# Patient Record
Sex: Female | Born: 1965 | Race: White | Hispanic: No | Marital: Married | Smoking: Former smoker
Health system: Southern US, Community
[De-identification: ages and names within clinical notes are randomized; demographics above are authoritative.]

## PROBLEM LIST (undated history)

## (undated) DIAGNOSIS — N6019 Diffuse cystic mastopathy of unspecified breast: Secondary | ICD-10-CM

## (undated) DIAGNOSIS — D649 Anemia, unspecified: Secondary | ICD-10-CM

## (undated) DIAGNOSIS — I340 Nonrheumatic mitral (valve) insufficiency: Secondary | ICD-10-CM

## (undated) DIAGNOSIS — G709 Myoneural disorder, unspecified: Secondary | ICD-10-CM

## (undated) DIAGNOSIS — I8390 Asymptomatic varicose veins of unspecified lower extremity: Secondary | ICD-10-CM

## (undated) DIAGNOSIS — R011 Cardiac murmur, unspecified: Secondary | ICD-10-CM

## (undated) HISTORY — DX: Nonrheumatic mitral (valve) insufficiency: I34.0

## (undated) HISTORY — DX: Diffuse cystic mastopathy of unspecified breast: N60.19

## (undated) HISTORY — PX: BREAST LUMPECTOMY: SHX2

## (undated) HISTORY — PX: BREAST BIOPSY: SHX20

## (undated) HISTORY — PX: VARICOSE VEIN SURGERY: SHX832

## (undated) HISTORY — PX: WISDOM TOOTH EXTRACTION: SHX21

---

## 1997-09-30 ENCOUNTER — Other Ambulatory Visit: Admission: RE | Admit: 1997-09-30 | Discharge: 1997-09-30 | Payer: Self-pay | Admitting: Family Medicine

## 1998-12-22 ENCOUNTER — Other Ambulatory Visit: Admission: RE | Admit: 1998-12-22 | Discharge: 1998-12-22 | Payer: Self-pay | Admitting: Obstetrics and Gynecology

## 2000-04-30 ENCOUNTER — Other Ambulatory Visit: Admission: RE | Admit: 2000-04-30 | Discharge: 2000-04-30 | Payer: Self-pay | Admitting: Obstetrics and Gynecology

## 2000-09-12 ENCOUNTER — Other Ambulatory Visit: Admission: RE | Admit: 2000-09-12 | Discharge: 2000-09-12 | Payer: Self-pay | Admitting: Obstetrics and Gynecology

## 2001-07-16 ENCOUNTER — Other Ambulatory Visit: Admission: RE | Admit: 2001-07-16 | Discharge: 2001-07-16 | Payer: Self-pay | Admitting: Obstetrics and Gynecology

## 2002-10-22 ENCOUNTER — Other Ambulatory Visit: Admission: RE | Admit: 2002-10-22 | Discharge: 2002-10-22 | Payer: Self-pay | Admitting: Obstetrics and Gynecology

## 2002-11-18 ENCOUNTER — Encounter: Payer: Self-pay | Admitting: General Surgery

## 2002-11-18 ENCOUNTER — Encounter: Admission: RE | Admit: 2002-11-18 | Discharge: 2002-11-18 | Payer: Self-pay | Admitting: General Surgery

## 2002-12-09 ENCOUNTER — Ambulatory Visit (HOSPITAL_BASED_OUTPATIENT_CLINIC_OR_DEPARTMENT_OTHER): Admission: RE | Admit: 2002-12-09 | Discharge: 2002-12-09 | Payer: Self-pay | Admitting: General Surgery

## 2002-12-09 ENCOUNTER — Ambulatory Visit (HOSPITAL_COMMUNITY): Admission: RE | Admit: 2002-12-09 | Discharge: 2002-12-09 | Payer: Self-pay | Admitting: General Surgery

## 2003-10-28 ENCOUNTER — Other Ambulatory Visit: Admission: RE | Admit: 2003-10-28 | Discharge: 2003-10-28 | Payer: Self-pay | Admitting: Obstetrics and Gynecology

## 2004-01-11 ENCOUNTER — Encounter: Admission: RE | Admit: 2004-01-11 | Discharge: 2004-01-11 | Payer: Self-pay | Admitting: Family Medicine

## 2004-07-17 ENCOUNTER — Emergency Department (HOSPITAL_COMMUNITY): Admission: EM | Admit: 2004-07-17 | Discharge: 2004-07-17 | Payer: Self-pay | Admitting: Family Medicine

## 2004-12-27 ENCOUNTER — Other Ambulatory Visit: Admission: RE | Admit: 2004-12-27 | Discharge: 2004-12-27 | Payer: Self-pay | Admitting: Obstetrics and Gynecology

## 2006-04-15 ENCOUNTER — Emergency Department (HOSPITAL_COMMUNITY): Admission: EM | Admit: 2006-04-15 | Discharge: 2006-04-15 | Payer: Self-pay | Admitting: Family Medicine

## 2010-10-18 ENCOUNTER — Other Ambulatory Visit: Payer: Self-pay | Admitting: Obstetrics and Gynecology

## 2011-09-17 ENCOUNTER — Other Ambulatory Visit (HOSPITAL_COMMUNITY): Payer: Self-pay | Admitting: Legal Medicine

## 2011-09-17 ENCOUNTER — Ambulatory Visit (HOSPITAL_COMMUNITY): Payer: 59 | Attending: Cardiology | Admitting: Radiology

## 2011-09-17 DIAGNOSIS — R011 Cardiac murmur, unspecified: Secondary | ICD-10-CM

## 2011-09-17 DIAGNOSIS — I059 Rheumatic mitral valve disease, unspecified: Secondary | ICD-10-CM | POA: Insufficient documentation

## 2011-09-17 NOTE — Progress Notes (Signed)
Echocardiogram performed.  

## 2011-11-14 ENCOUNTER — Encounter: Payer: Self-pay | Admitting: Cardiovascular Disease

## 2011-11-14 ENCOUNTER — Encounter: Payer: Self-pay | Admitting: *Deleted

## 2011-11-14 ENCOUNTER — Ambulatory Visit (INDEPENDENT_AMBULATORY_CARE_PROVIDER_SITE_OTHER): Payer: 59 | Admitting: Cardiovascular Disease

## 2011-11-14 VITALS — BP 120/75 | HR 67 | Ht 69.0 in | Wt 148.0 lb

## 2011-11-14 DIAGNOSIS — I340 Nonrheumatic mitral (valve) insufficiency: Secondary | ICD-10-CM | POA: Insufficient documentation

## 2011-11-14 DIAGNOSIS — I059 Rheumatic mitral valve disease, unspecified: Secondary | ICD-10-CM

## 2011-11-14 NOTE — Progress Notes (Signed)
   History of Present Illness: 46 yo WF with history of fibrocystic breast disease but no other chronic medical problems who is here today for new patient evaluation. She has no history of DM, HTN, HLD. She exercises several times per week, walking or jogging a few miles. She was being evaluated in primary care this summer and a heart murmur was noticed. Echo 09/17/11 with mild MR, normal LV size and function.   She tells me that she feels great. She has no chest pain, SOB, dizziness, near syncope, syncope, palpitations.   Primary Care Physician: [place]  Past Medical History  Diagnosis Date  . Fibrocystic breast disease   . Mitral regurgitation     Mild by echo 09/17/11.    Past Surgical History  Procedure Date  . Breast lumpectomy     Fibroadenoma of the left breast  . Breast biopsy   . Breast biopsy   . Varicose vein surgery     with lasor R-leg    No current outpatient prescriptions on file.  No meds  No Known Allergies  History   Social History  . Marital Status: Married    Spouse Name: N/A    Number of Children: 2  . Years of Education: N/A   Occupational History  . Gaffer    Social History Main Topics  . Smoking status: Former Smoker -- 1.0 packs/day for 20 years    Types: Cigarettes    Quit date: 03/19/2002  . Smokeless tobacco: Not on file  . Alcohol Use: No  . Drug Use: No  . Sexually Active: Not on file   Other Topics Concern  . Not on file   Social History Narrative  . No narrative on file    Family History  Problem Relation Age of Onset  . Cancer Father     Lung or esphageal  . Hypertension Mother   . Diabetes Mother   . Coronary artery disease Paternal Uncle     Review of Systems:  As stated in the HPI and otherwise negative.   BP 120/75  Pulse 67  Ht 5\' 9"  (1.753 m)  Wt 148 lb (67.132 kg)  BMI 21.86 kg/m2  Physical Examination: General: Well developed, well nourished, NAD HEENT: OP clear, mucus membranes  moist SKIN: warm, dry. No rashes. Neuro: No focal deficits Musculoskeletal: Muscle strength 5/5 all ext Psychiatric: Mood and affect normal Neck: No JVD, no carotid bruits, no thyromegaly, no lymphadenopathy. Lungs:Clear bilaterally, no wheezes, rhonci, crackles Cardiovascular: Regular rate and rhythm. Soft systolic murmur. no gallops or rubs. Abdomen:Soft. Bowel sounds present. Non-tender.  Extremities: No lower extremity edema. Pulses are 2 + in the bilateral DP/PT.  EKG: Normal sinus, rate 67 bpm. Normal EKG.   Echo 09/17/11: Left ventricle: The cavity size was normal. Wall thickness was normal. Systolic function was normal. The estimated ejection fraction was in the range of 55% to 60%. Wall motion was normal; there were no regional wall motion abnormalities. Left ventricular diastolic function parameters were normal. - Mitral valve: Mild regurgitation.    Assessment and Plan:   1. Mitral Regurgitation: Mild by echo 09/17/11. No further workup at this time. I have recommended 2 year f/u with echo at that time. No antibiotic prophylaxis is necessary prior to dental procedures.

## 2011-11-14 NOTE — Patient Instructions (Addendum)
Your physician wants you to follow-up in:  2 years. You will receive a reminder letter in the mail two months in advance. If you don't receive a letter, please call our office to schedule the follow-up appointment.   

## 2011-12-14 ENCOUNTER — Other Ambulatory Visit: Payer: Self-pay | Admitting: Dermatology

## 2013-03-06 ENCOUNTER — Other Ambulatory Visit: Payer: Self-pay | Admitting: Obstetrics and Gynecology

## 2014-03-10 ENCOUNTER — Other Ambulatory Visit: Payer: Self-pay | Admitting: Obstetrics and Gynecology

## 2014-03-11 LAB — CYTOLOGY - PAP

## 2015-06-14 ENCOUNTER — Other Ambulatory Visit: Payer: Self-pay | Admitting: Obstetrics and Gynecology

## 2015-06-15 LAB — CYTOLOGY - PAP

## 2015-09-23 ENCOUNTER — Other Ambulatory Visit (HOSPITAL_COMMUNITY): Payer: Self-pay

## 2015-09-29 ENCOUNTER — Ambulatory Visit (HOSPITAL_COMMUNITY): Payer: BLUE CROSS/BLUE SHIELD | Attending: Internal Medicine

## 2015-09-29 ENCOUNTER — Encounter (INDEPENDENT_AMBULATORY_CARE_PROVIDER_SITE_OTHER): Payer: Self-pay

## 2015-09-29 ENCOUNTER — Other Ambulatory Visit: Payer: Self-pay

## 2015-09-29 ENCOUNTER — Other Ambulatory Visit (HOSPITAL_COMMUNITY): Payer: Self-pay | Admitting: Nurse Practitioner

## 2015-09-29 DIAGNOSIS — Z87891 Personal history of nicotine dependence: Secondary | ICD-10-CM | POA: Diagnosis not present

## 2015-09-29 DIAGNOSIS — I340 Nonrheumatic mitral (valve) insufficiency: Secondary | ICD-10-CM

## 2015-09-29 DIAGNOSIS — Z8249 Family history of ischemic heart disease and other diseases of the circulatory system: Secondary | ICD-10-CM | POA: Diagnosis not present

## 2015-12-23 ENCOUNTER — Other Ambulatory Visit: Payer: Self-pay | Admitting: Obstetrics and Gynecology

## 2015-12-28 ENCOUNTER — Encounter (HOSPITAL_COMMUNITY): Payer: Self-pay | Admitting: *Deleted

## 2016-01-04 ENCOUNTER — Other Ambulatory Visit: Payer: Self-pay | Admitting: Obstetrics and Gynecology

## 2016-01-12 NOTE — Patient Instructions (Signed)
Your procedure is scheduled on:  Thursday, Nov. 9, 2017  Enter through the [employer] [facility] at:  10:00 AM  Pick up the phone at the desk and dial [phone].  Call this number if you have problems the morning of surgery: [phone].  Remember: Do NOT eat food or  drink after:  Midnight Wednesday, Nov. 8, 2017  Take these medicines the morning of surgery with a SIP OF WATER:  None  Stop ALL herbal medications at this time   Do NOT wear jewelry (body piercing), metal hair clips/bobby pins, make-up, or nail polish. Do NOT wear lotions, powders, or perfumes.  You may wear deodorant. Do NOT shave for 48 hours prior to surgery. Do NOT bring valuables to the hospital. Contacts, dentures, or bridgework may not be worn into surgery.  Have a responsible adult drive you home and stay with you for 24 hours after your procedure

## 2016-01-13 ENCOUNTER — Encounter (HOSPITAL_COMMUNITY): Payer: Self-pay

## 2016-01-13 ENCOUNTER — Encounter (HOSPITAL_COMMUNITY)
Admission: RE | Admit: 2016-01-13 | Discharge: 2016-01-13 | Disposition: A | Discharge status: Home / Self Care | Payer: BLUE CROSS/BLUE SHIELD | Source: Ambulatory Visit | Attending: Obstetrics and Gynecology | Admitting: Obstetrics and Gynecology

## 2016-01-13 DIAGNOSIS — Z01812 Encounter for preprocedural laboratory examination: Secondary | ICD-10-CM | POA: Insufficient documentation

## 2016-01-13 HISTORY — DX: Asymptomatic varicose veins of unspecified lower extremity: I83.90

## 2016-01-13 HISTORY — DX: Anemia, unspecified: D64.9

## 2016-01-13 LAB — CBC
HCT: 27.3 % — ABNORMAL LOW (ref 36.0–46.0)
Hemoglobin: 8.2 g/dL — ABNORMAL LOW (ref 12.0–15.0)
MCH: 22.4 pg — ABNORMAL LOW (ref 26.0–34.0)
MCHC: 30 g/dL (ref 30.0–36.0)
MCV: 74.6 fL — ABNORMAL LOW (ref 78.0–100.0)
Platelets: 345 10*3/uL (ref 150–400)
RBC: 3.66 MIL/uL — ABNORMAL LOW (ref 3.87–5.11)
RDW: 17.3 % — ABNORMAL HIGH (ref 11.5–15.5)
WBC: 8.1 10*3/uL (ref 4.0–10.5)

## 2016-01-13 NOTE — Pre-Procedure Instructions (Signed)
Left message for Stacy at Dr. Elvis Coil office in reference to hemoglobin levels at pre op appointment.

## 2016-01-16 NOTE — Progress Notes (Signed)
Stacy stated Dr. Rogue Bussing aware of hemoglobin.  Ms. Figgers has resumed taking her iron supplement.

## 2016-01-26 ENCOUNTER — Ambulatory Visit (HOSPITAL_COMMUNITY): Payer: BLUE CROSS/BLUE SHIELD | Admitting: Anesthesiology

## 2016-01-26 ENCOUNTER — Encounter (HOSPITAL_COMMUNITY)
Admission: RE | Disposition: A | Discharge status: Home / Self Care | Payer: Self-pay | Source: Ambulatory Visit | Attending: Obstetrics and Gynecology

## 2016-01-26 ENCOUNTER — Encounter (HOSPITAL_COMMUNITY): Payer: Self-pay | Admitting: Anesthesiology

## 2016-01-26 ENCOUNTER — Ambulatory Visit (HOSPITAL_COMMUNITY)
Admission: RE | Admit: 2016-01-26 | Discharge: 2016-01-26 | Disposition: A | Discharge status: Home / Self Care | Payer: BLUE CROSS/BLUE SHIELD | Source: Ambulatory Visit | Attending: Obstetrics and Gynecology | Admitting: Obstetrics and Gynecology

## 2016-01-26 DIAGNOSIS — N920 Excessive and frequent menstruation with regular cycle: Secondary | ICD-10-CM | POA: Diagnosis not present

## 2016-01-26 DIAGNOSIS — D649 Anemia, unspecified: Secondary | ICD-10-CM | POA: Insufficient documentation

## 2016-01-26 DIAGNOSIS — I739 Peripheral vascular disease, unspecified: Secondary | ICD-10-CM | POA: Insufficient documentation

## 2016-01-26 DIAGNOSIS — N938 Other specified abnormal uterine and vaginal bleeding: Secondary | ICD-10-CM | POA: Diagnosis not present

## 2016-01-26 DIAGNOSIS — N840 Polyp of corpus uteri: Secondary | ICD-10-CM | POA: Insufficient documentation

## 2016-01-26 DIAGNOSIS — Z87891 Personal history of nicotine dependence: Secondary | ICD-10-CM | POA: Insufficient documentation

## 2016-01-26 DIAGNOSIS — I340 Nonrheumatic mitral (valve) insufficiency: Secondary | ICD-10-CM | POA: Insufficient documentation

## 2016-01-26 DIAGNOSIS — R011 Cardiac murmur, unspecified: Secondary | ICD-10-CM | POA: Diagnosis not present

## 2016-01-26 HISTORY — PX: HYSTEROSCOPY WITH NOVASURE: SHX5574

## 2016-01-26 HISTORY — PX: DILATATION & CURETTAGE/HYSTEROSCOPY WITH MYOSURE: SHX6511

## 2016-01-26 SURGERY — DILATATION & CURETTAGE/HYSTEROSCOPY WITH MYOSURE
Anesthesia: General | Site: Vagina

## 2016-01-26 MED ORDER — SODIUM CHLORIDE 0.9 % IR SOLN
Status: DC | PRN
  Administered 2016-01-26: 3000 mL

## 2016-01-26 MED ORDER — SCOPOLAMINE 1 MG/3DAYS TD PT72
MEDICATED_PATCH | TRANSDERMAL | Status: AC
  Administered 2016-01-26: 1.5 mg via TRANSDERMAL
  Filled 2016-01-26: qty 1

## 2016-01-26 MED ORDER — FENTANYL CITRATE (PF) 100 MCG/2ML IJ SOLN
INTRAMUSCULAR | Status: DC | PRN
  Administered 2016-01-26 (×2): 50 ug via INTRAVENOUS

## 2016-01-26 MED ORDER — FENTANYL CITRATE (PF) 100 MCG/2ML IJ SOLN
INTRAMUSCULAR | Status: AC
  Filled 2016-01-26: qty 2

## 2016-01-26 MED ORDER — ONDANSETRON HCL 4 MG/2ML IJ SOLN: 4.0000 mg | Freq: Four times a day (QID) | INTRAMUSCULAR | Status: DC | PRN

## 2016-01-26 MED ORDER — ONDANSETRON HCL 4 MG/2ML IJ SOLN
INTRAMUSCULAR | Status: AC
  Filled 2016-01-26: qty 2

## 2016-01-26 MED ORDER — DEXAMETHASONE SODIUM PHOSPHATE 4 MG/ML IJ SOLN
INTRAMUSCULAR | Status: AC
  Filled 2016-01-26: qty 1

## 2016-01-26 MED ORDER — LIDOCAINE HCL (CARDIAC) 20 MG/ML IV SOLN
INTRAVENOUS | Status: DC | PRN
  Administered 2016-01-26: 30 mg via INTRAVENOUS
  Administered 2016-01-26: 70 mg via INTRAVENOUS

## 2016-01-26 MED ORDER — LIDOCAINE HCL 1 % IJ SOLN
INTRAMUSCULAR | Status: DC | PRN
  Administered 2016-01-26: 10 mL

## 2016-01-26 MED ORDER — PHENYLEPHRINE 40 MCG/ML (10ML) SYRINGE FOR IV PUSH (FOR BLOOD PRESSURE SUPPORT)
PREFILLED_SYRINGE | INTRAVENOUS | Status: AC
  Filled 2016-01-26: qty 10

## 2016-01-26 MED ORDER — KETOROLAC TROMETHAMINE 30 MG/ML IJ SOLN
INTRAMUSCULAR | Status: AC
  Filled 2016-01-26: qty 1

## 2016-01-26 MED ORDER — PROPOFOL 10 MG/ML IV BOLUS
INTRAVENOUS | Status: DC | PRN
  Administered 2016-01-26: 170 mg via INTRAVENOUS

## 2016-01-26 MED ORDER — MIDAZOLAM HCL 2 MG/2ML IJ SOLN
INTRAMUSCULAR | Status: AC
  Filled 2016-01-26: qty 2

## 2016-01-26 MED ORDER — LACTATED RINGERS IV SOLN
INTRAVENOUS | Status: DC
  Administered 2016-01-26: 10:00:00 via INTRAVENOUS

## 2016-01-26 MED ORDER — LIDOCAINE HCL 1 % IJ SOLN
INTRAMUSCULAR | Status: AC
  Filled 2016-01-26: qty 20

## 2016-01-26 MED ORDER — FENTANYL CITRATE (PF) 100 MCG/2ML IJ SOLN: 25.0000 ug | INTRAMUSCULAR | Status: DC | PRN

## 2016-01-26 MED ORDER — KETOROLAC TROMETHAMINE 30 MG/ML IJ SOLN
INTRAMUSCULAR | Status: DC | PRN
  Administered 2016-01-26: 30 mg via INTRAVENOUS

## 2016-01-26 MED ORDER — PHENYLEPHRINE HCL 10 MG/ML IJ SOLN
INTRAMUSCULAR | Status: DC | PRN
  Administered 2016-01-26 (×2): 40 ug via INTRAVENOUS
  Administered 2016-01-26: 120 ug via INTRAVENOUS

## 2016-01-26 MED ORDER — PROPOFOL 10 MG/ML IV BOLUS
INTRAVENOUS | Status: AC
  Filled 2016-01-26: qty 20

## 2016-01-26 MED ORDER — DEXAMETHASONE SODIUM PHOSPHATE 10 MG/ML IJ SOLN
INTRAMUSCULAR | Status: DC | PRN
  Administered 2016-01-26: 4 mg via INTRAVENOUS

## 2016-01-26 MED ORDER — OXYCODONE-ACETAMINOPHEN 5-325 MG PO TABS: 1.0000 | ORAL_TABLET | Freq: Four times a day (QID) | ORAL | 0 refills | Status: DC | PRN

## 2016-01-26 MED ORDER — MIDAZOLAM HCL 2 MG/2ML IJ SOLN
INTRAMUSCULAR | Status: DC | PRN
  Administered 2016-01-26: 1 mg via INTRAVENOUS

## 2016-01-26 MED ORDER — OXYCODONE-ACETAMINOPHEN 5-325 MG PO TABS: 1.0000 | ORAL_TABLET | Freq: Four times a day (QID) | ORAL | Status: DC | PRN

## 2016-01-26 MED ORDER — SCOPOLAMINE 1 MG/3DAYS TD PT72
1.0000 | MEDICATED_PATCH | Freq: Once | TRANSDERMAL | Status: DC
  Administered 2016-01-26: 1.5 mg via TRANSDERMAL

## 2016-01-26 MED ORDER — LIDOCAINE HCL (CARDIAC) 20 MG/ML IV SOLN
INTRAVENOUS | Status: AC
  Filled 2016-01-26: qty 5

## 2016-01-26 MED ORDER — OXYCODONE HCL 5 MG PO TABS: 5.0000 mg | ORAL_TABLET | Freq: Once | ORAL | Status: DC | PRN

## 2016-01-26 MED ORDER — OXYCODONE HCL 5 MG/5ML PO SOLN
5.0000 mg | Freq: Once | ORAL | Status: DC | PRN
Start: 2016-01-26 — End: 2016-01-26

## 2016-01-26 MED ORDER — ONDANSETRON HCL 4 MG/2ML IJ SOLN
INTRAMUSCULAR | Status: DC | PRN
  Administered 2016-01-26: 4 mg via INTRAVENOUS

## 2016-01-26 SURGICAL SUPPLY — 21 items
ABLATOR ENDOMETRIAL BIPOLAR (ABLATOR) ×2 IMPLANT
CANISTER SUCT 3000ML (MISCELLANEOUS) ×5 IMPLANT
CATH ROBINSON RED A/P 16FR (CATHETERS) ×3 IMPLANT
CLOTH BEACON ORANGE TIMEOUT ST (SAFETY) ×3 IMPLANT
CONTAINER PREFILL 10% NBF 60ML (FORM) ×6 IMPLANT
DEVICE MYOSURE LITE (MISCELLANEOUS) IMPLANT
DEVICE MYOSURE REACH (MISCELLANEOUS) ×2 IMPLANT
FILTER ARTHROSCOPY CONVERTOR (FILTER) ×3 IMPLANT
GLOVE BIOGEL PI IND STRL 6.5 (GLOVE) ×2 IMPLANT
GLOVE BIOGEL PI IND STRL 7.0 (GLOVE) ×1 IMPLANT
GLOVE BIOGEL PI INDICATOR 6.5 (GLOVE) ×4
GLOVE BIOGEL PI INDICATOR 7.0 (GLOVE) ×2
GLOVE ECLIPSE 6.5 STRL STRAW (GLOVE) ×3 IMPLANT
GOWN STRL REUS W/TWL LRG LVL3 (GOWN DISPOSABLE) ×6 IMPLANT
PACK VAGINAL MINOR WOMEN LF (CUSTOM PROCEDURE TRAY) ×3 IMPLANT
PAD OB MATERNITY 4.3X12.25 (PERSONAL CARE ITEMS) ×3 IMPLANT
SEAL ROD LENS SCOPE MYOSURE (ABLATOR) ×2 IMPLANT
TOWEL OR 17X24 6PK STRL BLUE (TOWEL DISPOSABLE) ×6 IMPLANT
TUBING AQUILEX INFLOW (TUBING) ×3 IMPLANT
TUBING AQUILEX OUTFLOW (TUBING) ×3 IMPLANT
WATER STERILE IRR 1000ML POUR (IV SOLUTION) ×3 IMPLANT

## 2016-01-26 NOTE — Discharge Summary (Signed)
Pt presented for scheduled dilation, hysteroscopy, myosure/polypectomy and novasure.  See op note.  Pt discharged with instructions to take ibuprofen for pain, percocet for severe pain (Rx provided #6).  F/u office 2 weeks.

## 2016-01-26 NOTE — H&P (Signed)
50 y.o. presents for scheduled D&C hysteroscopy, myosure, novasure for menorrhagia.  Recent US shows possible 3cm polyp within endometrial canal, EMB benign.  Past Medical History:  Diagnosis Date  . Anemia   . Fibrocystic breast disease   . Mitral regurgitation    Mild by echo 09/17/11.  . Varicose vein of leg    Past Surgical History:  Procedure Laterality Date  . BREAST BIOPSY    . BREAST BIOPSY    . BREAST LUMPECTOMY     Fibroadenoma of the left breast  . VARICOSE VEIN SURGERY     with lasor R-leg  . WISDOM TOOTH EXTRACTION      Social History   Social History  . Marital status: Married    Spouse name: N/A  . Number of children: 2  . Years of education: N/A   Occupational History  . Biochemist, clinical    Social History Main Topics  . Smoking status: Former Smoker    Packs/day: 1.00    Years: 20.00    Types: Cigarettes    Quit date: 03/19/2002  . Smokeless tobacco: Never Used  . Alcohol use No  . Drug use: No  . Sexual activity: Yes    Birth control/ protection: None   Other Topics Concern  . Not on file   Social History Narrative  . No narrative on file    No current facility-administered medications on file prior to encounter.    No current outpatient prescriptions on file prior to encounter.    No Known Allergies  @VITALS2 @  Lungs: clear to ascultation Cor:  RRR Abdomen:  soft, nontender, nondistended. Ex:  no cords, erythema Pelvic:  defierred to OR  A:  Admit for procedure listed above.   P: All risks, benefits and alternatives d/w patient and she desires to proceed. No abx needed.  Allyn Kenner

## 2016-01-26 NOTE — Anesthesia Preprocedure Evaluation (Signed)
Anesthesia Evaluation  Patient identified by MRN, date of birth, ID band Patient awake    Reviewed: Allergy & Precautions, H&P , NPO status , Patient's Chart, lab work & pertinent test results  Airway Mallampati: II   Neck ROM: full    Dental   Pulmonary former smoker,    breath sounds clear to auscultation       Cardiovascular + Peripheral Vascular Disease  + Valvular Problems/Murmurs MR  Rhythm:regular Rate:Normal  Mild MR   Neuro/Psych    GI/Hepatic   Endo/Other    Renal/GU      Musculoskeletal   Abdominal   Peds  Hematology  (+) anemia ,   Anesthesia Other Findings   Reproductive/Obstetrics                             Anesthesia Physical Anesthesia Plan  ASA: II  Anesthesia Plan: General   Post-op Pain Management:    Induction: Intravenous  Airway Management Planned: LMA  Additional Equipment:   Intra-op Plan:   Post-operative Plan:   Informed Consent: I have reviewed the patients History and Physical, chart, labs and discussed the procedure including the risks, benefits and alternatives for the proposed anesthesia with the patient or authorized representative who has indicated his/her understanding and acceptance.     Plan Discussed with: CRNA, Anesthesiologist and Surgeon  Anesthesia Plan Comments:         Anesthesia Quick Evaluation

## 2016-01-26 NOTE — Anesthesia Procedure Notes (Signed)
Procedure Name: LMA Insertion Date/Time: 01/26/2016 11:35 AM Performed by: Tobin Chad Pre-anesthesia Checklist: Patient identified, Emergency Drugs available, Suction available, Patient being monitored and Timeout performed Patient Re-evaluated:Patient Re-evaluated prior to inductionOxygen Delivery Method: Circle system utilized and Simple face mask Preoxygenation: Pre-oxygenation with 100% oxygen Intubation Type: IV induction LMA: LMA inserted LMA Size: 4.0 Grade View: Grade II Tube size: 7.0 mm Number of attempts: 1 Placement Confirmation: positive ETCO2 and breath sounds checked- equal and bilateral Dental Injury: Teeth and Oropharynx as per pre-operative assessment

## 2016-01-26 NOTE — Brief Op Note (Signed)
01/26/2016  12:12 PM  PATIENT:  Janet [name]  50 y.o. female  PRE-OPERATIVE DIAGNOSIS:  MENORRHAGIA  POST-OPERATIVE DIAGNOSIS:  MENORRHAGIA  PROCEDURE:  Dilation, hysteroscopy, myosure/polypectomy and novasure  SURGEON:  Surgeon(s) and Role:    Allyn Kenner, DO - Primary  ANESTHESIA:   general and paracervical block  EBL:  Total I/O In: 700 [I.V.:700] Out: 55 [Urine:50; Blood:5]  LOCAL MEDICATIONS USED:  LIDOCAINE  and Amount: 10 ml  SPECIMEN:  Source of Specimen:  endometrial polyp  DISPOSITION OF SPECIMEN:  PATHOLOGY  COUNTS:  YES  TOURNIQUET:  * No tourniquets in log *  PLAN OF CARE: Discharge to home after PACU  PATIENT DISPOSITION:  PACU - hemodynamically stable.   Delay start of Pharmacological VTE agent (>24hrs) due to surgical blood loss or risk of bleeding: no

## 2016-01-26 NOTE — Discharge Instructions (Signed)

## 2016-01-26 NOTE — Transfer of Care (Signed)
Immediate Anesthesia Transfer of Care Note  Patient: Janet [name]  Procedure(s) Performed: Procedure(s): DILATATION & /HYSTEROSCOPY WITH MYOSURE (N/A) HYSTEROSCOPY WITH NOVASURE (N/A)  Patient Location: PACU  Anesthesia Type:General  Level of Consciousness: awake, alert  and oriented  Airway & Oxygen Therapy: Patient Spontanous Breathing and Patient connected to nasal cannula oxygen  Post-op Assessment: Report given to RN and Post -op Vital signs reviewed and stable  Post vital signs: Reviewed and stable  Last Vitals:  Vitals:   01/26/16 1009  BP: 115/78  Pulse: 77  Resp: 16  Temp: 36.8 C    Last Pain:  Vitals:   01/26/16 1009  TempSrc: Oral      Patients Stated Pain Goal: 3 ([id number])  Complications: No apparent anesthesia complications

## 2016-01-26 NOTE — Anesthesia Postprocedure Evaluation (Signed)
Anesthesia Post Note  Patient: Janet [name]  Procedure(s) Performed: Procedure(s) (LRB): DILATATION & /HYSTEROSCOPY WITH MYOSURE (N/A) HYSTEROSCOPY WITH NOVASURE (N/A)  Anesthesia Post Evaluation   Last Vitals:  Vitals:   01/26/16 1009  BP: 115/78  Pulse: 77  Resp: 16  Temp: 36.8 C    Last Pain:  Vitals:   01/26/16 1009  TempSrc: Oral   Pain Goal: Patients Stated Pain Goal: 3 (01/26/16 1009)               Larry Alcock C

## 2016-01-27 NOTE — Op Note (Deleted)
  The note originally documented on this encounter has been moved the the encounter in which it belongs.  

## 2016-01-27 NOTE — Op Note (Signed)
Janet [name], Janet [name]               ACCOUNT NO.:  [id number]  MEDICAL RECORD NO.:  [id number]  LOCATION:                                FACILITY:  [facility]  PHYSICIAN:  Allyn Kenner, DO    DATE OF BIRTH:  1965-05-19  DATE OF PROCEDURE:  01/26/2016 DATE OF DISCHARGE:                              OPERATIVE REPORT   PREOPERATIVE DIAGNOSIS:  Abnormal uterine bleeding, heaviness of bleeding.  POSTOPERATIVE DIAGNOSIS:  Abnormal uterine bleeding, heaviness of bleeding.  PROCEDURE:  Dilation and hysteroscopy, MyoSure with polypectomy and NovaSure.  ANESTHESIA:  General with paracervical block.  IV FLUIDS:  700 mL.  URINE OUTPUT:  50 mL.  ESTIMATED BLOOD LOSS:  5 mL.  SPECIMENS:  Cord blood.  FLUID LOSS:  Please see anesthesia report.  Local medication used 10 mL of 1% lidocaine paracervically.  SPECIMENS:  Endometrial polyp.  COMPLICATIONS:  None.  CONDITION:  Stable to PACU.  FINDINGS:  Uterus sounded to approximately 7.5 cm, cavity length of 4.5 and width 4.4 cm.  DESCRIPTION OF PROCEDURE:  The patient was taken to the operating room, where anesthesia was administered and found to be adequate.  She was prepped and draped in normal sterile fashion in dorsal lithotomy position.  The speculum was placed in the vagina and the anterior lip of the cervix was grasped with a single-tooth tenaculum.  The cervix was dilated serially to 21, and the MyoSure hysteroscope was entered.  The anterior polyp was visualized as a posterior right polyp adjacent to the right cornua.  No other abnormalities were noted.  The MyoSure device Was deployed and both polyps were removed and sent to pathology.  The hysteroscope was removed, and the uterus measured.  The NovaSure was entered into the endometrial cavity without further dilation.  Findings were noted above.  The NovaSure device was then tested and deployed and endometrial Ablation proceeded for  2 minutes of cautery time.  The NovaSure  was then removed and hysteroscope reentered.  Minimal area of the uterine fundus did not have cautery, but the majority of the endometrial cavity had excellent cautery noted.  The camera was then removed. Tenaculum was removed.  Minimal bleeding was noted at the tenaculum site and resolved with pressure.  The patient tolerated the procedure well.  Sponge, lap, and needle counts were correct x2.  Speculum removed form the vagina.  The patient was taken to recovery in stable condition.          ______________________________ Allyn Kenner, DO     [place]/MEDQ  D:  01/26/2016  T:  01/27/2016  Job:  [id number]

## 2016-01-29 ENCOUNTER — Encounter (HOSPITAL_COMMUNITY): Payer: Self-pay | Admitting: Obstetrics and Gynecology

## 2020-06-27 ENCOUNTER — Other Ambulatory Visit: Payer: Self-pay | Admitting: Radiology

## 2020-06-27 DIAGNOSIS — C50912 Malignant neoplasm of unspecified site of left female breast: Secondary | ICD-10-CM

## 2020-06-28 ENCOUNTER — Encounter: Payer: Self-pay | Admitting: *Deleted

## 2020-06-28 ENCOUNTER — Other Ambulatory Visit: Payer: Self-pay | Admitting: *Deleted

## 2020-06-28 ENCOUNTER — Ambulatory Visit
Admission: RE | Admit: 2020-06-28 | Discharge: 2020-06-28 | Disposition: A | Discharge status: Home / Self Care | Payer: 59 | Source: Ambulatory Visit | Attending: Radiology | Admitting: Radiology

## 2020-06-28 ENCOUNTER — Other Ambulatory Visit: Payer: Self-pay

## 2020-06-28 ENCOUNTER — Telehealth: Payer: Self-pay | Admitting: *Deleted

## 2020-06-28 ENCOUNTER — Inpatient Hospital Stay: Payer: 59 | Attending: Hematology and Oncology | Admitting: Hematology and Oncology

## 2020-06-28 ENCOUNTER — Telehealth: Payer: Self-pay | Admitting: Radiology

## 2020-06-28 VITALS — BP 130/78 | HR 76 | Temp 97.9°F | Resp 18 | Ht 69.5 in | Wt 151.9 lb

## 2020-06-28 DIAGNOSIS — Z5189 Encounter for other specified aftercare: Secondary | ICD-10-CM | POA: Diagnosis not present

## 2020-06-28 DIAGNOSIS — Z170 Estrogen receptor positive status [ER+]: Secondary | ICD-10-CM | POA: Diagnosis not present

## 2020-06-28 DIAGNOSIS — C50512 Malignant neoplasm of lower-outer quadrant of left female breast: Secondary | ICD-10-CM | POA: Diagnosis not present

## 2020-06-28 DIAGNOSIS — Z79899 Other long term (current) drug therapy: Secondary | ICD-10-CM | POA: Insufficient documentation

## 2020-06-28 DIAGNOSIS — Z87891 Personal history of nicotine dependence: Secondary | ICD-10-CM | POA: Diagnosis not present

## 2020-06-28 DIAGNOSIS — Z5111 Encounter for antineoplastic chemotherapy: Secondary | ICD-10-CM | POA: Diagnosis present

## 2020-06-28 DIAGNOSIS — C50912 Malignant neoplasm of unspecified site of left female breast: Secondary | ICD-10-CM

## 2020-06-28 MED ORDER — GADOBUTROL 1 MMOL/ML IV SOLN
7.0000 mL | Freq: Once | INTRAVENOUS | Status: AC | PRN
  Administered 2020-06-28: 7 mL via INTRAVENOUS

## 2020-06-28 MED ORDER — DEXAMETHASONE 4 MG PO TABS: 4.0000 mg | ORAL_TABLET | Freq: Every day | ORAL | 0 refills | Status: DC

## 2020-06-28 MED ORDER — LIDOCAINE-PRILOCAINE 2.5-2.5 % EX CREA: TOPICAL_CREAM | CUTANEOUS | 3 refills | Status: DC

## 2020-06-28 MED ORDER — ONDANSETRON HCL 8 MG PO TABS: 8.0000 mg | ORAL_TABLET | Freq: Two times a day (BID) | ORAL | 1 refills | Status: DC | PRN

## 2020-06-28 MED ORDER — PROCHLORPERAZINE MALEATE 10 MG PO TABS: 10.0000 mg | ORAL_TABLET | Freq: Four times a day (QID) | ORAL | 1 refills | Status: DC | PRN

## 2020-06-28 NOTE — Telephone Encounter (Signed)
error 

## 2020-06-28 NOTE — Assessment & Plan Note (Signed)
06/23/2020: Normal mammogram September 2021.  December 2021: Following trauma to the breast she felt a lump but for variety of reasons work-up was postponed.  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  Pathology and radiology counseling: Discussed with the patient, the details of pathology including the type of breast cancer,the clinical staging, the significance of ER, PR and HER-2/neu receptors and the implications for treatment. After reviewing the pathology in detail, we proceeded to discuss the different treatment options between surgery, radiation, chemotherapy, antiestrogen therapies.  Recommendation based on multidisciplinary tumor board: 1. Neoadjuvant chemotherapy with TCH Perjeta 6 cycles followed by Herceptin Perjeta maintenance versus Kadcyla maintenance (based on response to neoadjuvant chemo) for 1 year 2. Followed by breast conserving surgery if possible with sentinel lymph node study 3. Followed by adjuvant radiation therapy if patient had lumpectomy 4.  Followed by neratinib  Chemotherapy Counseling: I discussed the risks and benefits of chemotherapy including the risks of nausea/ vomiting, risk of infection from low WBC count, fatigue due to chemo or anemia, bruising or bleeding due to low platelets, mouth sores, loss/ change in taste and decreased appetite. Liver and kidney function will be monitored through out chemotherapy as abnormalities in liver and kidney function may be a side effect of treatment. Cardiac dysfunction due to Herceptin and Perjeta and neuropathy risk from Taxotere were discussed in detail. Risk of permanent bone marrow dysfunction due to chemo were also discussed.  Plan: 1. Port placement 2. Echocardiogram 3. Chemotherapy class 4. Breast MRI 5.  CT chest abdomen pelvis and bone scan for staging 6. URCC nausea  study  Patient has appointment for breast MRI today and surgical appointment with Dr. Donne Hazel Return to clinic in 2 weeks to start chemotherapy.

## 2020-06-28 NOTE — Telephone Encounter (Signed)
Spoke with patient to follow up from new patient appointment and assess navigation needs.  Gave appointments for CT/bone scan, echo, chemo class.  Informed patient I will leave contrast for her to pick up tomorrow am.  Patient would also like to get her COVID booster as well and I have sent a schedule message for this. Gave contact information and encouraged her to call with any other questions or concerns.

## 2020-06-28 NOTE — Progress Notes (Signed)
[facility] NOTE  Patient Care Team: Charlynn Court, NP as PCP - General (Nurse Practitioner)  CHIEF COMPLAINTS/PURPOSE OF CONSULTATION:  Newly diagnosed breast cancer  HISTORY OF PRESENTING ILLNESS:  Janet [name] 55 y.o. female is here because of recent diagnosis of left breast cancer.  In September 2021 she had a normal mammogram.  In November and December she had a injury to the breast when her grandson fell on her breast.  Subsequently she felt a knot in the breast which was felt to be a bruise but it did not go away.  Because of her husband's Covid diagnosis that investigation was delayed.  Most recent mammogram revealed a couple of tumors in the breast that were 4 cm in the largest size and total dimension of 7 cm and 3 enlarged lymph nodes 1 of which was biopsy-proven positive.  She was seen urgently for discussion regarding neoadjuvant chemotherapy.  I reviewed her records extensively and collaborated the history with the patient.  SUMMARY OF ONCOLOGIC HISTORY: Oncology History  Malignant neoplasm of lower-outer quadrant of left breast of female, estrogen receptor positive ([place])  06/23/2020 Initial Diagnosis   Normal mammogram September 2021.  December 2021: Following trauma to the breast she felt a lump but for variety of reasons work-up was postponed.  Mammogram revealed 2 tumors measuring 7 cm individually 4 cm and 3 cm, 3 axillary lymph nodes, biopsy revealed grade 3 IDC ER 10%, PR 0%, Ki-67 40%, HER-2 +3+ by IHC, lymph node also positive   06/28/2020 Cancer Staging   Staging form: Breast, AJCC 8th Edition - Clinical stage from 06/28/2020: Stage IIB (cT2, cN1, cM0, G3, ER+, PR-, HER2+) - Signed by Nicholas Lose, MD on 06/28/2020 Stage prefix: Initial diagnosis Histologic grading system: 3 grade system      MEDICAL HISTORY:  Past Medical History:  Diagnosis Date  . Anemia   . Fibrocystic breast disease   . Mitral regurgitation    Mild by echo 09/17/11.   . Varicose vein of leg     SURGICAL HISTORY: Past Surgical History:  Procedure Laterality Date  . BREAST BIOPSY    . BREAST BIOPSY    . BREAST LUMPECTOMY     Fibroadenoma of the left breast  . DILATATION & CURETTAGE/HYSTEROSCOPY WITH MYOSURE N/A 01/26/2016   Procedure: DILATATION & [place] WITH MYOSURE;  Surgeon: Allyn Kenner, DO;  Location: [facility];  Service: Gynecology;  Laterality: N/A;  . HYSTEROSCOPY WITH NOVASURE N/A 01/26/2016   Procedure: HYSTEROSCOPY WITH NOVASURE;  Surgeon: Allyn Kenner, DO;  Location: [facility];  Service: Gynecology;  Laterality: N/A;  . VARICOSE VEIN SURGERY     with lasor R-leg  . WISDOM TOOTH EXTRACTION      SOCIAL HISTORY: Social History   Socioeconomic History  . Marital status: Married    Spouse name: Not on file  . Number of children: 2  . Years of education: Not on file  . Highest education level: Not on file  Occupational History  . Occupation: Biochemist, clinical  Tobacco Use  . Smoking status: Former Smoker    Packs/day: 1.00    Years: 20.00    Pack years: 20.00    Types: Cigarettes    Quit date: 03/19/2002    Years since quitting: 18.2  . Smokeless tobacco: Never Used  Substance and Sexual Activity  . Alcohol use: No  . Drug use: No  . Sexual activity: Yes    Birth control/protection: None  Other Topics Concern  .  Not on file  Social History Narrative  . Not on file   Social Determinants of Health   Financial Resource Strain: Not on file  Food Insecurity: Not on file  Transportation Needs: Not on file  Physical Activity: Not on file  Stress: Not on file  Social Connections: Not on file  Intimate Partner Violence: Not on file    FAMILY HISTORY: Family History  Problem Relation Age of Onset  . Cancer Father        Lung or esphageal  . Hypertension Mother   . Diabetes Mother   . Coronary artery disease Paternal Uncle     ALLERGIES:  has No Known Allergies.  MEDICATIONS:  Current Outpatient Medications   Medication Sig Dispense Refill  . Ferrous Sulfate (IRON SUPPLEMENT PO) Take by mouth as needed.    Marland Kitchen oxyCODONE-acetaminophen (PERCOCET/ROXICET) 5-325 MG tablet Take 1-2 tablets by mouth every 6 (six) hours as needed for severe pain. 6 tablet 0   No current facility-administered medications for this visit.    REVIEW OF SYSTEMS:   Constitutional: Denies fevers, chills or abnormal night sweats    Breast: Palpable nodules in the breast All other systems were reviewed with the patient and are negative.  PHYSICAL EXAMINATION: ECOG PERFORMANCE STATUS: 0 - Asymptomatic  Vitals:   06/28/20 1050  BP: 130/78  Pulse: 76  Resp: 18  Temp: 97.9 F (36.6 C)  SpO2: 99%   Filed Weights   06/28/20 1050  Weight: 151 lb 14.4 oz (68.9 kg)       LABORATORY DATA:  I have reviewed the data as listed Lab Results  Component Value Date   WBC 8.1 01/13/2016   HGB 8.2 (L) 01/13/2016   HCT 27.3 (L) 01/13/2016   MCV 74.6 (L) 01/13/2016   PLT 345 01/13/2016   No results found for: NA, K, CL, CO2  RADIOGRAPHIC STUDIES: I have personally reviewed the radiological reports and agreed with the findings in the report.  ASSESSMENT AND PLAN:  Malignant neoplasm of lower-outer quadrant of left breast of female, estrogen receptor positive ([place]) 06/23/2020: Normal mammogram September 2021.  December 2021: Following trauma to the breast she felt a lump but for variety of reasons work-up was postponed.  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  Pathology and radiology counseling: Discussed with the patient, the details of pathology including the type of breast cancer,the clinical staging, the significance of ER, PR and HER-2/neu receptors and the implications for treatment. After reviewing the pathology in detail, we proceeded to discuss the different treatment  options between surgery, radiation, chemotherapy, antiestrogen therapies.  Recommendation based on multidisciplinary tumor board: 1. Neoadjuvant chemotherapy with TCH Perjeta 6 cycles followed by Herceptin Perjeta maintenance versus Kadcyla maintenance (based on response to neoadjuvant chemo) for 1 year 2. Followed by breast conserving surgery if possible with sentinel lymph node study 3. Followed by adjuvant radiation therapy if patient had lumpectomy 4.  Followed by neratinib  Chemotherapy Counseling: I discussed the risks and benefits of chemotherapy including the risks of nausea/ vomiting, risk of infection from low WBC count, fatigue due to chemo or anemia, bruising or bleeding due to low platelets, mouth sores, loss/ change in taste and decreased appetite. Liver and kidney function will be monitored through out chemotherapy as abnormalities in liver and kidney function may be a  side effect of treatment. Cardiac dysfunction due to Herceptin and Perjeta and neuropathy risk from Taxotere were discussed in detail. Risk of permanent bone marrow dysfunction due to chemo were also discussed.  Plan: 1. Port placement 2. Echocardiogram 3. Chemotherapy class 4. Breast MRI 5.  CT chest abdomen pelvis and bone scan for staging 6. URCC nausea study  Patient has appointment for breast MRI today and surgical appointment with Dr. Donne Hazel Return to clinic in 2 weeks to start chemotherapy.     All questions were answered. The patient knows to call the clinic with any problems, questions or concerns.    Harriette Ohara, MD 06/28/20

## 2020-06-28 NOTE — Progress Notes (Signed)
START ON PATHWAY REGIMEN - Breast     A cycle is every 21 days:     Pertuzumab      Pertuzumab      Trastuzumab-xxxx      Trastuzumab-xxxx      Carboplatin      Docetaxel   **Always confirm dose/schedule in your pharmacy ordering system**  Patient Characteristics: Preoperative or Nonsurgical Candidate (Clinical Staging), Neoadjuvant Therapy followed by Surgery, Invasive Disease, Chemotherapy, HER2 Positive, ER Positive Therapeutic Status: Preoperative or Nonsurgical Candidate (Clinical Staging) AJCC M Category: cM0 AJCC Grade: G3 Breast Surgical Plan: Neoadjuvant Therapy followed by Surgery ER Status: Positive (+) AJCC 8 Stage Grouping: IIB HER2 Status: Positive (+) AJCC T Category: cT2 AJCC N Category: cN1 PR Status: Negative (-) Intent of Therapy: Curative Intent, Discussed with Patient 

## 2020-06-28 NOTE — Telephone Encounter (Signed)
[id number] - TREATMENT OF REFRACTORY NAUSEA  06/28/20   3:25PM  PHONE CALL: Confirmed I was speaking with Janet [name]. Introduced myself as a Scientist, physiological and informed patient the reason for the call was the speak about the above mentioned study that was referred by Dr.Gudena. Described the purpose the study and overall schema to the patient. Patient expressed interest. Patient was given the option to receive a copy of the consent and HIPAA forms via mail or e-mail. Patient preferred to receive via e-mail. This coordinator verified patient's e-mail address in [place] and sent both forms to e-mail address on file. Thanked patient for her time and opportunity to speak with her. Plan is to meet with patient in person after her echo and patient education appointments on 07/11/20. Patient is in agreeance with plan.   Carol Ada, RT(R)(T) Clinical Research Coordinator

## 2020-06-29 ENCOUNTER — Inpatient Hospital Stay: Payer: 59

## 2020-06-29 ENCOUNTER — Telehealth: Payer: Self-pay | Admitting: Hematology and Oncology

## 2020-06-29 ENCOUNTER — Other Ambulatory Visit: Payer: Self-pay | Admitting: *Deleted

## 2020-06-29 DIAGNOSIS — Z23 Encounter for immunization: Secondary | ICD-10-CM

## 2020-06-29 DIAGNOSIS — C50512 Malignant neoplasm of lower-outer quadrant of left female breast: Secondary | ICD-10-CM

## 2020-06-29 NOTE — Telephone Encounter (Signed)
Scheduled appts per 4/12 sch msg. Pt aware.  

## 2020-07-01 ENCOUNTER — Encounter: Payer: Self-pay | Admitting: *Deleted

## 2020-07-05 ENCOUNTER — Encounter: Payer: Self-pay | Admitting: *Deleted

## 2020-07-06 ENCOUNTER — Other Ambulatory Visit: Payer: Self-pay

## 2020-07-06 MED ORDER — PEGFILGRASTIM INJECTION 6 MG/0.6ML ~~LOC~~: 6.0000 mg | PREFILLED_SYRINGE | Freq: Once | SUBCUTANEOUS | 5 refills | Status: DC

## 2020-07-06 NOTE — Progress Notes (Signed)
RN received following message:    Patient has [employer].   Madalyn Rob is non preferred. Kajinti and Frederich Chick are the preferred.   Can this be changed to one of the preferred?    Also, Brendolyn Patty is non preferred. Neulasta and Ziextenzo are the preferred. Either will need to go through [facility], [place].   Verbal orders obtained for Neulasta 6mg  subcutaneously 24 hours post chemotherapy.   RN placed orders to [facility].  Enrollment completed and successfully faxed to [facility] at [phone].

## 2020-07-06 NOTE — Progress Notes (Signed)
The following biosimilar Kanjinti (trastuzumab-anns) has been selected for use in this patient.  Kennith Center, Pharm.D., CPP 07/06/2020@11 :54 AM

## 2020-07-07 ENCOUNTER — Telehealth: Payer: Self-pay | Admitting: *Deleted

## 2020-07-07 ENCOUNTER — Other Ambulatory Visit: Payer: Self-pay | Admitting: Pharmacist

## 2020-07-07 ENCOUNTER — Encounter: Payer: Self-pay | Admitting: *Deleted

## 2020-07-07 ENCOUNTER — Other Ambulatory Visit: Payer: Self-pay | Admitting: General Surgery

## 2020-07-07 NOTE — Telephone Encounter (Signed)
Received call from pt with complaint of lightheadedness and fatigue.  Pt states she did not eat an adequate dinner last night and believes symptoms could be related to low blood sugar combined with stress of recent diagnosis.  Per MD pt needing to increase oral intake and contact our office tomorrow if symptoms do not improve.  RN educated pt on drinking Gatorade, eating small but frequent well balanced meals.  RN also provided emotional support regarding upcoming scans for tomorrow.  Pt appreciative of advice and will update office tomorrow morning if symptoms do not improve.

## 2020-07-07 NOTE — Progress Notes (Signed)
Received message from [facility] team stating pt insurance will now cover pt to receive Neulasta Onpro at our facility.  RN placed call to speciality pharmacy to DC home injection order.  successfully DC'd order.  Total time spent on phone with pharmacy: 23 minutes.

## 2020-07-08 ENCOUNTER — Other Ambulatory Visit (HOSPITAL_COMMUNITY)
Admission: RE | Admit: 2020-07-08 | Discharge: 2020-07-08 | Disposition: A | Discharge status: Home / Self Care | Payer: 59 | Source: Ambulatory Visit | Attending: General Surgery | Admitting: General Surgery

## 2020-07-08 ENCOUNTER — Other Ambulatory Visit: Payer: Self-pay

## 2020-07-08 ENCOUNTER — Ambulatory Visit (HOSPITAL_COMMUNITY)
Admission: RE | Admit: 2020-07-08 | Discharge: 2020-07-08 | Disposition: A | Discharge status: Home / Self Care | Payer: 59 | Source: Ambulatory Visit | Attending: Hematology and Oncology | Admitting: Hematology and Oncology

## 2020-07-08 ENCOUNTER — Encounter: Payer: Self-pay | Admitting: Licensed Clinical Social Worker

## 2020-07-08 DIAGNOSIS — C50512 Malignant neoplasm of lower-outer quadrant of left female breast: Secondary | ICD-10-CM | POA: Diagnosis not present

## 2020-07-08 DIAGNOSIS — R590 Localized enlarged lymph nodes: Secondary | ICD-10-CM | POA: Diagnosis not present

## 2020-07-08 DIAGNOSIS — Z170 Estrogen receptor positive status [ER+]: Secondary | ICD-10-CM | POA: Diagnosis not present

## 2020-07-08 DIAGNOSIS — R160 Hepatomegaly, not elsewhere classified: Secondary | ICD-10-CM | POA: Diagnosis not present

## 2020-07-08 DIAGNOSIS — Z20822 Contact with and (suspected) exposure to covid-19: Secondary | ICD-10-CM | POA: Diagnosis not present

## 2020-07-08 DIAGNOSIS — Z01818 Encounter for other preprocedural examination: Secondary | ICD-10-CM | POA: Diagnosis not present

## 2020-07-08 LAB — SARS CORONAVIRUS 2 (TAT 6-24 HRS): SARS Coronavirus 2: NEGATIVE

## 2020-07-08 MED ORDER — IOHEXOL 300 MG/ML  SOLN
100.0000 mL | Freq: Once | INTRAMUSCULAR | Status: AC | PRN
  Administered 2020-07-08: 100 mL via INTRAVENOUS

## 2020-07-08 MED ORDER — TECHNETIUM TC 99M MEDRONATE IV KIT
21.7000 | PACK | Freq: Once | INTRAVENOUS | Status: AC
  Administered 2020-07-08: 21.7 via INTRAVENOUS

## 2020-07-08 NOTE — Progress Notes (Signed)
[place] Work  [employer] representative contacted patient by phone  to offer support and assess for needs in patient with newly diagnosed breast cancer.  Patient expressed coping through use of her faith, perspective of being ready to move forward and be treated since she has this diagnosis, and support from her husband.  She is working full-time and will determine FMLA/ short-term disability use after seeing how her body feels after her first chemo tx.  No practical/ resource needs at this time.  Pt did express being open to having an Physicist, medical-  Referral made today.    CSW explained other support services and sent copy of May support programs calendar. Provided direct contact information for patient to call should she have any other support services needs.    [place], [place] Worker [employer]

## 2020-07-11 ENCOUNTER — Inpatient Hospital Stay: Payer: 59

## 2020-07-11 ENCOUNTER — Other Ambulatory Visit: Payer: Self-pay

## 2020-07-11 ENCOUNTER — Ambulatory Visit (HOSPITAL_COMMUNITY)
Admission: RE | Admit: 2020-07-11 | Discharge: 2020-07-11 | Disposition: A | Discharge status: Home / Self Care | Payer: 59 | Source: Ambulatory Visit | Attending: Hematology and Oncology | Admitting: Hematology and Oncology

## 2020-07-11 ENCOUNTER — Inpatient Hospital Stay: Payer: 59 | Admitting: Radiology

## 2020-07-11 ENCOUNTER — Encounter: Payer: Self-pay | Admitting: Hematology and Oncology

## 2020-07-11 ENCOUNTER — Telehealth: Payer: Self-pay | Admitting: *Deleted

## 2020-07-11 ENCOUNTER — Encounter: Payer: Self-pay | Admitting: *Deleted

## 2020-07-11 DIAGNOSIS — Z0189 Encounter for other specified special examinations: Secondary | ICD-10-CM | POA: Diagnosis not present

## 2020-07-11 DIAGNOSIS — Z170 Estrogen receptor positive status [ER+]: Secondary | ICD-10-CM

## 2020-07-11 DIAGNOSIS — Z87891 Personal history of nicotine dependence: Secondary | ICD-10-CM | POA: Insufficient documentation

## 2020-07-11 DIAGNOSIS — C50512 Malignant neoplasm of lower-outer quadrant of left female breast: Secondary | ICD-10-CM | POA: Diagnosis not present

## 2020-07-11 LAB — ECHOCARDIOGRAM COMPLETE
Area-P 1/2: 3.65 cm2
Calc EF: 60 %
S' Lateral: 2.8 cm
Single Plane A2C EF: 62.5 %
Single Plane A4C EF: 60.4 %

## 2020-07-11 MED ORDER — GADOBUTROL 1 MMOL/ML IV SOLN
7.0000 mL | Freq: Once | INTRAVENOUS | Status: AC | PRN
  Administered 2020-07-11: 7 mL via INTRAVENOUS

## 2020-07-11 NOTE — Progress Notes (Signed)
.  The following biosimilar Ziextenzo (pegfilgrastim-bmez) has been selected for use in this patient per insurance.  [employer] will now cover long acting [employer] in clinic per [employer] email.  Day 3 added back to treatment plan per Dr Lindi Adie original orders.  Henreitta Leber, PharmD 07/11/20 @ [phone]

## 2020-07-11 NOTE — Progress Notes (Signed)
Met with patient at registration to introduce myself as Arboriculturist and to offer available resources.  Discussed one-time $1000 Radio broadcast assistant to assist with personal expenses while going through treatment. Also, asked if DED/OOP was met to determine if additional available copay assistance would be needed. She is almost certain she has. Advised if she has not, I can apply on her behalf for copay assistance for specific treatment drugs(Kanjunti and Perjeta) if needed and to let me know. She verbalized understanding.   She has my card for any additional financial questions or concerns.

## 2020-07-11 NOTE — Progress Notes (Deleted)
DCP-001: Use of a Clinical Trial Screening Tool to Address Cancer Health Disparities in the [place] [facility] ([facility])  07/11/20    11:30AM  INTRODUCTION: After completing the consent form for [place], introduced the above mentioned study. Briefly reviewed purpose of the study, one time data collection method used for the study, and voluntary nature. Sent a copy of the consent form and HIPAA form home with patient to review. Plan is to speak with patient concerning interest in participating at a later date.   Carol Ada, RT(R)(T) Clinical Research Coordinator

## 2020-07-11 NOTE — Progress Notes (Signed)
  Echocardiogram 2D Echocardiogram has been performed.  Janet [name] 07/11/2020, 9:54 AM

## 2020-07-11 NOTE — Telephone Encounter (Signed)
Spoke with patient to give results from her scans that didn't show any metastatic disease.  She was thrilled with this information. Reviewed appointments and patient verbalized understanding.

## 2020-07-11 NOTE — Progress Notes (Deleted)
[id number] - TREATMENT OF REFRACTORY NAUSEA  07/11/20    11:00AM  CONSENT VISIT: Patient Janet [name] was identified by Dr. Lindi Adie as a potential candidate for the above listed study.  This Clinical Research Coordinator met with Janet [name], Janet [name] on 07/11/20 in a manner and location that ensures patient privacy to discuss participation in the above listed research study.  Patient is Accompanied by her husband.  Patient was previously provided with informed consent documents.  Patient has not yet read the informed consent documents and so documents were reviewed page by page today.  As outlined in the informed consent form, this Coordinator and Janet [name] discussed the purpose of the research study, the investigational nature of the study, study procedures and requirements for study participation, potential risks and benefits of study participation, as well as alternatives to participation.  This study is blinded or double-blinded. The patient understands participation is voluntary and they may withdraw from study participation at any time.  Each study arm was reviewed, and randomization discussed.  Potential side effects were reviewed with patient as outlined in the consent form, and patient made aware there may be side effects not yet known. The chance of receiving placebo was discussed. Patient understands enrollment is pending full eligibility review.   Confidentiality and how the patient's information will be used as part of study participation were discussed.  Patient was informed there is not reimbursement provided for their time and effort spent on trial participation.  The patient is encouraged to discuss research study participation with their insurance provider to determine what costs they may incur as part of study participation, including research related injury.    All questions were answered to patient's satisfaction.  The informed consent and separate HIPAA Authorization  was reviewed page by page.  The patient's mental and emotional status is appropriate to provide informed consent, and the patient verbalizes an understanding of study participation.  Patient has agreed to participate in the above listed research study and has voluntarily signed the informed consent version and separate HIPAA Authorization, version consent version dated 10/03/18, [place] Date 07/14/2019; HIPAA version dated 06/04/2016 on 07/11/20 at 11:21 AM.  The patient was provided with a copy of the signed informed consent form and separate HIPAA Authorization for their reference.  No study specific procedures were obtained prior to the signing of the informed consent document.  Approximately 30 minutes were spent with the patient reviewing the informed consent documents.  Patient was not requested to complete a Release of Information form.Patient verified medications currently prescribed by Dr. Lindi Adie were okay to take. Verified these medications are allowed. Patient had no further questions. This coordinator asked patient if there was any chance of pregnancy. Patient confirmed, no chance of pregnancy and stated she no longer is having periods (per chart, LMP was on 01/09/2016). Patient was thanked for her time and consideration of the above mentioned study.   INITIAL ELIGIBILITY: This Coordinator has reviewed this patient's inclusion and exclusion criteria and confirmed Janet [name] is eligible for study participation.   REGISTRATION: Patient Janet [name], was registered to the above listed study, assigned study#[id number]. Randomization will only occur if patient proceeds to part 2.   Carol Ada, RT(R)(T) Clinical Research Coordinator

## 2020-07-11 NOTE — Progress Notes (Signed)
Gave pt instructions only for pre-op call. Covid test done 07/08/20 and it's negative.  Pt states she's been in quarantine since the test was done and understands that she stays in quarantine until she comes to the hospital tomorrow

## 2020-07-12 ENCOUNTER — Ambulatory Visit (HOSPITAL_COMMUNITY): Payer: 59

## 2020-07-12 ENCOUNTER — Ambulatory Visit (HOSPITAL_COMMUNITY)
Admission: RE | Admit: 2020-07-12 | Discharge: 2020-07-12 | Disposition: A | Discharge status: Home / Self Care | Payer: 59 | Source: Ambulatory Visit | Attending: General Surgery | Admitting: General Surgery

## 2020-07-12 ENCOUNTER — Encounter: Payer: Self-pay | Admitting: Radiology

## 2020-07-12 ENCOUNTER — Ambulatory Visit (HOSPITAL_COMMUNITY): Payer: 59 | Admitting: Anesthesiology

## 2020-07-12 ENCOUNTER — Encounter (HOSPITAL_COMMUNITY): Payer: Self-pay | Admitting: General Surgery

## 2020-07-12 ENCOUNTER — Encounter (HOSPITAL_COMMUNITY)
Admission: RE | Disposition: A | Discharge status: Home / Self Care | Payer: Self-pay | Source: Ambulatory Visit | Attending: General Surgery

## 2020-07-12 ENCOUNTER — Other Ambulatory Visit: Payer: Self-pay

## 2020-07-12 DIAGNOSIS — Z170 Estrogen receptor positive status [ER+]: Secondary | ICD-10-CM

## 2020-07-12 DIAGNOSIS — Z419 Encounter for procedure for purposes other than remedying health state, unspecified: Secondary | ICD-10-CM

## 2020-07-12 DIAGNOSIS — Z833 Family history of diabetes mellitus: Secondary | ICD-10-CM | POA: Diagnosis not present

## 2020-07-12 DIAGNOSIS — Z8261 Family history of arthritis: Secondary | ICD-10-CM | POA: Insufficient documentation

## 2020-07-12 DIAGNOSIS — C50612 Malignant neoplasm of axillary tail of left female breast: Secondary | ICD-10-CM | POA: Diagnosis present

## 2020-07-12 DIAGNOSIS — Z8249 Family history of ischemic heart disease and other diseases of the circulatory system: Secondary | ICD-10-CM | POA: Insufficient documentation

## 2020-07-12 DIAGNOSIS — Z87891 Personal history of nicotine dependence: Secondary | ICD-10-CM | POA: Insufficient documentation

## 2020-07-12 DIAGNOSIS — C50512 Malignant neoplasm of lower-outer quadrant of left female breast: Secondary | ICD-10-CM

## 2020-07-12 DIAGNOSIS — Z809 Family history of malignant neoplasm, unspecified: Secondary | ICD-10-CM | POA: Insufficient documentation

## 2020-07-12 DIAGNOSIS — Z8371 Family history of colonic polyps: Secondary | ICD-10-CM | POA: Diagnosis not present

## 2020-07-12 HISTORY — PX: PORTACATH PLACEMENT: SHX2246

## 2020-07-12 LAB — CBC
HCT: 43.7 % (ref 36.0–46.0)
Hemoglobin: 14 g/dL (ref 12.0–15.0)
MCH: 30 pg (ref 26.0–34.0)
MCHC: 32 g/dL (ref 30.0–36.0)
MCV: 93.6 fL (ref 80.0–100.0)
Platelets: 261 10*3/uL (ref 150–400)
RBC: 4.67 MIL/uL (ref 3.87–5.11)
RDW: 12.6 % (ref 11.5–15.5)
WBC: 6 10*3/uL (ref 4.0–10.5)
nRBC: 0 % (ref 0.0–0.2)

## 2020-07-12 SURGERY — INSERTION, TUNNELED CENTRAL VENOUS DEVICE, WITH PORT
Anesthesia: General | Site: Breast | Laterality: Right

## 2020-07-12 MED ORDER — TRAMADOL HCL 50 MG PO TABS: 100.0000 mg | ORAL_TABLET | Freq: Four times a day (QID) | ORAL | 0 refills | Status: DC | PRN

## 2020-07-12 MED ORDER — 0.9 % SODIUM CHLORIDE (POUR BTL) OPTIME
TOPICAL | Status: DC | PRN
  Administered 2020-07-12: 1000 mL

## 2020-07-12 MED ORDER — DEXAMETHASONE SODIUM PHOSPHATE 10 MG/ML IJ SOLN
INTRAMUSCULAR | Status: DC | PRN
  Administered 2020-07-12: 10 mg via INTRAVENOUS

## 2020-07-12 MED ORDER — ACETAMINOPHEN 500 MG PO TABS
1000.0000 mg | ORAL_TABLET | ORAL | Status: AC
  Administered 2020-07-12: 1000 mg via ORAL
  Filled 2020-07-12: qty 2

## 2020-07-12 MED ORDER — CHLORHEXIDINE GLUCONATE 0.12 % MT SOLN
15.0000 mL | Freq: Once | OROMUCOSAL | Status: AC
  Administered 2020-07-12: 15 mL via OROMUCOSAL
  Filled 2020-07-12: qty 15

## 2020-07-12 MED ORDER — MORPHINE SULFATE (PF) 2 MG/ML IV SOLN
1.0000 mg | INTRAVENOUS | Status: DC | PRN
Start: 2020-07-12 — End: 2020-07-12

## 2020-07-12 MED ORDER — EPHEDRINE 5 MG/ML INJ
INTRAVENOUS | Status: AC
  Filled 2020-07-12: qty 10

## 2020-07-12 MED ORDER — ONDANSETRON HCL 4 MG/2ML IJ SOLN: 4.0000 mg | Freq: Once | INTRAMUSCULAR | Status: DC | PRN

## 2020-07-12 MED ORDER — FENTANYL CITRATE (PF) 250 MCG/5ML IJ SOLN
INTRAMUSCULAR | Status: AC
  Filled 2020-07-12: qty 5

## 2020-07-12 MED ORDER — SODIUM CHLORIDE 0.9% FLUSH: 3.0000 mL | Freq: Two times a day (BID) | INTRAVENOUS | Status: DC

## 2020-07-12 MED ORDER — BUPIVACAINE HCL (PF) 0.25 % IJ SOLN
INTRAMUSCULAR | Status: AC
  Filled 2020-07-12: qty 10

## 2020-07-12 MED ORDER — SODIUM CHLORIDE 0.9 % IV SOLN: 250.0000 mL | INTRAVENOUS | Status: DC | PRN

## 2020-07-12 MED ORDER — SODIUM CHLORIDE 0.9% FLUSH: 3.0000 mL | INTRAVENOUS | Status: DC | PRN

## 2020-07-12 MED ORDER — PROPOFOL 10 MG/ML IV BOLUS
INTRAVENOUS | Status: AC
  Filled 2020-07-12: qty 20

## 2020-07-12 MED ORDER — LACTATED RINGERS IV SOLN: INTRAVENOUS | Status: DC

## 2020-07-12 MED ORDER — ENSURE PRE-SURGERY PO LIQD: 296.0000 mL | Freq: Once | ORAL | Status: DC

## 2020-07-12 MED ORDER — OXYCODONE HCL 5 MG/5ML PO SOLN: 5.0000 mg | Freq: Once | ORAL | Status: DC | PRN

## 2020-07-12 MED ORDER — OXYCODONE HCL 5 MG PO TABS: 5.0000 mg | ORAL_TABLET | ORAL | Status: DC | PRN

## 2020-07-12 MED ORDER — SODIUM CHLORIDE 0.9 % IV SOLN
INTRAVENOUS | Status: AC
  Filled 2020-07-12: qty 1.2

## 2020-07-12 MED ORDER — ONDANSETRON HCL 4 MG/2ML IJ SOLN
INTRAMUSCULAR | Status: AC
  Filled 2020-07-12: qty 2

## 2020-07-12 MED ORDER — BUPIVACAINE HCL 0.25 % IJ SOLN
INTRAMUSCULAR | Status: DC | PRN
  Administered 2020-07-12: 7 mL

## 2020-07-12 MED ORDER — DEXAMETHASONE SODIUM PHOSPHATE 10 MG/ML IJ SOLN
INTRAMUSCULAR | Status: AC
  Filled 2020-07-12: qty 1

## 2020-07-12 MED ORDER — MIDAZOLAM HCL 2 MG/2ML IJ SOLN
INTRAMUSCULAR | Status: AC
  Filled 2020-07-12: qty 2

## 2020-07-12 MED ORDER — EPHEDRINE SULFATE-NACL 50-0.9 MG/10ML-% IV SOSY
PREFILLED_SYRINGE | INTRAVENOUS | Status: DC | PRN
  Administered 2020-07-12 (×2): 10 mg via INTRAVENOUS

## 2020-07-12 MED ORDER — HEPARIN SOD (PORK) LOCK FLUSH 100 UNIT/ML IV SOLN
INTRAVENOUS | Status: AC
  Filled 2020-07-12: qty 5

## 2020-07-12 MED ORDER — CEFAZOLIN SODIUM-DEXTROSE 2-4 GM/100ML-% IV SOLN
2.0000 g | INTRAVENOUS | Status: DC
  Filled 2020-07-12: qty 100

## 2020-07-12 MED ORDER — LIDOCAINE 2% (20 MG/ML) 5 ML SYRINGE
INTRAMUSCULAR | Status: DC | PRN
  Administered 2020-07-12: 20 mg via INTRAVENOUS

## 2020-07-12 MED ORDER — ONDANSETRON HCL 4 MG/2ML IJ SOLN
INTRAMUSCULAR | Status: DC | PRN
  Administered 2020-07-12: 4 mg via INTRAVENOUS

## 2020-07-12 MED ORDER — SODIUM CHLORIDE 0.9 % IV SOLN: INTRAVENOUS | Status: DC

## 2020-07-12 MED ORDER — LIDOCAINE 2% (20 MG/ML) 5 ML SYRINGE
INTRAMUSCULAR | Status: AC
  Filled 2020-07-12: qty 5

## 2020-07-12 MED ORDER — ACETAMINOPHEN 325 MG PO TABS: 650.0000 mg | ORAL_TABLET | ORAL | Status: DC | PRN

## 2020-07-12 MED ORDER — HEPARIN SOD (PORK) LOCK FLUSH 100 UNIT/ML IV SOLN
INTRAVENOUS | Status: DC | PRN
  Administered 2020-07-12: 450 [IU]

## 2020-07-12 MED ORDER — FENTANYL CITRATE (PF) 250 MCG/5ML IJ SOLN
INTRAMUSCULAR | Status: DC | PRN
  Administered 2020-07-12: 50 ug via INTRAVENOUS

## 2020-07-12 MED ORDER — FENTANYL CITRATE (PF) 100 MCG/2ML IJ SOLN: 25.0000 ug | INTRAMUSCULAR | Status: DC | PRN

## 2020-07-12 MED ORDER — PROPOFOL 10 MG/ML IV BOLUS
INTRAVENOUS | Status: DC | PRN
  Administered 2020-07-12: 170 mg via INTRAVENOUS

## 2020-07-12 MED ORDER — PHENYLEPHRINE 40 MCG/ML (10ML) SYRINGE FOR IV PUSH (FOR BLOOD PRESSURE SUPPORT)
PREFILLED_SYRINGE | INTRAVENOUS | Status: DC | PRN
  Administered 2020-07-12 (×2): 80 ug via INTRAVENOUS
  Administered 2020-07-12: 120 ug via INTRAVENOUS

## 2020-07-12 MED ORDER — SODIUM CHLORIDE 0.9 % IV SOLN
INTRAVENOUS | Status: DC | PRN
  Administered 2020-07-12: 500 mL

## 2020-07-12 MED ORDER — MIDAZOLAM HCL 5 MG/5ML IJ SOLN
INTRAMUSCULAR | Status: DC | PRN
  Administered 2020-07-12: 2 mg via INTRAVENOUS

## 2020-07-12 MED ORDER — OXYCODONE HCL 5 MG PO TABS: 5.0000 mg | ORAL_TABLET | Freq: Once | ORAL | Status: DC | PRN

## 2020-07-12 MED ORDER — ACETAMINOPHEN 650 MG RE SUPP: 650.0000 mg | RECTAL | Status: DC | PRN

## 2020-07-12 MED ORDER — ORAL CARE MOUTH RINSE: 15.0000 mL | Freq: Once | OROMUCOSAL | Status: AC

## 2020-07-12 SURGICAL SUPPLY — 45 items
ADH SKN CLS APL DERMABOND .7 (GAUZE/BANDAGES/DRESSINGS) ×1
BAG DECANTER FOR FLEXI CONT (MISCELLANEOUS) ×2 IMPLANT
CHLORAPREP W/TINT 10.5 ML (MISCELLANEOUS) ×2 IMPLANT
CLSR STERI-STRIP ANTIMIC 1/2X4 (GAUZE/BANDAGES/DRESSINGS) ×1 IMPLANT
COVER SURGICAL LIGHT HANDLE (MISCELLANEOUS) ×2 IMPLANT
COVER TRANSDUCER ULTRASND GEL (DISPOSABLE) ×2 IMPLANT
COVER WAND RF STERILE (DRAPES) ×1 IMPLANT
DECANTER SPIKE VIAL GLASS SM (MISCELLANEOUS) ×1 IMPLANT
DERMABOND ADVANCED (GAUZE/BANDAGES/DRESSINGS) ×1
DERMABOND ADVANCED .7 DNX12 (GAUZE/BANDAGES/DRESSINGS) ×1 IMPLANT
DRAPE C-ARM 42X120 X-RAY (DRAPES) ×2 IMPLANT
DRAPE CHEST BREAST 15X10 FENES (DRAPES) ×2 IMPLANT
DRSG TEGADERM 4X4.75 (GAUZE/BANDAGES/DRESSINGS) ×2 IMPLANT
ELECT CAUTERY BLADE 6.4 (BLADE) ×2 IMPLANT
ELECT REM PT RETURN 9FT ADLT (ELECTROSURGICAL) ×2
ELECTRODE REM PT RTRN 9FT ADLT (ELECTROSURGICAL) ×1 IMPLANT
GAUZE 4X4 16PLY RFD (DISPOSABLE) ×1 IMPLANT
GAUZE SPONGE 4X4 12PLY STRL LF (GAUZE/BANDAGES/DRESSINGS) ×1 IMPLANT
GEL ULTRASOUND 20GR AQUASONIC (MISCELLANEOUS) ×2 IMPLANT
GLOVE BIO SURGEON STRL SZ7 (GLOVE) ×2 IMPLANT
GLOVE BIOGEL PI IND STRL 7.5 (GLOVE) ×1 IMPLANT
GLOVE BIOGEL PI INDICATOR 7.5 (GLOVE) ×1
GOWN STRL REUS W/ TWL LRG LVL3 (GOWN DISPOSABLE) ×2 IMPLANT
GOWN STRL REUS W/TWL LRG LVL3 (GOWN DISPOSABLE) ×4
INTRODUCER COOK 11FR (CATHETERS) IMPLANT
KIT BASIN OR (CUSTOM PROCEDURE TRAY) ×2 IMPLANT
KIT PORT POWER 8FR ISP CVUE (Port) ×2 IMPLANT
KIT TURNOVER KIT B (KITS) ×2 IMPLANT
NS IRRIG 1000ML POUR BTL (IV SOLUTION) ×2 IMPLANT
PAD ARMBOARD 7.5X6 YLW CONV (MISCELLANEOUS) ×4 IMPLANT
PENCIL BUTTON HOLSTER BLD 10FT (ELECTRODE) ×2 IMPLANT
POSITIONER HEAD DONUT 9IN (MISCELLANEOUS) ×2 IMPLANT
SET INTRODUCER 12FR PACEMAKER (INTRODUCER) IMPLANT
SET SHEATH INTRODUCER 10FR (MISCELLANEOUS) IMPLANT
SHEATH COOK PEEL AWAY SET 9F (SHEATH) IMPLANT
SUT MNCRL AB 4-0 PS2 18 (SUTURE) ×2 IMPLANT
SUT PROLENE 2 0 SH DA (SUTURE) ×2 IMPLANT
SUT SILK 2 0 (SUTURE)
SUT SILK 2-0 18XBRD TIE 12 (SUTURE) IMPLANT
SUT VIC AB 3-0 SH 27 (SUTURE) ×2
SUT VIC AB 3-0 SH 27XBRD (SUTURE) ×1 IMPLANT
SYR 5ML LUER SLIP (SYRINGE) ×1 IMPLANT
TOWEL GREEN STERILE (TOWEL DISPOSABLE) ×2 IMPLANT
TOWEL GREEN STERILE FF (TOWEL DISPOSABLE) ×2 IMPLANT
TRAY LAPAROSCOPIC MC (CUSTOM PROCEDURE TRAY) ×2 IMPLANT

## 2020-07-12 NOTE — Research (Unsigned)
Title: [id number] - TREATMENT OF REFRACTORY NAUSEA   This Nurse has reviewed this patient's inclusion and exclusion criteria as a second review and confirms Janet [name] is eligible for study participation.  Patient may continue with enrollment.  Wells Guiles 'Hardin, RN, BSN Clinical Research Nurse 07/12/20, 2:41 PM

## 2020-07-12 NOTE — Interval H&P Note (Signed)
History and Physical Interval Note:  07/12/2020 8:32 AM  Janet [name]  has presented today for surgery, with the diagnosis of BREAST CANCER.  The various methods of treatment have been discussed with the patient and family. After consideration of risks, benefits and other options for treatment, the patient has consented to  Procedure(s): INSERTION PORT-A-CATH (N/A) as a surgical intervention.  The patient's history has been reviewed, patient examined, no change in status, stable for surgery.  I have reviewed the patient's chart and labs.  Questions were answered to the patient's satisfaction.     Rolm Bookbinder

## 2020-07-12 NOTE — Research (Unsigned)
[id number] - TREATMENT OF REFRACTORY NAUSEA   07/11/20      10:45AM   CONSENT VISIT: Patient Janet [name] was identified by Dr. Gudena as a potential candidate for the above listed study. This coordinator called and spoke with patient concerning potential interest in participating on 06/28/20 after the referral from Dr. Gudena. E-mailed a copy of the consent and HIPAA documentation to patient on 06/28/2020. Met with patient and her husband on 07/11/20 to thoroughly review and discuss the consent form for the above mentioned study.     As outlined in the informed consent form, this Coordinator and Darrell K Aranas discussed the purpose of the research study, the investigational nature of the study, study procedures and requirements for study participation, potential risks and benefits of study participation, as well as alternatives to participation. This study is blinded. The patient understands participation is voluntary and they may withdraw from study participation at any time.  Each study arm was reviewed, and randomization discussed.  Potential side effects were reviewed with patient as outlined in the consent form, and patient made aware there may be side effects not yet known. The chance of receiving placebo was discussed. Patient understands enrollment is pending full eligibility review.    Confidentiality and how the patient's information will be used as part of study participation were discussed.  Patient was informed there is not reimbursement provided for their time and effort spent on trial participation.  The patient is encouraged to discuss research study participation with their insurance provider to determine what costs they may incur as part of study participation, including research related injury.     All questions were answered to patient's satisfaction.  The informed consent and separate HIPAA Authorization was reviewed page by page.  The patient's mental and emotional status is  appropriate to provide informed consent, and the patient verbalizes an understanding of study participation.  Patient has agreed to participate in the above listed research study and has voluntarily signed the informed consent version and separate HIPAA Authorization, version Protocol version date 10/03/2018; Micro [employer] date: 07/14/2019; HIPAA dated 06/04/2016 on 07/11/20 at 11:20 AM.  The patient was provided with a copy of the signed informed consent form and separate HIPAA Authorization for their reference.  No study specific procedures were obtained prior to the signing of the informed consent document.  Approximately 30 minutes were spent with the patient reviewing the informed consent documents.  Patient was not requested to complete a Release of Information form. Questionnaires were provided to the patient if she did not receive them via e-mail. Confirmed patient was okay with receiving questionnaires via e-mail and patient provided e-mail address for questionnaires to be sent.    INITIAL ELIGIBILITY: This Coordinator has reviewed this patient's inclusion and exclusion criteria and confirmed Janet [name] is eligible for study participation.  Patient will continue with enrollment. Menopausal status (women only): Janet [name] is post menopausal with LMP 01/09/2016. Second eligibility was reviewed by Cameo Windham, RN, Clinical Research Nurse as well as Dr. Gudena, MD. A final review was done by Liza Oakes, RN, Clinical Research Nurse.   REGISTRATION: Patient Janet [name], was registered to the above listed study, assigned study#[id number]. Randomization will occur if patient proceeds to part 2.    PLAN: Patient will receive first on Wednesday 07/13/20. This clinical research coordinator will call patient on Day 6 (Monday 07/18/20) , due to Day 4 (Saturday, 07/16/20) being on a non-business working day.    

## 2020-07-12 NOTE — Progress Notes (Signed)
  Oncology Clinical Pharmacist Note  Janet [name] is a 55 y.o. female with a diagnosis of breast cancer who will be starting neoadjuvant TCHP under the care of Dr. Nicholas Lose. Upon clinical review today, identified Ziextenzo had been added to day 3 of each cycle.  Neulasta Onpro approved last week and added at that time to each day 1 treatment (cycles 1-6) and communication order added to treatment plan.  Removed duplicate Ziextenzo and notified provider.  Clinical pharmacy will continue to support Janet [name] and Janet [name] as needed.  Raina Mina, [place],  07/12/2020  12:48 PM

## 2020-07-12 NOTE — Assessment & Plan Note (Addendum)
06/23/2020: Normal mammogram September 2021.  December 2021: Following trauma to the breast she felt a lump but for variety of reasons work-up was postponed.  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  Treatment Plan: 1. Neoadjuvant chemotherapy with TCH Perjeta 6 cycles followed by Herceptin Perjeta maintenance versus Kadcyla maintenance (based on response to neoadjuvant chemo) for 1 year 2. Followed by breast conserving surgery if possible with sentinel lymph node study 3. Followed by adjuvant radiation therapy if patient had lumpectomy 4.  Followed by neratinib MRI liver 07/11/20: Hepatic cysts Bone scan and CT CAP: No mets ---------------------------------------------------------------------------------------------------------------------------- Current Treatment: Cycle 1 day 1 TCHP ECHO: 07/11/20: EF 60-65% Labs reviewed Chemo consent obtained, chemo education completed. RTC in 1 week for tox check

## 2020-07-12 NOTE — Progress Notes (Signed)
Patient Care Team: Charlynn Court, NP as PCP - General (Nurse Practitioner) Rockwell Germany, RN as Oncology Nurse Navigator Mauro Kaufmann, RN as Oncology Nurse Navigator  DIAGNOSIS:    ICD-10-CM   1. Malignant neoplasm of lower-outer quadrant of left breast of female, estrogen receptor positive ([place])  C50.512    Z17.0     SUMMARY OF ONCOLOGIC HISTORY: Oncology History  Malignant neoplasm of lower-outer quadrant of left breast of female, estrogen receptor positive ([place])  06/23/2020 Initial Diagnosis   Normal mammogram September 2021.  December 2021: Following trauma to the breast she felt a lump but for variety of reasons work-up was postponed.  Mammogram revealed 2 tumors measuring 7 cm individually 4 cm and 3 cm, 3 axillary lymph nodes, biopsy revealed grade 3 IDC ER 10%, PR 0%, Ki-67 40%, HER-2 +3+ by IHC, lymph node also positive   06/28/2020 Cancer Staging   Staging form: Breast, AJCC 8th Edition - Clinical stage from 06/28/2020: Stage IIB (cT2, cN1, cM0, G3, ER+, PR-, HER2+) - Signed by Nicholas Lose, MD on 06/28/2020 Stage prefix: Initial diagnosis Histologic grading system: 3 grade system   07/13/2020 -  Chemotherapy    Patient is on Treatment Plan: BREAST  DOCETAXEL + CARBOPLATIN + TRASTUZUMAB + PERTUZUMAB  (TCHP) Q21D         CHIEF COMPLIANT: Cycle 1 TCH Perjeta   INTERVAL HISTORY: Janet [name] is a 55 y.o. with above-mentioned history of left breast cancer currently on neoadjuvant chemotherapy with [place] Perjeta. Echo on 07/11/20 showed an ejection fraction of 60-65%. CT CAP on 07/08/20 showed no evidence of distant metastatic disease. Bone scan on 07/08/20 showed no evidence of osseous metastatic disease. Liver MRI on 07/11/20 showed multiple hepatic cysts and no evidence of metastatic disease. She presents to the clinic today for cycle 1.   ALLERGIES:  has No Known Allergies.  MEDICATIONS:  Current Outpatient Medications  Medication Sig Dispense Refill  .  dexamethasone (DECADRON) 4 MG tablet Take 1 tablet (4 mg total) by mouth daily. Take 1 tablet day before chemo and 1 tablet day after chemo with food 12 tablet 0  . lidocaine-prilocaine (EMLA) cream Apply to affected area once 30 g 3  . ondansetron (ZOFRAN) 8 MG tablet Take 1 tablet (8 mg total) by mouth 2 (two) times daily as needed (Nausea or vomiting). Start on the third day after chemotherapy. 30 tablet 1  . prochlorperazine (COMPAZINE) 10 MG tablet Take 1 tablet (10 mg total) by mouth every 6 (six) hours as needed (Nausea or vomiting). 30 tablet 1  . traMADol (ULTRAM) 50 MG tablet Take 2 tablets (100 mg total) by mouth every 6 (six) hours as needed. 8 tablet 0   No current facility-administered medications for this visit.   Facility-Administered Medications Ordered in Other Visits  Medication Dose Route Frequency Provider Last Rate Last Admin  . sodium chloride flush (NS) 0.9 % injection 10 mL  10 mL Intravenous PRN Nicholas Lose, MD   10 mL at 07/13/20 0747    PHYSICAL EXAMINATION: ECOG PERFORMANCE STATUS: 1 - Symptomatic but completely ambulatory  There were no vitals filed for this visit. There were no vitals filed for this visit.  LABORATORY DATA:  I have reviewed the data as listed No flowsheet data found.  Lab Results  Component Value Date   WBC 6.0 07/12/2020   HGB 14.0 07/12/2020   HCT 43.7 07/12/2020   MCV 93.6 07/12/2020   PLT 261 07/12/2020  ASSESSMENT & PLAN:  Malignant neoplasm of lower-outer quadrant of left breast of female, estrogen receptor positive (Janet [name]) 06/23/2020: Normal mammogram September 2021.  December 2021: Following trauma to the breast she felt a lump but for variety of reasons work-up was postponed.  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  Treatment Plan: 1. Neoadjuvant chemotherapy with TCH  Perjeta 6 cycles followed by Herceptin Perjeta maintenance versus Kadcyla maintenance (based on response to neoadjuvant chemo) for 1 year 2. Followed by breast conserving surgery if possible with axillary lymph node dissection 3. Followed by adjuvant radiation therapy 4.  Followed by antiestrogen therapy 5.  Followed by neratinib MRI liver 07/11/20: Hepatic cysts Bone scan and CT CAP: No mets Breast MRI 06/28/2020: 2 large left breast masses each 0.5 cm, overall 7 cm, third contiguous mass 3 cm (could be a lymph node, abutting pectoralis muscle), 4 lymph nodes left axilla ---------------------------------------------------------------------------------------------------------------------------- Current Treatment: Cycle 1 day 1 TCHP ECHO: 07/11/20: EF 60-65% Labs reviewed Chemo consent obtained, chemo education completed. RTC in 1 week for tox check  No orders of the defined types were placed in this encounter.  The patient has a good understanding of the overall plan. she agrees with it. she will call with any problems that may develop before the next visit here.  Total time spent: 30 mins including face to face time and time spent for planning, charting and coordination of care  Rulon Eisenmenger, MD, MPH 07/13/2020  I, Molly Dorshimer, am acting as scribe for Dr. Nicholas Lose.  I have reviewed the above documentation for accuracy and completeness, and I agree with the above.

## 2020-07-12 NOTE — Anesthesia Preprocedure Evaluation (Addendum)
Anesthesia Evaluation  Patient identified by MRN, date of birth, ID band Patient awake    Reviewed: Allergy & Precautions, NPO status , Patient's Chart, lab work & pertinent test results  Airway Mallampati: II  TM Distance: >3 FB Neck ROM: Full    Dental  (+) Teeth Intact, Dental Advisory Given   Pulmonary    breath sounds clear to auscultation       Cardiovascular  Rhythm:Regular Rate:Normal     Neuro/Psych    GI/Hepatic   Endo/Other    Renal/GU      Musculoskeletal   Abdominal   Peds  Hematology   Anesthesia Other Findings   Reproductive/Obstetrics                            Anesthesia Physical Anesthesia Plan  ASA: II  Anesthesia Plan: General   Post-op Pain Management:    Induction: Intravenous  PONV Risk Score and Plan: Ondansetron and Dexamethasone  Airway Management Planned: LMA  Additional Equipment:   Intra-op Plan:   Post-operative Plan:   Informed Consent: I have reviewed the patients History and Physical, chart, labs and discussed the procedure including the risks, benefits and alternatives for the proposed anesthesia with the patient or authorized representative who has indicated his/her understanding and acceptance.     Dental advisory given  Plan Discussed with: CRNA  Anesthesia Plan Comments:         Anesthesia Quick Evaluation

## 2020-07-12 NOTE — Discharge Instructions (Signed)
    PORT-A-CATH: POST OP INSTRUCTIONS  Always review your discharge instruction sheet given to you by the facility where your surgery was performed.   1. A prescription for pain medication may be given to you upon discharge. Take your pain medication as prescribed, if needed. If narcotic pain medicine is not needed, then you make take acetaminophen (Tylenol) or ibuprofen (Advil) as needed.  2. Take your usually prescribed medications unless otherwise directed. 3. If you need a refill on your pain medication, please contact our office. All narcotic pain medicine now requires a paper prescription.  Phoned in and fax refills are no longer allowed by law.  Prescriptions will not be filled after 5 pm or on weekends.  4. You should follow a light diet for the remainder of the day after your procedure. 5. Most patients will experience some mild swelling and/or bruising in the area of the incision. It may take several days to resolve. 6. It is common to experience some constipation if taking pain medication after surgery. Increasing fluid intake and taking a stool softener (such as Colace) will usually help or prevent this problem from occurring. A mild laxative (Milk of Magnesia or Miralax) should be taken according to package directions if there are no bowel movements after 48 hours.  7. Unless discharge instructions indicate otherwise, you may remove your bandages 48 hours after surgery, and you may shower at that time. You may have steri-strips (small white skin tapes) in place directly over the incision.  These strips should be left on the skin for 7-10 days.  If your surgeon used Dermabond (skin glue) on the incision, you may shower in 24 hours.  The glue will flake off over the next 2-3 weeks.  8. If your port is left accessed at the end of surgery (needle left in port), the dressing cannot get wet and should only by changed by a healthcare professional. When the port is no longer accessed (when the  needle has been removed), follow step 7.   9. ACTIVITIES:  Limit activity involving your arms for the next 72 hours. Do no strenuous exercise or activity for 1 week. You may drive when you are no longer taking prescription pain medication, you can comfortably wear a seatbelt, and you can maneuver your car. 10.You may need to see your doctor in the office for a follow-up appointment.  Please       check with your doctor.  11.When you receive a new Port-a-Cath, you will get a product guide and        ID card.  Please keep them in case you need them.  WHEN TO CALL YOUR DOCTOR ([phone]): 1. Fever over 101.0 2. Chills 3. Continued bleeding from incision 4. Increased redness and tenderness at the site 5. Shortness of breath, difficulty breathing   The clinic staff is available to answer your questions during regular business hours. Please don't hesitate to call and ask to speak to one of the nurses or medical assistants for clinical concerns. If you have a medical emergency, go to the nearest emergency room or call 911.  A surgeon from [facility] is always on call at the hospital.     For further information, please visit [email or web address]      

## 2020-07-12 NOTE — Research (Unsigned)
DCP-001: Use of a Clinical Trial Screening Tool to Address Cancer Health Disparities in the [place] [facility] ([facility])  07/11/20    11:30AM  INTRODUCTION: After reviewing and signing consent and HIPAA Authorization form for [place], this coordinator introduced the above mentioned study. Briefly reviewed purpose, overall schema, and one time data collection nature of study. Gave consent and HIPAA authorizations forms for patient to take home and review. Plan is to discuss the potential participation in this study at a later visit. Patient was thanked for her time.  Carol Ada, RT(R)(T) Clinical Research Coordinator

## 2020-07-12 NOTE — Op Note (Signed)
Preoperative diagnosis:left breast cancer Postoperative diagnosis: Same as above Procedure: Rightinternal jugular port placement with ultrasound guidance Surgeon: Dr. Serita Grammes Anesthesia: General  Estimated blood loss:minimal Specimens:none Sponge and needlecount was correct atcompletion Drains: None Disposition recovery stable condition  Indications:55 yof presents with palpable breast mass noted at beginning of year. she underwent imaging that shows 2 adjacent masses in lateral left breast that are 3.9 and 4 cm by mm. she has an axillary tail mass that is 2.9 cm. she has at least 3 by [place] axillary nodes that are enlarged with largest being 2.5 cm. MRI was done that shows negative right breast, there are 3.4 and 3.6 cm masses left breast laterally that are really fairly contiguous, 3 cm mass in ax tail and at least 4 enlarged ax nodes present. there are some too small to characterize liver nodules with mr recommended. biopsy of the im node and ax node are positive. biopsy of the breast lesions are grade III IDC 10% er pos, pr neg, her 2 positive, and Ki is 40%. We have elected to proceed with primary systemic therapy and we discussed port placement.   Procedure: After informed consent was obtainedshe was taken to the OR.She was given antibiotics. SCDs were placed. She was placed under general anesthesia without complication. She was prepped and draped in the standard sterile surgical fashion. A surgical timeout was then performed.  I used the ultrasound to identify therightinternal jugular vein. Under ultrasound guidance I then accessed the vein with the needle. I passed the wire. The wire was in the vein both by ultrasound and by fluoroscopy. I thenmade an incisionon herrightchest.I tunneled the line between the 2 sites. I then placed the dilator over the wire. I observed this with fluoroscopy to go in the correct position. I then removedthe wire. I then  passed the line. The peel-away sheath was removed. I pulled the line back to be in the superior vena cava.The tip of the line is in the superior vena cavanear the cavoatrial junction.I then attached the port. I sutured this into place with 2-0 Prolene. I then closed this with 3-0 Vicryl and 4-0 Monocryl. Glue was placed. Final fluoroscopic image showed the port to be in good position. I then accessed the port and was able to aspirate blood and packed this with heparin.I placed a dressing and left accessed to begin chemotherapy tomorrow.She tolerated well, was transferred to recovery stable.

## 2020-07-12 NOTE — Research (Signed)
[id number] - TREATMENT OF REFRACTORY NAUSEA   07/11/20      10:45AM   CONSENT VISIT: Patient Janet [name] was identified by Dr. Lindi Adie as a potential candidate for the above listed study. This coordinator called and spoke with patient concerning potential interest in participating on 06/28/20 after the referral from Dr. Lindi Adie. E-mailed a copy of the consent and HIPAA documentation to patient on 06/28/2020. Met with patient and her husband on 07/11/20 to thoroughly review and discuss the consent form for the above mentioned study.     As outlined in the informed consent form, this Coordinator and Janet [name] discussed the purpose of the research study, the investigational nature of the study, study procedures and requirements for study participation, potential risks and benefits of study participation, as well as alternatives to participation. This study is blinded. The patient understands participation is voluntary and they may withdraw from study participation at any time.  Each study arm was reviewed, and randomization discussed.  Potential side effects were reviewed with patient as outlined in the consent form, and patient made aware there may be side effects not yet known. The chance of receiving placebo was discussed. Patient understands enrollment is pending full eligibility review.    Confidentiality and how the patient's information will be used as part of study participation were discussed.  Patient was informed there is not reimbursement provided for their time and effort spent on trial participation.  The patient is encouraged to discuss research study participation with their insurance provider to determine what costs they may incur as part of study participation, including research related injury.     All questions were answered to patient's satisfaction.  The informed consent and separate HIPAA Authorization was reviewed page by page.  The patient's mental and emotional status is  appropriate to provide informed consent, and the patient verbalizes an understanding of study participation.  Patient has agreed to participate in the above listed research study and has voluntarily signed the informed consent version and separate HIPAA Authorization, version Protocol version date 10/03/2018; [place] Activation date: 07/14/2019; HIPAA dated 06/04/2016 on 07/11/20 at 11:20 AM.  The patient was provided with a copy of the signed informed consent form and separate HIPAA Authorization for their reference.  No study specific procedures were obtained prior to the signing of the informed consent document.  Approximately 30 minutes were spent with the patient reviewing the informed consent documents.  Patient was not requested to complete a Release of Information form. Questionnaires were provided to the patient if she did not receive them via e-mail. Confirmed patient was okay with receiving questionnaires via e-mail and patient provided e-mail address for questionnaires to be sent.    INITIAL ELIGIBILITY: This Coordinator has reviewed this patient's inclusion and exclusion criteria and confirmed Janet [name] is eligible for study participation.  Patient will continue with enrollment. Menopausal status (women only): Janet [name] is post menopausal with LMP 01/09/2016. Second eligibility was reviewed by Foye Spurling, RN, Clinical Research Nurse as well as Dr. Lindi Adie, MD. A final review was done by Ruben Im, RN, Clinical Research Nurse.   REGISTRATION: Patient Janet [name], was registered to the above listed study, assigned study#[id number]. Randomization will occur if patient proceeds to part 2.    PLAN: Patient will receive first on Wednesday 07/13/20. This clinical research coordinator will call patient on Day 6 (Monday 07/18/20) , due to Day 4 (Saturday, 07/16/20) being on a non-business working day.  Carol Ada, RT(R)(T) Clinical Research Coordinator

## 2020-07-12 NOTE — Anesthesia Postprocedure Evaluation (Signed)
Anesthesia Post Note  Patient: Janet [name]  Procedure(s) Performed: INSERTION PORT-A-CATH RIGHT INTERNAL JUGULAR (Right Breast)     Patient location during evaluation: PACU Anesthesia Type: General Level of consciousness: awake and alert Pain management: pain level controlled Vital Signs Assessment: post-procedure vital signs reviewed and stable Respiratory status: spontaneous breathing, nonlabored ventilation, respiratory function stable and patient connected to nasal cannula oxygen Cardiovascular status: blood pressure returned to baseline and stable Postop Assessment: no apparent nausea or vomiting Anesthetic complications: no   No complications documented.  Last Vitals:  Vitals:   07/12/20 1015 07/12/20 1025  BP: 115/73 119/82  Pulse: 83 70  Resp: 12 14  Temp:  36.4 C  SpO2: 99% 99%    Last Pain:  Vitals:   07/12/20 1025  TempSrc:   PainSc: 0-No pain                 Ansen Sayegh COKER

## 2020-07-12 NOTE — Transfer of Care (Signed)
Immediate Anesthesia Transfer of Care Note  Patient: Janet [name]  Procedure(s) Performed: INSERTION PORT-A-CATH RIGHT INTERNAL JUGULAR (Right Breast)  Patient Location: PACU  Anesthesia Type:General  Level of Consciousness: awake, alert  and oriented  Airway & Oxygen Therapy: Patient Spontanous Breathing  Post-op Assessment: Report given to RN and Post -op Vital signs reviewed and stable  Post vital signs: Reviewed and stable  Last Vitals:  Vitals Value Taken Time  BP    Temp    Pulse 74 07/12/20 1011  Resp 14 07/12/20 1011  SpO2 100 % 07/12/20 1011  Vitals shown include unvalidated device data.  Last Pain:  Vitals:   07/12/20 0744  TempSrc:   PainSc: 0-No pain         Complications: No complications documented.

## 2020-07-12 NOTE — H&P (Signed)
55 yof presents with palpable breast mass noted at beginning of year. she underwent imaging that shows 2 adjacent masses in lateral left breast that are 3.9 and 4 cm by mm. she has an axillary tail mass that is 2.9 cm. she has at least 3 by [place] axillary nodes that are enlarged with largest being 2.5 cm. MRI was done that shows negative right breast, there are 3.4 and 3.6 cm masses left breast laterally that are really fairly contiguous, 3 cm mass in ax tail and at least 4 enlarged ax nodes present. there are some too small to characterize liver nodules with mr recommended. biopsy of the im node and ax node are positive. biopsy of the breast lesions are grade III IDC 10% er pos, pr neg, her 2 positive, and Ki is 40%. she has been seen by oncology and is here to discuss options   Past Surgical History Altamese Cabal, [place]; 06/29/2020 10:23 AM) Breast Biopsy  Left. multiple Oral Surgery   Diagnostic Studies History Altamese Cabal, CMA; 06/29/2020 10:23 AM) Colonoscopy  1-5 years ago Mammogram  within last year Pap Smear  1-5 years ago  Allergies Altamese Cabal, [place]; 06/29/2020 10:23 AM) No Known Allergies  [06/29/2020]: Allergies Reconciled   Medication History Altamese Cabal, [place]; 06/29/2020 10:23 AM) No Current Medications Medications Reconciled  Social History Altamese Cabal, CMA; 06/29/2020 10:23 AM) Caffeine use  Coffee, Tea. No alcohol use  No drug use  Tobacco use  Former smoker.  Family History Altamese Cabal, [place]; 06/29/2020 10:23 AM) Arthritis  Brother, Mother. Cancer  Father. Colon Polyps  Mother. Diabetes Mellitus  Mother. Hypertension  Mother.  Pregnancy / Birth History Altamese Cabal, CMA; 06/29/2020 10:23 AM) Age at menarche  55 years. Age of menopause  51-55 Contraceptive History  Oral contraceptives. Gravida  2 Length (months) of breastfeeding  3-6 Maternal age  70-20 Para  2 Regular periods   Other Problems Altamese Cabal, CMA;  06/29/2020 10:23 AM) Breast Cancer     Review of Systems Altamese Cabal CMA; 06/29/2020 10:23 AM) General Not Present- Appetite Loss, Chills, Fatigue, Fever, Night Sweats, Weight Gain and Weight Loss. Skin Not Present- Change in Wart/Mole, Dryness, Hives, Jaundice, New Lesions, Non-Healing Wounds, Rash and Ulcer. HEENT Present- Wears glasses/contact lenses. Not Present- Earache, Hearing Loss, Hoarseness, Nose Bleed, Oral Ulcers, Ringing in the Ears, Seasonal Allergies, Sinus Pain, Sore Throat, Visual Disturbances and Yellow Eyes. Respiratory Not Present- Bloody sputum, Chronic Cough, Difficulty Breathing, Snoring and Wheezing. Breast Present- Breast Mass and Breast Pain. Not Present- Nipple Discharge and Skin Changes. Cardiovascular Not Present- Chest Pain, Difficulty Breathing Lying Down, Leg Cramps, Palpitations, Rapid Heart Rate, Shortness of Breath and Swelling of Extremities. Gastrointestinal Not Present- Abdominal Pain, Bloating, Bloody Stool, Change in Bowel Habits, Chronic diarrhea, Constipation, Difficulty Swallowing, Excessive gas, Gets full quickly at meals, Hemorrhoids, Indigestion, Nausea, Rectal Pain and Vomiting. Female Genitourinary Not Present- Frequency, Nocturia, Painful Urination, Pelvic Pain and Urgency. Musculoskeletal Not Present- Back Pain, Joint Pain, Joint Stiffness, Muscle Pain, Muscle Weakness and Swelling of Extremities. Neurological Not Present- Decreased Memory, Fainting, Headaches, Numbness, Seizures, Tingling, Tremor, Trouble walking and Weakness. Psychiatric Not Present- Anxiety, Bipolar, Change in Sleep Pattern, Depression, Fearful and Frequent crying. Endocrine Not Present- Cold Intolerance, Excessive Hunger, Hair Changes, Heat Intolerance, Hot flashes and New Diabetes. Hematology Not Present- Blood Thinners, Easy Bruising, Excessive bleeding, Gland problems, HIV and Persistent Infections.  Vitals Altamese Cabal CMA; 06/29/2020 10:25 AM) 06/29/2020 10:24  AM Weight: 149.38 lb Height: 69in Body Surface  Area: 1.82 m Body Mass Index: 22.06 kg/m  Temp.: 97.81F  Pulse: 102 (Regular)  Resp.: 18 (Unlabored)  P.OX: 93% (Room air) BP: 120/66(Sitting, Left Arm, Standard)       Physical Exam Rolm Bookbinder MD; 06/29/2020 12:11 PM) General Mental Status-Alert. Orientation-Oriented X3.  Breast Nipples-No Discharge. Note: no right breast mass large over 5 cm lateral right breast mass, no skin involvement, mobile axillary tail 2-3 cm mobile mass   Lymphatic Head & Neck  General Head & Neck Lymphatics: Bilateral - Description - Normal. Axillary  General Axillary Region: Bilateral - Description - Normal.    Assessment & Plan Rolm Bookbinder MD; 06/29/2020 2:18 PM) BREAST CANCER METASTASIZED TO AXILLARY LYMPH NODE, LEFT (C50.912) Story: Port placement, genetics, liver MRI , staging scans We discussed the staging and pathophysiology of breast cancer. We discussed all of the different options for treatment for breast cancer including surgery, chemotherapy, radiation therapy, Herceptin, and antiestrogen therapy. discussed types of surgery at later date. discussed rationale for systemic therapy first with large tumor and node positive her 2 positive disease will place port soon

## 2020-07-12 NOTE — Anesthesia Procedure Notes (Signed)
Procedure Name: LMA Insertion Date/Time: 07/12/2020 9:16 AM Performed by: Kyung Rudd, CRNA Pre-anesthesia Checklist: Patient identified, Emergency Drugs available, Suction available and Patient being monitored Patient Re-evaluated:Patient Re-evaluated prior to induction Oxygen Delivery Method: Circle system utilized Preoxygenation: Pre-oxygenation with 100% oxygen Induction Type: IV induction LMA: LMA inserted LMA Size: 4.0 Number of attempts: 1 Placement Confirmation: positive ETCO2 and breath sounds checked- equal and bilateral Tube secured with: Tape Dental Injury: Teeth and Oropharynx as per pre-operative assessment

## 2020-07-12 NOTE — Research (Signed)
DCP-001: Use of a Clinical Trial Screening Tool to Address Cancer Health Disparities in the [facility] ([facility])  07/11/20    11:30AM  INTRODUCTION: After reviewing and signing consent and HIPAA Authorization form for [facility] 16070, this coordinator introduced the above mentioned study. Briefly reviewed purpose, overall schema, and one time data collection nature of study. Gave consent and HIPAA authorizations forms for patient to take home and review. Plan is to discuss the potential participation in this study at a later visit. Patient was thanked for her time.  Clair Bardwell, RT(R)(T) Clinical Research Coordinator 

## 2020-07-13 ENCOUNTER — Inpatient Hospital Stay: Payer: 59

## 2020-07-13 ENCOUNTER — Encounter: Payer: Self-pay | Admitting: *Deleted

## 2020-07-13 ENCOUNTER — Encounter: Payer: Self-pay | Admitting: Pharmacist

## 2020-07-13 ENCOUNTER — Other Ambulatory Visit: Payer: Self-pay | Admitting: Pharmacist

## 2020-07-13 ENCOUNTER — Encounter: Payer: Self-pay | Admitting: Radiology

## 2020-07-13 ENCOUNTER — Other Ambulatory Visit: Payer: 59

## 2020-07-13 ENCOUNTER — Encounter (HOSPITAL_COMMUNITY): Payer: Self-pay | Admitting: General Surgery

## 2020-07-13 ENCOUNTER — Inpatient Hospital Stay (HOSPITAL_BASED_OUTPATIENT_CLINIC_OR_DEPARTMENT_OTHER): Payer: 59 | Admitting: Hematology and Oncology

## 2020-07-13 VITALS — BP 101/65 | HR 71 | Temp 97.7°F | Resp 16

## 2020-07-13 DIAGNOSIS — C50512 Malignant neoplasm of lower-outer quadrant of left female breast: Secondary | ICD-10-CM

## 2020-07-13 DIAGNOSIS — Z170 Estrogen receptor positive status [ER+]: Secondary | ICD-10-CM

## 2020-07-13 DIAGNOSIS — Z95828 Presence of other vascular implants and grafts: Secondary | ICD-10-CM

## 2020-07-13 LAB — CBC WITH DIFFERENTIAL (CANCER CENTER ONLY)
Abs Immature Granulocytes: 0.04 10*3/uL (ref 0.00–0.07)
Basophils Absolute: 0 10*3/uL (ref 0.0–0.1)
Basophils Relative: 0 %
Eosinophils Absolute: 0 10*3/uL (ref 0.0–0.5)
Eosinophils Relative: 0 %
HCT: 39.6 % (ref 36.0–46.0)
Hemoglobin: 13 g/dL (ref 12.0–15.0)
Immature Granulocytes: 0 %
Lymphocytes Relative: 11 %
Lymphs Abs: 1.4 10*3/uL (ref 0.7–4.0)
MCH: 29.7 pg (ref 26.0–34.0)
MCHC: 32.8 g/dL (ref 30.0–36.0)
MCV: 90.4 fL (ref 80.0–100.0)
Monocytes Absolute: 0.9 10*3/uL (ref 0.1–1.0)
Monocytes Relative: 7 %
Neutro Abs: 10.5 10*3/uL — ABNORMAL HIGH (ref 1.7–7.7)
Neutrophils Relative %: 82 %
Platelet Count: 284 10*3/uL (ref 150–400)
RBC: 4.38 MIL/uL (ref 3.87–5.11)
RDW: 12.5 % (ref 11.5–15.5)
WBC Count: 12.8 10*3/uL — ABNORMAL HIGH (ref 4.0–10.5)
nRBC: 0 % (ref 0.0–0.2)

## 2020-07-13 LAB — CMP (CANCER CENTER ONLY)
ALT: 9 U/L (ref 0–44)
AST: 11 U/L — ABNORMAL LOW (ref 15–41)
Albumin: 4 g/dL (ref 3.5–5.0)
Alkaline Phosphatase: 66 U/L (ref 38–126)
Anion gap: 9 (ref 5–15)
BUN: 13 mg/dL (ref 6–20)
CO2: 27 mmol/L (ref 22–32)
Calcium: 9.7 mg/dL (ref 8.9–10.3)
Chloride: 107 mmol/L (ref 98–111)
Creatinine: 0.74 mg/dL (ref 0.44–1.00)
GFR, Estimated: >60 mL/min (ref >60)
Glucose, Bld: 98 mg/dL (ref 70–99)
Potassium: 4.1 mmol/L (ref 3.5–5.1)
Sodium: 143 mmol/L (ref 135–145)
Total Bilirubin: 0.4 mg/dL (ref 0.3–1.2)
Total Protein: 7.3 g/dL (ref 6.5–8.1)

## 2020-07-13 LAB — RESEARCH LABS

## 2020-07-13 MED ORDER — PALONOSETRON HCL INJECTION 0.25 MG/5ML
0.2500 mg | Freq: Once | INTRAVENOUS | Status: AC
  Administered 2020-07-13: 0.25 mg via INTRAVENOUS

## 2020-07-13 MED ORDER — SODIUM CHLORIDE 0.9% FLUSH
10.0000 mL | INTRAVENOUS | Status: DC | PRN
  Administered 2020-07-13: 10 mL via INTRAVENOUS
  Filled 2020-07-13: qty 10

## 2020-07-13 MED ORDER — PEGFILGRASTIM 6 MG/0.6ML ~~LOC~~ PSKT: 6.0000 mg | PREFILLED_SYRINGE | Freq: Once | SUBCUTANEOUS | Status: DC

## 2020-07-13 MED ORDER — PALONOSETRON HCL INJECTION 0.25 MG/5ML
INTRAVENOUS | Status: AC
  Filled 2020-07-13: qty 5

## 2020-07-13 MED ORDER — SODIUM CHLORIDE 0.9 % IV SOLN
Freq: Once | INTRAVENOUS | Status: AC
Start: 2020-07-13 — End: 2020-07-13
  Filled 2020-07-13: qty 250

## 2020-07-13 MED ORDER — SODIUM CHLORIDE 0.9 % IV SOLN
10.0000 mg | Freq: Once | INTRAVENOUS | Status: AC
  Administered 2020-07-13: 10 mg via INTRAVENOUS
  Filled 2020-07-13: qty 10

## 2020-07-13 MED ORDER — HEPARIN SOD (PORK) LOCK FLUSH 100 UNIT/ML IV SOLN
500.0000 [IU] | Freq: Once | INTRAVENOUS | Status: AC | PRN
  Administered 2020-07-13: 500 [IU]
  Filled 2020-07-13: qty 5

## 2020-07-13 MED ORDER — ACETAMINOPHEN 325 MG PO TABS
650.0000 mg | ORAL_TABLET | Freq: Once | ORAL | Status: AC
Start: 2020-07-13 — End: 2020-07-13
  Administered 2020-07-13: 650 mg via ORAL

## 2020-07-13 MED ORDER — DIPHENHYDRAMINE HCL 25 MG PO CAPS
50.0000 mg | ORAL_CAPSULE | Freq: Once | ORAL | Status: AC
  Administered 2020-07-13: 50 mg via ORAL

## 2020-07-13 MED ORDER — PERTUZUMAB CHEMO INJECTION 420 MG/14ML
840.0000 mg | Freq: Once | INTRAVENOUS | Status: AC
  Administered 2020-07-13: 840 mg via INTRAVENOUS
  Filled 2020-07-13: qty 28

## 2020-07-13 MED ORDER — SODIUM CHLORIDE 0.9 % IV SOLN
75.0000 mg/m2 | Freq: Once | INTRAVENOUS | Status: AC
  Administered 2020-07-13: 140 mg via INTRAVENOUS
  Filled 2020-07-13: qty 14

## 2020-07-13 MED ORDER — SODIUM CHLORIDE 0.9% FLUSH
10.0000 mL | INTRAVENOUS | Status: DC | PRN
  Administered 2020-07-13: 10 mL
  Filled 2020-07-13: qty 10

## 2020-07-13 MED ORDER — TRASTUZUMAB-ANNS CHEMO 150 MG IV SOLR
8.0000 mg/kg | Freq: Once | INTRAVENOUS | Status: AC
  Administered 2020-07-13: 546 mg via INTRAVENOUS
  Filled 2020-07-13: qty 26

## 2020-07-13 MED ORDER — ACETAMINOPHEN 325 MG PO TABS
ORAL_TABLET | ORAL | Status: AC
  Filled 2020-07-13: qty 2

## 2020-07-13 MED ORDER — DIPHENHYDRAMINE HCL 25 MG PO CAPS
ORAL_CAPSULE | ORAL | Status: AC
  Filled 2020-07-13: qty 2

## 2020-07-13 MED ORDER — SODIUM CHLORIDE 0.9 % IV SOLN
700.0000 mg | Freq: Once | INTRAVENOUS | Status: AC
  Administered 2020-07-13: 700 mg via INTRAVENOUS
  Filled 2020-07-13: qty 70

## 2020-07-13 MED ORDER — SODIUM CHLORIDE 0.9 % IV SOLN
150.0000 mg | Freq: Once | INTRAVENOUS | Status: AC
  Administered 2020-07-13: 150 mg via INTRAVENOUS
  Filled 2020-07-13: qty 150

## 2020-07-13 NOTE — Patient Instructions (Signed)
Implanted Port Insertion, Care After This sheet gives you information about how to care for yourself after your procedure. Your health care provider may also give you more specific instructions. If you have problems or questions, contact your health care provider. What can I expect after the procedure? After the procedure, it is common to have:  Discomfort at the port insertion site.  Bruising on the skin over the port. This should improve over 3-4 days. Follow these instructions at home: Port care  After your port is placed, you will get a manufacturer's information card. The card has information about your port. Keep this card with you at all times.  Take care of the port as told by your health care provider. Ask your health care provider if you or a family member can get training for taking care of the port at home. A home health care nurse may also take care of the port.  Make sure to remember what type of port you have. Incision care  Follow instructions from your health care provider about how to take care of your port insertion site. Make sure you: ? Wash your hands with soap and water before and after you change your bandage (dressing). If soap and water are not available, use hand sanitizer. ? Change your dressing as told by your health care provider. ? Leave stitches (sutures), skin glue, or adhesive strips in place. These skin closures may need to stay in place for 2 weeks or longer. If adhesive strip edges start to loosen and curl up, you may trim the loose edges. Do not remove adhesive strips completely unless your health care provider tells you to do that.  Check your port insertion site every day for signs of infection. Check for: ? Redness, swelling, or pain. ? Fluid or blood. ? Warmth. ? Pus or a bad smell.      Activity  Return to your normal activities as told by your health care provider. Ask your health care provider what activities are safe for you.  Do not  lift anything that is heavier than 10 lb (4.5 kg), or the limit that you are told, until your health care provider says that it is safe. General instructions  Take over-the-counter and prescription medicines only as told by your health care provider.  Do not take baths, swim, or use a hot tub until your health care provider approves. Ask your health care provider if you may take showers. You may only be allowed to take sponge baths.  Do not drive for 24 hours if you were given a sedative during your procedure.  Wear a medical alert bracelet in case of an emergency. This will tell any health care providers that you have a port.  Keep all follow-up visits as told by your health care provider. This is important. Contact a health care provider if:  You cannot flush your port with saline as directed, or you cannot draw blood from the port.  You have a fever or chills.  You have redness, swelling, or pain around your port insertion site.  You have fluid or blood coming from your port insertion site.  Your port insertion site feels warm to the touch.  You have pus or a bad smell coming from the port insertion site. Get help right away if:  You have chest pain or shortness of breath.  You have bleeding from your port that you cannot control. Summary  Take care of the port as told by your   health care provider. Keep the manufacturer's information card with you at all times.  Change your dressing as told by your health care provider.  Contact a health care provider if you have a fever or chills or if you have redness, swelling, or pain around your port insertion site.  Keep all follow-up visits as told by your health care provider. This information is not intended to replace advice given to you by your health care provider. Make sure you discuss any questions you have with your health care provider. Document Revised: 10/01/2017 Document Reviewed: 10/01/2017 Elsevier Patient Education   2021 [employer]  

## 2020-07-13 NOTE — Patient Instructions (Signed)
[facility]  Discharge Instructions: Thank you for choosing [facility] to provide your oncology and hematology care.   If you have a lab appointment with the [facility], please go directly to the [facility] and check in at the registration area.   Wear comfortable clothing and clothing appropriate for easy access to any Portacath or PICC line.   We strive to give you quality time with your provider. You may need to reschedule your appointment if you arrive late (15 or more minutes).  Arriving late affects you and other patients whose appointments are after yours.  Also, if you miss three or more appointments without notifying the office, you may be dismissed from the clinic at the provider's discretion.      For prescription refill requests, have your pharmacy contact our office and allow 72 hours for refills to be completed.    Today you received the following chemotherapy and/or immunotherapy agents: Herceptin, perjeta, docetaxel, carboplatin.      To help prevent nausea and vomiting after your treatment, we encourage you to take your nausea medication as directed.  BELOW ARE SYMPTOMS THAT SHOULD BE REPORTED IMMEDIATELY: . *FEVER GREATER THAN 100.4 F (38 C) OR HIGHER . *CHILLS OR SWEATING . *NAUSEA AND VOMITING THAT IS NOT CONTROLLED WITH YOUR NAUSEA MEDICATION . *UNUSUAL SHORTNESS OF BREATH . *UNUSUAL BRUISING OR BLEEDING . *URINARY PROBLEMS (pain or burning when urinating, or frequent urination) . *BOWEL PROBLEMS (unusual diarrhea, constipation, pain near the anus) . TENDERNESS IN MOUTH AND THROAT WITH OR WITHOUT PRESENCE OF ULCERS (sore throat, sores in mouth, or a toothache) . UNUSUAL RASH, SWELLING OR PAIN  . UNUSUAL VAGINAL DISCHARGE OR ITCHING   Items with * indicate a potential emergency and should be followed up as soon as possible or go to the Emergency Department if any problems should occur.  Please show the CHEMOTHERAPY  ALERT CARD or IMMUNOTHERAPY ALERT CARD at check-in to the Emergency Department and triage nurse.  Should you have questions after your visit or need to cancel or reschedule your appointment, please contact [facility]  Dept: [phone]  and follow the prompts.  Office hours are 8:00 a.m. to 4:30 p.m. Monday - Friday. Please note that voicemails left after 4:00 p.m. may not be returned until the following business day.  We are closed weekends and major holidays. You have access to a nurse at all times for urgent questions. Please call the main number to the clinic Dept: [phone] and follow the prompts.   For any non-urgent questions, you may also contact your provider using MyChart. We now offer e-Visits for anyone 16 and older to request care online for non-urgent symptoms. For details visit [email or web address].   Also download the MyChart app! Go to the app store, search "MyChart", open the app, select [facility], and log in with your MyChart username and password.  Due to Covid, a mask is required upon entering the hospital/clinic. If you do not have a mask, one will be given to you upon arrival. For doctor visits, patients may have 1 support person aged 24 or older with them. For treatment visits, patients cannot have anyone with them due to current Covid guidelines and our immunocompromised population.   Trastuzumab injection for infusion What is this medicine? TRASTUZUMAB (tras TOO zoo mab) is a monoclonal antibody. It is used to treat breast cancer and stomach cancer. This medicine may be used for other purposes; ask  your health care provider or pharmacist if you have questions. COMMON BRAND NAME(S): Herceptin, Galvin Proffer, Trazimera What should I tell my health care provider before I take this medicine? They need to know if you have any of these conditions:  heart disease  heart failure  lung or breathing disease, like  asthma  an unusual or allergic reaction to trastuzumab, benzyl alcohol, or other medications, foods, dyes, or preservatives  pregnant or trying to get pregnant  breast-feeding How should I use this medicine? This drug is given as an infusion into a vein. It is administered in a hospital or clinic by a specially trained health care professional. Talk to your pediatrician regarding the use of this medicine in children. This medicine is not approved for use in children. Overdosage: If you think you have taken too much of this medicine contact a poison control center or emergency room at once. NOTE: This medicine is only for you. Do not share this medicine with others. What if I miss a dose? It is important not to miss a dose. Call your doctor or health care professional if you are unable to keep an appointment. What may interact with this medicine? This medicine may interact with the following medications:  certain types of chemotherapy, such as daunorubicin, doxorubicin, epirubicin, and idarubicin This list may not describe all possible interactions. Give your health care provider a list of all the medicines, herbs, non-prescription drugs, or dietary supplements you use. Also tell them if you smoke, drink alcohol, or use illegal drugs. Some items may interact with your medicine. What should I watch for while using this medicine? Visit your doctor for checks on your progress. Report any side effects. Continue your course of treatment even though you feel ill unless your doctor tells you to stop. Call your doctor or health care professional for advice if you get a fever, chills or sore throat, or other symptoms of a cold or flu. Do not treat yourself. Try to avoid being around people who are sick. You may experience fever, chills and shaking during your first infusion. These effects are usually mild and can be treated with other medicines. Report any side effects during the infusion to your health  care professional. Fever and chills usually do not happen with later infusions. Do not become pregnant while taking this medicine or for 7 months after stopping it. Women should inform their doctor if they wish to become pregnant or think they might be pregnant. Women of child-bearing potential will need to have a negative pregnancy test before starting this medicine. There is a potential for serious side effects to an unborn child. Talk to your health care professional or pharmacist for more information. Do not breast-feed an infant while taking this medicine or for 7 months after stopping it. Women must use effective birth control with this medicine. What side effects may I notice from receiving this medicine? Side effects that you should report to your doctor or health care professional as soon as possible:  allergic reactions like skin rash, itching or hives, swelling of the face, lips, or tongue  chest pain or palpitations  cough  dizziness  feeling faint or lightheaded, falls  fever  general ill feeling or flu-like symptoms  signs of worsening heart failure like breathing problems; swelling in your legs and feet  unusually weak or tired Side effects that usually do not require medical attention (report to your doctor or health care professional if they continue or are  bothersome):  bone pain  changes in taste  diarrhea  joint pain  nausea/vomiting  weight loss This list may not describe all possible side effects. Call your doctor for medical advice about side effects. You may report side effects to FDA at 1-800-FDA-1088. Where should I keep my medicine? This drug is given in a hospital or clinic and will not be stored at home. NOTE: This sheet is a summary. It may not cover all possible information. If you have questions about this medicine, talk to your doctor, pharmacist, or health care provider.  2021 Elsevier/Gold Standard (2016-02-28 14:37:52)  Pertuzumab  injection What is this medicine? PERTUZUMAB (per TOOZ ue mab) is a monoclonal antibody. It is used to treat breast cancer. This medicine may be used for other purposes; ask your health care provider or pharmacist if you have questions. COMMON BRAND NAME(S): PERJETA What should I tell my health care provider before I take this medicine? They need to know if you have any of these conditions:  heart disease  heart failure  high blood pressure  history of irregular heart beat  recent or ongoing radiation therapy  an unusual or allergic reaction to pertuzumab, other medicines, foods, dyes, or preservatives  pregnant or trying to get pregnant  breast-feeding How should I use this medicine? This medicine is for infusion into a vein. It is given by a health care professional in a hospital or clinic setting. Talk to your pediatrician regarding the use of this medicine in children. Special care may be needed. Overdosage: If you think you have taken too much of this medicine contact a poison control center or emergency room at once. NOTE: This medicine is only for you. Do not share this medicine with others. What if I miss a dose? It is important not to miss your dose. Call your doctor or health care professional if you are unable to keep an appointment. What may interact with this medicine? Interactions are not expected. Give your health care provider a list of all the medicines, herbs, non-prescription drugs, or dietary supplements you use. Also tell them if you smoke, drink alcohol, or use illegal drugs. Some items may interact with your medicine. This list may not describe all possible interactions. Give your health care provider a list of all the medicines, herbs, non-prescription drugs, or dietary supplements you use. Also tell them if you smoke, drink alcohol, or use illegal drugs. Some items may interact with your medicine. What should I watch for while using this medicine? Your  condition will be monitored carefully while you are receiving this medicine. Report any side effects. Continue your course of treatment even though you feel ill unless your doctor tells you to stop. Do not become pregnant while taking this medicine or for 7 months after stopping it. Women should inform their doctor if they wish to become pregnant or think they might be pregnant. Women of child-bearing potential will need to have a negative pregnancy test before starting this medicine. There is a potential for serious side effects to an unborn child. Talk to your health care professional or pharmacist for more information. Do not breast-feed an infant while taking this medicine or for 7 months after stopping it. Women must use effective birth control with this medicine. Call your doctor or health care professional for advice if you get a fever, chills or sore throat, or other symptoms of a cold or flu. Do not treat yourself. Try to avoid being around people who are sick.  You may experience fever, chills, and headache during the infusion. Report any side effects during the infusion to your health care professional. What side effects may I notice from receiving this medicine? Side effects that you should report to your doctor or health care professional as soon as possible:  breathing problems  chest pain or palpitations  dizziness  feeling faint or lightheaded  fever or chills  skin rash, itching or hives  sore throat  swelling of the face, lips, or tongue  swelling of the legs or ankles  unusually weak or tired Side effects that usually do not require medical attention (report to your doctor or health care professional if they continue or are bothersome):  diarrhea  hair loss  nausea, vomiting  tiredness This list may not describe all possible side effects. Call your doctor for medical advice about side effects. You may report side effects to FDA at 1-800-FDA-1088. Where should I  keep my medicine? This drug is given in a hospital or clinic and will not be stored at home. NOTE: This sheet is a summary. It may not cover all possible information. If you have questions about this medicine, talk to your doctor, pharmacist, or health care provider.  2021 Elsevier/Gold Standard (2015-04-07 12:08:50)  Docetaxel injection What is this medicine? DOCETAXEL (doe se TAX el) is a chemotherapy drug. It targets fast dividing cells, like cancer cells, and causes these cells to die. This medicine is used to treat many types of cancers like breast cancer, certain stomach cancers, head and neck cancer, lung cancer, and prostate cancer. This medicine may be used for other purposes; ask your health care provider or pharmacist if you have questions. COMMON BRAND NAME(S): Docefrez, Taxotere What should I tell my health care provider before I take this medicine? They need to know if you have any of these conditions:  infection (especially a virus infection such as chickenpox, cold sores, or herpes)  liver disease  low blood counts, like low white cell, platelet, or red cell counts  an unusual or allergic reaction to docetaxel, polysorbate 80, other chemotherapy agents, other medicines, foods, dyes, or preservatives  pregnant or trying to get pregnant  breast-feeding How should I use this medicine? This drug is given as an infusion into a vein. It is administered in a hospital or clinic by a specially trained health care professional. Talk to your pediatrician regarding the use of this medicine in children. Special care may be needed. Overdosage: If you think you have taken too much of this medicine contact a poison control center or emergency room at once. NOTE: This medicine is only for you. Do not share this medicine with others. What if I miss a dose? It is important not to miss your dose. Call your doctor or health care professional if you are unable to keep an appointment. What  may interact with this medicine? Do not take this medicine with any of the following medications:  live virus vaccines This medicine may also interact with the following medications:  aprepitant  certain antibiotics like erythromycin or clarithromycin  certain antivirals for HIV or hepatitis  certain medicines for fungal infections like fluconazole, itraconazole, ketoconazole, posaconazole, or voriconazole  cimetidine  ciprofloxacin  conivaptan  cyclosporine  dronedarone  fluvoxamine  grapefruit juice  imatinib  verapamil This list may not describe all possible interactions. Give your health care provider a list of all the medicines, herbs, non-prescription drugs, or dietary supplements you use. Also tell them if you smoke,  drink alcohol, or use illegal drugs. Some items may interact with your medicine. What should I watch for while using this medicine? Your condition will be monitored carefully while you are receiving this medicine. You will need important blood work done while you are taking this medicine. Call your doctor or health care professional for advice if you get a fever, chills or sore throat, or other symptoms of a cold or flu. Do not treat yourself. This drug decreases your body's ability to fight infections. Try to avoid being around people who are sick. Some products may contain alcohol. Ask your health care professional if this medicine contains alcohol. Be sure to tell all health care professionals you are taking this medicine. Certain medicines, like metronidazole and disulfiram, can cause an unpleasant reaction when taken with alcohol. The reaction includes flushing, headache, nausea, vomiting, sweating, and increased thirst. The reaction can last from 30 minutes to several hours. You may get drowsy or dizzy. Do not drive, use machinery, or do anything that needs mental alertness until you know how this medicine affects you. Do not stand or sit up quickly,  especially if you are an older patient. This reduces the risk of dizzy or fainting spells. Alcohol may interfere with the effect of this medicine. Talk to your health care professional about your risk of cancer. You may be more at risk for certain types of cancer if you take this medicine. Do not become pregnant while taking this medicine or for 6 months after stopping it. Women should inform their doctor if they wish to become pregnant or think they might be pregnant. There is a potential for serious side effects to an unborn child. Talk to your health care professional or pharmacist for more information. Do not breast-feed an infant while taking this medicine or for 1 week after stopping it. Males who get this medicine must use a condom during sex with females who can get pregnant. If you get a woman pregnant, the baby could have birth defects. The baby could die before they are born. You will need to continue wearing a condom for 3 months after stopping the medicine. Tell your health care provider right away if your partner becomes pregnant while you are taking this medicine. This may interfere with the ability to father a child. You should talk to your doctor or health care professional if you are concerned about your fertility. What side effects may I notice from receiving this medicine? Side effects that you should report to your doctor or health care professional as soon as possible:  allergic reactions like skin rash, itching or hives, swelling of the face, lips, or tongue  blurred vision  breathing problems  changes in vision  low blood counts - This drug may decrease the number of white blood cells, red blood cells and platelets. You may be at increased risk for infections and bleeding.  nausea and vomiting  pain, redness or irritation at site where injected  pain, tingling, numbness in the hands or feet  redness, blistering, peeling, or loosening of the skin, including inside the  mouth  signs of decreased platelets or bleeding - bruising, pinpoint red spots on the skin, black, tarry stools, nosebleeds  signs of decreased red blood cells - unusually weak or tired, fainting spells, lightheadedness  signs of infection - fever or chills, cough, sore throat, pain or difficulty passing urine  swelling of the ankle, feet, hands Side effects that usually do not require medical attention (report to  your doctor or health care professional if they continue or are bothersome):  constipation  diarrhea  fingernail or toenail changes  hair loss  loss of appetite  mouth sores  muscle pain This list may not describe all possible side effects. Call your doctor for medical advice about side effects. You may report side effects to FDA at 1-800-FDA-1088. Where should I keep my medicine? This drug is given in a hospital or clinic and will not be stored at home. NOTE: This sheet is a summary. It may not cover all possible information. If you have questions about this medicine, talk to your doctor, pharmacist, or health care provider.  2021 Elsevier/Gold Standard (2019-02-02 19:50:31)  Carboplatin injection What is this medicine? CARBOPLATIN (KAR boe pla tin) is a chemotherapy drug. It targets fast dividing cells, like cancer cells, and causes these cells to die. This medicine is used to treat ovarian cancer and many other cancers. This medicine may be used for other purposes; ask your health care provider or pharmacist if you have questions. COMMON BRAND NAME(S): Paraplatin What should I tell my health care provider before I take this medicine? They need to know if you have any of these conditions:  blood disorders  hearing problems  kidney disease  recent or ongoing radiation therapy  an unusual or allergic reaction to carboplatin, cisplatin, other chemotherapy, other medicines, foods, dyes, or preservatives  pregnant or trying to get  pregnant  breast-feeding How should I use this medicine? This drug is usually given as an infusion into a vein. It is administered in a hospital or clinic by a specially trained health care professional. Talk to your pediatrician regarding the use of this medicine in children. Special care may be needed. Overdosage: If you think you have taken too much of this medicine contact a poison control center or emergency room at once. NOTE: This medicine is only for you. Do not share this medicine with others. What if I miss a dose? It is important not to miss a dose. Call your doctor or health care professional if you are unable to keep an appointment. What may interact with this medicine?  medicines for seizures  medicines to increase blood counts like filgrastim, pegfilgrastim, sargramostim  some antibiotics like amikacin, gentamicin, neomycin, streptomycin, tobramycin  vaccines Talk to your doctor or health care professional before taking any of these medicines:  acetaminophen  aspirin  ibuprofen  ketoprofen  naproxen This list may not describe all possible interactions. Give your health care provider a list of all the medicines, herbs, non-prescription drugs, or dietary supplements you use. Also tell them if you smoke, drink alcohol, or use illegal drugs. Some items may interact with your medicine. What should I watch for while using this medicine? Your condition will be monitored carefully while you are receiving this medicine. You will need important blood work done while you are taking this medicine. This drug may make you feel generally unwell. This is not uncommon, as chemotherapy can affect healthy cells as well as cancer cells. Report any side effects. Continue your course of treatment even though you feel ill unless your doctor tells you to stop. In some cases, you may be given additional medicines to help with side effects. Follow all directions for their use. Call your  doctor or health care professional for advice if you get a fever, chills or sore throat, or other symptoms of a cold or flu. Do not treat yourself. This drug decreases your body's ability to  fight infections. Try to avoid being around people who are sick. This medicine may increase your risk to bruise or bleed. Call your doctor or health care professional if you notice any unusual bleeding. Be careful brushing and flossing your teeth or using a toothpick because you may get an infection or bleed more easily. If you have any dental work done, tell your dentist you are receiving this medicine. Avoid taking products that contain aspirin, acetaminophen, ibuprofen, naproxen, or ketoprofen unless instructed by your doctor. These medicines may hide a fever. Do not become pregnant while taking this medicine. Women should inform their doctor if they wish to become pregnant or think they might be pregnant. There is a potential for serious side effects to an unborn child. Talk to your health care professional or pharmacist for more information. Do not breast-feed an infant while taking this medicine. What side effects may I notice from receiving this medicine? Side effects that you should report to your doctor or health care professional as soon as possible:  allergic reactions like skin rash, itching or hives, swelling of the face, lips, or tongue  signs of infection - fever or chills, cough, sore throat, pain or difficulty passing urine  signs of decreased platelets or bleeding - bruising, pinpoint red spots on the skin, black, tarry stools, nosebleeds  signs of decreased red blood cells - unusually weak or tired, fainting spells, lightheadedness  breathing problems  changes in hearing  changes in vision  chest pain  high blood pressure  low blood counts - This drug may decrease the number of white blood cells, red blood cells and platelets. You may be at increased risk for infections and  bleeding.  nausea and vomiting  pain, swelling, redness or irritation at the injection site  pain, tingling, numbness in the hands or feet  problems with balance, talking, walking  trouble passing urine or change in the amount of urine Side effects that usually do not require medical attention (report to your doctor or health care professional if they continue or are bothersome):  hair loss  loss of appetite  metallic taste in the mouth or changes in taste This list may not describe all possible side effects. Call your doctor for medical advice about side effects. You may report side effects to FDA at 1-800-FDA-1088. Where should I keep my medicine? This drug is given in a hospital or clinic and will not be stored at home. NOTE: This sheet is a summary. It may not cover all possible information. If you have questions about this medicine, talk to your doctor, pharmacist, or health care provider.  2021 Elsevier/Gold Standard (2007-06-10 14:38:05)

## 2020-07-13 NOTE — Progress Notes (Signed)
Oncology Clinical Pharmacist Note   Janet [name] is a 55 y.o. female with a diagnosis of breast cancer who is starting neoadjuvant TCHP under the care of Dr. Nicholas Lose. Neulasta OnPro initially added for day 1 of each cycle but removed today per insurance and Ziextenzo added to day 3 of each cycle (1-6). Provider and nursing made aware of changes.  Patient informed by nursing of appointment for Ziextenzo on 07/15/20. Treatment plan communication order updated.  Clinical pharmacy will continue to support Janet [name] and Dr. Lindi Adie as needed.  Raina Mina, [place],  07/13/2020  12:08 PM

## 2020-07-13 NOTE — Research (Signed)
[id number] - TREATMENT OF REFRACTORY NAUSEA  07/13/20    10:20AM  LABS: Research labs were collected per protocol. Patient tolerated procedure well.   VISIT: Spoke with Madilyn Fireman during infusion about questionnaires for Cycle 1. Gave patient paper copies of questionnaires as well as 4 day diary on nausea/vomiting. Informed patient how to complete 4 day diary and wrote specific dates on when to complete. Patient expressed understanding. Informed patient she could use pre-paid envelope to return 4 day diary or this coordinator could collect them at her next visit (Tuesday 07/19/20). Plan is to call patient on Monday (Day 6) 07/18/20 to check on nausea. Patient thanked for her time and continued participation in the above mentioned study.  Carol Ada, RT(R)(T) Clinical Research Coordinator

## 2020-07-14 ENCOUNTER — Telehealth: Payer: Self-pay | Admitting: *Deleted

## 2020-07-15 ENCOUNTER — Other Ambulatory Visit: Payer: Self-pay

## 2020-07-15 ENCOUNTER — Inpatient Hospital Stay: Payer: 59

## 2020-07-15 VITALS — BP 123/79 | HR 69 | Resp 18

## 2020-07-15 DIAGNOSIS — C50512 Malignant neoplasm of lower-outer quadrant of left female breast: Secondary | ICD-10-CM | POA: Diagnosis not present

## 2020-07-15 DIAGNOSIS — Z170 Estrogen receptor positive status [ER+]: Secondary | ICD-10-CM

## 2020-07-15 MED ORDER — PEGFILGRASTIM-BMEZ 6 MG/0.6ML ~~LOC~~ SOSY
6.0000 mg | PREFILLED_SYRINGE | Freq: Once | SUBCUTANEOUS | Status: AC
  Administered 2020-07-15: 6 mg via SUBCUTANEOUS

## 2020-07-15 MED ORDER — PEGFILGRASTIM-BMEZ 6 MG/0.6ML ~~LOC~~ SOSY
PREFILLED_SYRINGE | SUBCUTANEOUS | Status: AC
  Filled 2020-07-15: qty 0.6

## 2020-07-15 NOTE — Patient Instructions (Signed)
Pegfilgrastim injection What is this medicine? PEGFILGRASTIM (PEG fil gra stim) is a long-acting granulocyte colony-stimulating factor that stimulates the growth of neutrophils, a type of white blood cell important in the body's fight against infection. It is used to reduce the incidence of fever and infection in patients with certain types of cancer who are receiving chemotherapy that affects the bone marrow, and to increase survival after being exposed to high doses of radiation. This medicine may be used for other purposes; ask your health care provider or pharmacist if you have questions. COMMON BRAND NAME(S): Fulphila, Neulasta, Nyvepria, UDENYCA, Ziextenzo What should I tell my health care provider before I take this medicine? They need to know if you have any of these conditions:  kidney disease  latex allergy  ongoing radiation therapy  sickle cell disease  skin reactions to acrylic adhesives (On-Body Injector only)  an unusual or allergic reaction to pegfilgrastim, filgrastim, other medicines, foods, dyes, or preservatives  pregnant or trying to get pregnant  breast-feeding How should I use this medicine? This medicine is for injection under the skin. If you get this medicine at home, you will be taught how to prepare and give the pre-filled syringe or how to use the On-body Injector. Refer to the patient Instructions for Use for detailed instructions. Use exactly as directed. Tell your healthcare provider immediately if you suspect that the On-body Injector may not have performed as intended or if you suspect the use of the On-body Injector resulted in a missed or partial dose. It is important that you put your used needles and syringes in a special sharps container. Do not put them in a trash can. If you do not have a sharps container, call your pharmacist or healthcare provider to get one. Talk to your pediatrician regarding the use of this medicine in children. While this drug  may be prescribed for selected conditions, precautions do apply. Overdosage: If you think you have taken too much of this medicine contact a poison control center or emergency room at once. NOTE: This medicine is only for you. Do not share this medicine with others. What if I miss a dose? It is important not to miss your dose. Call your doctor or health care professional if you miss your dose. If you miss a dose due to an On-body Injector failure or leakage, a new dose should be administered as soon as possible using a single prefilled syringe for manual use. What may interact with this medicine? Interactions have not been studied. This list may not describe all possible interactions. Give your health care provider a list of all the medicines, herbs, non-prescription drugs, or dietary supplements you use. Also tell them if you smoke, drink alcohol, or use illegal drugs. Some items may interact with your medicine. What should I watch for while using this medicine? Your condition will be monitored carefully while you are receiving this medicine. You may need blood work done while you are taking this medicine. Talk to your health care provider about your risk of cancer. You may be more at risk for certain types of cancer if you take this medicine. If you are going to need a MRI, CT scan, or other procedure, tell your doctor that you are using this medicine (On-Body Injector only). What side effects may I notice from receiving this medicine? Side effects that you should report to your doctor or health care professional as soon as possible:  allergic reactions (skin rash, itching or hives, swelling of   the face, lips, or tongue)  back pain  dizziness  fever  pain, redness, or irritation at site where injected  pinpoint red spots on the skin  red or dark-brown urine  shortness of breath or breathing problems  stomach or side pain, or pain at the shoulder  swelling  tiredness  trouble  passing urine or change in the amount of urine  unusual bruising or bleeding Side effects that usually do not require medical attention (report to your doctor or health care professional if they continue or are bothersome):  bone pain  muscle pain This list may not describe all possible side effects. Call your doctor for medical advice about side effects. You may report side effects to FDA at 1-800-FDA-1088. Where should I keep my medicine? Keep out of the reach of children. If you are using this medicine at home, you will be instructed on how to store it. Throw away any unused medicine after the expiration date on the label. NOTE: This sheet is a summary. It may not cover all possible information. If you have questions about this medicine, talk to your doctor, pharmacist, or health care provider.  2021 Elsevier/Gold Standard (2019-03-27 13:20:51)  

## 2020-07-18 ENCOUNTER — Ambulatory Visit: Payer: BLUE CROSS/BLUE SHIELD | Admitting: Hematology and Oncology

## 2020-07-18 ENCOUNTER — Telehealth: Payer: Self-pay | Admitting: Radiology

## 2020-07-18 NOTE — Telephone Encounter (Signed)
[id number] - TREATMENT OF REFRACTORY NAUSEA  07/18/20      9:41AM  PHONE CALL: Confirmed I was speaking with Janet [name]. Informed patient call was to check in on her after first infusion and to verify if she had any nausea or vomiting. Patient stated she had passing nausea here or there, but it was overall well controlled and no vomiting. She stated that other than fatigue, she has overall done well and has completed surveys via e-mail. Patient was thanked for her time and continued participation in the above mentioned study. Informed patient, this coordinator would see her tomorrow to collect 4 day nausea record or she could mail them as well. Patient prefers to bring them tomorrow.   Phone call was made on Day 6 (07/18/20) following first infusion, because Day 4 was on a non-working day, Saturday (07/16/20).  Carol Ada, RT(R)(T) Clinical Research Coordinator

## 2020-07-18 NOTE — Progress Notes (Signed)
Patient Care Team: Charlynn Court, NP as PCP - General (Nurse Practitioner) Rockwell Germany, RN as Oncology Nurse Navigator Mauro Kaufmann, RN as Oncology Nurse Navigator  DIAGNOSIS:    ICD-10-CM   1. Malignant neoplasm of lower-outer quadrant of left breast of female, estrogen receptor positive ([place])  C50.512    Z17.0     SUMMARY OF ONCOLOGIC HISTORY: Oncology History  Malignant neoplasm of lower-outer quadrant of left breast of female, estrogen receptor positive ([place])  06/23/2020 Initial Diagnosis   Normal mammogram September 2021.  December 2021: Following trauma to the breast she felt a lump but for variety of reasons work-up was postponed.  Mammogram revealed 2 tumors measuring 7 cm individually 4 cm and 3 cm, 3 axillary lymph nodes, biopsy revealed grade 3 IDC ER 10%, PR 0%, Ki-67 40%, HER-2 +3+ by IHC, lymph node also positive   06/28/2020 Cancer Staging   Staging form: Breast, AJCC 8th Edition - Clinical stage from 06/28/2020: Stage IIB (cT2, cN1, cM0, G3, ER+, PR-, HER2+) - Signed by Nicholas Lose, MD on 06/28/2020 Stage prefix: Initial diagnosis Histologic grading system: 3 grade system   07/13/2020 -  Chemotherapy    Patient is on Treatment Plan: BREAST  DOCETAXEL + CARBOPLATIN + TRASTUZUMAB + PERTUZUMAB  (TCHP) Q21D         CHIEF COMPLIANT: Cycle 1 Day 7 TCH Perjeta   INTERVAL HISTORY: Janet [name] is a 55 y.o. with above-mentioned history of left breast cancer currently on neoadjuvant chemotherapy with [place] Perjeta. She presents to the clinic today for a toxicity check following cycle 1.   ALLERGIES:  has No Known Allergies.  MEDICATIONS:  Current Outpatient Medications  Medication Sig Dispense Refill  . dexamethasone (DECADRON) 4 MG tablet Take 1 tablet (4 mg total) by mouth daily. Take 1 tablet day before chemo and 1 tablet day after chemo with food 12 tablet 0  . lidocaine-prilocaine (EMLA) cream Apply to affected area once 30 g 3  . ondansetron (ZOFRAN) 8  MG tablet Take 1 tablet (8 mg total) by mouth 2 (two) times daily as needed (Nausea or vomiting). Start on the third day after chemotherapy. 30 tablet 1  . prochlorperazine (COMPAZINE) 10 MG tablet Take 1 tablet (10 mg total) by mouth every 6 (six) hours as needed (Nausea or vomiting). 30 tablet 1  . traMADol (ULTRAM) 50 MG tablet Take 2 tablets (100 mg total) by mouth every 6 (six) hours as needed. 8 tablet 0   No current facility-administered medications for this visit.    PHYSICAL EXAMINATION: ECOG PERFORMANCE STATUS: 1 - Symptomatic but completely ambulatory  Vitals:   07/19/20 1411  BP: 108/70  Pulse: 95  Resp: 18  Temp: 97.9 F (36.6 C)  SpO2: 100%   Filed Weights   07/19/20 1411  Weight: 148 lb 4.8 oz (67.3 kg)    LABORATORY DATA:  I have reviewed the data as listed CMP Latest Ref Rng & Units 07/13/2020  Glucose 70 - 99 mg/dL 98  BUN 6 - 20 mg/dL 13  Creatinine 0.44 - 1.00 mg/dL 0.74  Sodium 135 - 145 mmol/L 143  Potassium 3.5 - 5.1 mmol/L 4.1  Chloride 98 - 111 mmol/L 107  CO2 22 - 32 mmol/L 27  Calcium 8.9 - 10.3 mg/dL 9.7  Total Protein 6.5 - 8.1 g/dL 7.3  Total Bilirubin 0.3 - 1.2 mg/dL 0.4  Alkaline Phos 38 - 126 U/L 66  AST 15 - 41 U/L 11(L)  ALT 0 -  44 U/L 9    Lab Results  Component Value Date   WBC 3.9 (L) 07/19/2020   HGB 12.9 07/19/2020   HCT 38.2 07/19/2020   MCV 89.3 07/19/2020   PLT 159 07/19/2020   NEUTROABS PENDING 07/19/2020    ASSESSMENT & PLAN:  Malignant neoplasm of lower-outer quadrant of left breast of female, estrogen receptor positive ([place]) 06/23/2020:Normal mammogram September 2021. December 2021: Following trauma to the breast she felt a lump but for variety of reasons work-up was postponed. 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  Treatment Plan: 1. Neoadjuvant chemotherapy  with TCH Perjeta 6 cycles followed by Herceptin Perjeta maintenance versus Kadcyla maintenance (based on response to neoadjuvant chemo) for 1 year 2. Followed by breast conserving surgery if possible with axillary lymph node dissection 3. Followed by adjuvant radiation therapy 4.  Followed by antiestrogen therapy 5.Followed by neratinib MRI liver 07/11/20: Hepatic cysts Bone scan and CT CAP: No mets Breast MRI 06/28/2020: 2 large left breast masses each 0.5 cm, overall 7 cm, third contiguous mass 3 cm (could be a lymph node, abutting pectoralis muscle), 4 lymph nodes left axilla ---------------------------------------------------------------------------------------------------------------------------- Current Treatment: Cycle 1 day 8 TCHP ECHO: 07/11/20: EF 60-65% Labs reviewed  Chemo Toxicities: Denies any nausea or vomiting. Had couple of episodes of loose stools but no diarrhea.  She took Imodium which solve the problem. Fatigue day 4 today 6 Monitoring blood counts.  RTC in 2 weeks for cycle 2    No orders of the defined types were placed in this encounter.  The patient has a good understanding of the overall plan. she agrees with it. she will call with any problems that may develop before the next visit here.  Total time spent: 30 mins including face to face time and time spent for planning, charting and coordination of care  Rulon Eisenmenger, MD, MPH 07/19/2020  I, Cloyde Reams Dorshimer, am acting as scribe for Dr. Nicholas Lose.  I have reviewed the above documentation for accuracy and completeness, and I agree with the above.

## 2020-07-18 NOTE — Assessment & Plan Note (Signed)
06/23/2020:Normal mammogram September 2021. December 2021: Following trauma to the breast she felt a lump but for variety of reasons work-up was postponed. 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  Treatment Plan: 1. Neoadjuvant chemotherapy with TCH Perjeta 6 cycles followed by Herceptin Perjeta maintenance versus Kadcyla maintenance (based on response to neoadjuvant chemo) for 1 year 2. Followed by breast conserving surgery if possible with axillary lymph node dissection 3. Followed by adjuvant radiation therapy 4.  Followed by antiestrogen therapy 5.Followed by neratinib MRI liver 07/11/20: Hepatic cysts Bone scan and CT CAP: No mets Breast MRI 06/28/2020: 2 large left breast masses each 0.5 cm, overall 7 cm, third contiguous mass 3 cm (could be a lymph node, abutting pectoralis muscle), 4 lymph nodes left axilla ---------------------------------------------------------------------------------------------------------------------------- Current Treatment: Cycle 1 day 8 TCHP ECHO: 07/11/20: EF 60-65% Labs reviewed  Chemo Toxicities:  RTC in 2 weeks for cycle 2

## 2020-07-19 ENCOUNTER — Other Ambulatory Visit: Payer: 59

## 2020-07-19 ENCOUNTER — Inpatient Hospital Stay: Payer: 59 | Attending: Hematology and Oncology

## 2020-07-19 ENCOUNTER — Other Ambulatory Visit: Payer: Self-pay

## 2020-07-19 ENCOUNTER — Encounter: Payer: Self-pay | Admitting: Radiology

## 2020-07-19 ENCOUNTER — Encounter: Payer: Self-pay | Admitting: *Deleted

## 2020-07-19 ENCOUNTER — Inpatient Hospital Stay (HOSPITAL_BASED_OUTPATIENT_CLINIC_OR_DEPARTMENT_OTHER): Payer: 59 | Admitting: Hematology and Oncology

## 2020-07-19 DIAGNOSIS — K7689 Other specified diseases of liver: Secondary | ICD-10-CM | POA: Insufficient documentation

## 2020-07-19 DIAGNOSIS — Z5112 Encounter for antineoplastic immunotherapy: Secondary | ICD-10-CM | POA: Diagnosis present

## 2020-07-19 DIAGNOSIS — Z5189 Encounter for other specified aftercare: Secondary | ICD-10-CM | POA: Diagnosis not present

## 2020-07-19 DIAGNOSIS — C50512 Malignant neoplasm of lower-outer quadrant of left female breast: Secondary | ICD-10-CM

## 2020-07-19 DIAGNOSIS — Z170 Estrogen receptor positive status [ER+]: Secondary | ICD-10-CM

## 2020-07-19 DIAGNOSIS — Z79899 Other long term (current) drug therapy: Secondary | ICD-10-CM | POA: Diagnosis not present

## 2020-07-19 DIAGNOSIS — R5383 Other fatigue: Secondary | ICD-10-CM | POA: Diagnosis not present

## 2020-07-19 DIAGNOSIS — Z95828 Presence of other vascular implants and grafts: Secondary | ICD-10-CM | POA: Insufficient documentation

## 2020-07-19 DIAGNOSIS — Z5111 Encounter for antineoplastic chemotherapy: Secondary | ICD-10-CM | POA: Insufficient documentation

## 2020-07-19 LAB — CBC WITH DIFFERENTIAL (CANCER CENTER ONLY)
Abs Immature Granulocytes: 0.03 10*3/uL (ref 0.00–0.07)
Basophils Absolute: 0.1 10*3/uL (ref 0.0–0.1)
Basophils Relative: 2 %
Eosinophils Absolute: 0 10*3/uL (ref 0.0–0.5)
Eosinophils Relative: 1 %
HCT: 38.2 % (ref 36.0–46.0)
Hemoglobin: 12.9 g/dL (ref 12.0–15.0)
Immature Granulocytes: 1 %
Lymphocytes Relative: 49 %
Lymphs Abs: 1.9 10*3/uL (ref 0.7–4.0)
MCH: 30.1 pg (ref 26.0–34.0)
MCHC: 33.8 g/dL (ref 30.0–36.0)
MCV: 89.3 fL (ref 80.0–100.0)
Monocytes Absolute: 0.7 10*3/uL (ref 0.1–1.0)
Monocytes Relative: 18 %
Neutro Abs: 1.1 10*3/uL — ABNORMAL LOW (ref 1.7–7.7)
Neutrophils Relative %: 29 %
Platelet Count: 159 10*3/uL (ref 150–400)
RBC: 4.28 MIL/uL (ref 3.87–5.11)
RDW: 12 % (ref 11.5–15.5)
WBC Count: 3.9 10*3/uL — ABNORMAL LOW (ref 4.0–10.5)
nRBC: 0 % (ref 0.0–0.2)

## 2020-07-19 LAB — CMP (CANCER CENTER ONLY)
ALT: 20 U/L (ref 0–44)
AST: 12 U/L — ABNORMAL LOW (ref 15–41)
Albumin: 3.9 g/dL (ref 3.5–5.0)
Alkaline Phosphatase: 94 U/L (ref 38–126)
Anion gap: 8 (ref 5–15)
BUN: 11 mg/dL (ref 6–20)
CO2: 29 mmol/L (ref 22–32)
Calcium: 9.2 mg/dL (ref 8.9–10.3)
Chloride: 101 mmol/L (ref 98–111)
Creatinine: 0.71 mg/dL (ref 0.44–1.00)
GFR, Estimated: >60 mL/min (ref >60)
Glucose, Bld: 121 mg/dL — ABNORMAL HIGH (ref 70–99)
Potassium: 4 mmol/L (ref 3.5–5.1)
Sodium: 138 mmol/L (ref 135–145)
Total Bilirubin: 0.5 mg/dL (ref 0.3–1.2)
Total Protein: 7.1 g/dL (ref 6.5–8.1)

## 2020-07-19 MED ORDER — SODIUM CHLORIDE 0.9% FLUSH
10.0000 mL | INTRAVENOUS | Status: DC | PRN
  Filled 2020-07-19: qty 10

## 2020-07-19 MED ORDER — HEPARIN SOD (PORK) LOCK FLUSH 100 UNIT/ML IV SOLN
500.0000 [IU] | Freq: Once | INTRAVENOUS | Status: DC | PRN
  Filled 2020-07-19: qty 5

## 2020-07-19 NOTE — Research (Signed)
[id number] - TREATMENT OF REFRACTORY NAUSEA  07/19/20     2:15PM  COLLECTION VISIT: Collected [facility] home record and [facility] Antiemesis tool documents from patient. This coordinator verified completeness of records. Patient thanked for her time and participation in the above mentioned study.   Carol Ada, RT(R)(T) Clinical Research Coordinator

## 2020-07-21 ENCOUNTER — Telehealth: Payer: Self-pay | Admitting: *Deleted

## 2020-07-21 NOTE — Telephone Encounter (Signed)
Leave request received for Madilyn Fireman per [facility].  Second leave request included from [place] for spouse Jaxyn Mestas.  Completed, signed FMLA forms faxed today both received successful transfer notices.  E-mailed to [email or web address]   Originals at front office ready for [facility] Mail Department pick-up for mail delivery to [address] [place], [place], [address].

## 2020-08-03 ENCOUNTER — Inpatient Hospital Stay: Payer: 59

## 2020-08-03 ENCOUNTER — Other Ambulatory Visit: Payer: 59

## 2020-08-03 ENCOUNTER — Other Ambulatory Visit: Payer: Self-pay

## 2020-08-03 ENCOUNTER — Inpatient Hospital Stay (HOSPITAL_BASED_OUTPATIENT_CLINIC_OR_DEPARTMENT_OTHER): Payer: 59 | Admitting: Hematology and Oncology

## 2020-08-03 ENCOUNTER — Encounter: Payer: Self-pay | Admitting: Radiology

## 2020-08-03 DIAGNOSIS — Z95828 Presence of other vascular implants and grafts: Secondary | ICD-10-CM

## 2020-08-03 DIAGNOSIS — Z170 Estrogen receptor positive status [ER+]: Secondary | ICD-10-CM

## 2020-08-03 DIAGNOSIS — C50512 Malignant neoplasm of lower-outer quadrant of left female breast: Secondary | ICD-10-CM | POA: Diagnosis not present

## 2020-08-03 DIAGNOSIS — Z5111 Encounter for antineoplastic chemotherapy: Secondary | ICD-10-CM | POA: Diagnosis not present

## 2020-08-03 LAB — CMP (CANCER CENTER ONLY)
ALT: 10 U/L (ref 0–44)
AST: 10 U/L — ABNORMAL LOW (ref 15–41)
Albumin: 3.5 g/dL (ref 3.5–5.0)
Alkaline Phosphatase: 96 U/L (ref 38–126)
Anion gap: 9 (ref 5–15)
BUN: 14 mg/dL (ref 6–20)
CO2: 27 mmol/L (ref 22–32)
Calcium: 9.3 mg/dL (ref 8.9–10.3)
Chloride: 107 mmol/L (ref 98–111)
Creatinine: 0.62 mg/dL (ref 0.44–1.00)
GFR, Estimated: >60 mL/min (ref >60)
Glucose, Bld: 88 mg/dL (ref 70–99)
Potassium: 3.6 mmol/L (ref 3.5–5.1)
Sodium: 143 mmol/L (ref 135–145)
Total Bilirubin: 0.2 mg/dL — ABNORMAL LOW (ref 0.3–1.2)
Total Protein: 6.8 g/dL (ref 6.5–8.1)

## 2020-08-03 LAB — CBC WITH DIFFERENTIAL (CANCER CENTER ONLY)
Abs Immature Granulocytes: 0.06 10*3/uL (ref 0.00–0.07)
Basophils Absolute: 0.1 10*3/uL (ref 0.0–0.1)
Basophils Relative: 1 %
Eosinophils Absolute: 0 10*3/uL (ref 0.0–0.5)
Eosinophils Relative: 0 %
HCT: 35 % — ABNORMAL LOW (ref 36.0–46.0)
Hemoglobin: 11.6 g/dL — ABNORMAL LOW (ref 12.0–15.0)
Immature Granulocytes: 0 %
Lymphocytes Relative: 16 %
Lymphs Abs: 2.4 10*3/uL (ref 0.7–4.0)
MCH: 30.3 pg (ref 26.0–34.0)
MCHC: 33.1 g/dL (ref 30.0–36.0)
MCV: 91.4 fL (ref 80.0–100.0)
Monocytes Absolute: 1.5 10*3/uL — ABNORMAL HIGH (ref 0.1–1.0)
Monocytes Relative: 10 %
Neutro Abs: 10.5 10*3/uL — ABNORMAL HIGH (ref 1.7–7.7)
Neutrophils Relative %: 73 %
Platelet Count: 292 10*3/uL (ref 150–400)
RBC: 3.83 MIL/uL — ABNORMAL LOW (ref 3.87–5.11)
RDW: 13.2 % (ref 11.5–15.5)
WBC Count: 14.5 10*3/uL — ABNORMAL HIGH (ref 4.0–10.5)
nRBC: 0 % (ref 0.0–0.2)

## 2020-08-03 MED ORDER — TRASTUZUMAB-ANNS CHEMO 150 MG IV SOLR
6.0000 mg/kg | Freq: Once | INTRAVENOUS | Status: AC
  Administered 2020-08-03: 420 mg via INTRAVENOUS
  Filled 2020-08-03: qty 20

## 2020-08-03 MED ORDER — PALONOSETRON HCL INJECTION 0.25 MG/5ML
INTRAVENOUS | Status: AC
  Filled 2020-08-03: qty 5

## 2020-08-03 MED ORDER — SODIUM CHLORIDE 0.9 % IV SOLN
10.0000 mg | Freq: Once | INTRAVENOUS | Status: AC
  Administered 2020-08-03: 10 mg via INTRAVENOUS
  Filled 2020-08-03: qty 10

## 2020-08-03 MED ORDER — ACETAMINOPHEN 325 MG PO TABS
ORAL_TABLET | ORAL | Status: AC
  Filled 2020-08-03: qty 2

## 2020-08-03 MED ORDER — SODIUM CHLORIDE 0.9 % IV SOLN
420.0000 mg | Freq: Once | INTRAVENOUS | Status: AC
  Administered 2020-08-03: 420 mg via INTRAVENOUS
  Filled 2020-08-03: qty 14

## 2020-08-03 MED ORDER — SODIUM CHLORIDE 0.9 % IV SOLN
150.0000 mg | Freq: Once | INTRAVENOUS | Status: AC
  Administered 2020-08-03: 150 mg via INTRAVENOUS
  Filled 2020-08-03: qty 150

## 2020-08-03 MED ORDER — SODIUM CHLORIDE 0.9 % IV SOLN
75.0000 mg/m2 | Freq: Once | INTRAVENOUS | Status: AC
  Administered 2020-08-03: 140 mg via INTRAVENOUS
  Filled 2020-08-03: qty 14

## 2020-08-03 MED ORDER — ACETAMINOPHEN 325 MG PO TABS
650.0000 mg | ORAL_TABLET | Freq: Once | ORAL | Status: AC
  Administered 2020-08-03: 650 mg via ORAL

## 2020-08-03 MED ORDER — SODIUM CHLORIDE 0.9% FLUSH
10.0000 mL | INTRAVENOUS | Status: DC | PRN
  Administered 2020-08-03: 10 mL
  Filled 2020-08-03: qty 10

## 2020-08-03 MED ORDER — PALONOSETRON HCL INJECTION 0.25 MG/5ML
0.2500 mg | Freq: Once | INTRAVENOUS | Status: AC
Start: 2020-08-03 — End: 2020-08-03
  Administered 2020-08-03: 0.25 mg via INTRAVENOUS

## 2020-08-03 MED ORDER — HEPARIN SOD (PORK) LOCK FLUSH 100 UNIT/ML IV SOLN
500.0000 [IU] | Freq: Once | INTRAVENOUS | Status: AC | PRN
  Administered 2020-08-03: 500 [IU]
  Filled 2020-08-03: qty 5

## 2020-08-03 MED ORDER — DIPHENHYDRAMINE HCL 25 MG PO CAPS
ORAL_CAPSULE | ORAL | Status: AC
  Filled 2020-08-03: qty 2

## 2020-08-03 MED ORDER — DIPHENHYDRAMINE HCL 25 MG PO CAPS
50.0000 mg | ORAL_CAPSULE | Freq: Once | ORAL | Status: AC
Start: 2020-08-03 — End: 2020-08-03
  Administered 2020-08-03: 50 mg via ORAL

## 2020-08-03 MED ORDER — SODIUM CHLORIDE 0.9 % IV SOLN
Freq: Once | INTRAVENOUS | Status: AC
  Filled 2020-08-03: qty 250

## 2020-08-03 MED ORDER — SODIUM CHLORIDE 0.9 % IV SOLN
700.0000 mg | Freq: Once | INTRAVENOUS | Status: AC
  Administered 2020-08-03: 700 mg via INTRAVENOUS
  Filled 2020-08-03: qty 70

## 2020-08-03 NOTE — Assessment & Plan Note (Signed)
06/23/2020:Normal mammogram September 2021. December 2021: Following trauma to the breast she felt a lump but for variety of reasons work-up was postponed. 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  Treatment Plan: 1. Neoadjuvant chemotherapy with TCH Perjeta 6 cycles followed by Herceptin Perjeta maintenance versus Kadcyla maintenance (based on response to neoadjuvant chemo) for 1 year 2. Followed by breast conserving surgery if possible withaxillary lymph node dissection 3. Followed by adjuvant radiation therapy 4.Followed by antiestrogen therapy 5.Followed by neratinib MRI liver 07/11/20: Hepatic cysts Bone scan and CT CAP: No mets Breast MRI 06/28/2020: 2 large left breast masses each 0.5 cm, overall 7 cm, third contiguous mass 3 cm (could be a lymph node, abutting pectoralis muscle), 4 lymph nodes left axilla ---------------------------------------------------------------------------------------------------------------------------- Current Treatment:Cycle 2 TCHP ECHO: 07/11/20: EF 60-65% Labs reviewed  Chemo Toxicities: Denies any nausea or vomiting. 1. Had couple of episodes of loose stools but no diarrhea.  She took Imodium which solve the problem. 2. Fatigue day 4 today 6 Monitoring blood counts.  RTC in 3 weeks for cycle 3

## 2020-08-03 NOTE — Progress Notes (Signed)
Patient Care Team: Charlynn Court, NP as PCP - General (Nurse Practitioner) Rockwell Germany, RN as Oncology Nurse Navigator Mauro Kaufmann, RN as Oncology Nurse Navigator  DIAGNOSIS:  Encounter Diagnosis  Name Primary?  . Malignant neoplasm of lower-outer quadrant of left breast of female, estrogen receptor positive ([place])     SUMMARY OF ONCOLOGIC HISTORY: Oncology History  Malignant neoplasm of lower-outer quadrant of left breast of female, estrogen receptor positive ([place])  06/23/2020 Initial Diagnosis   Normal mammogram September 2021.  December 2021: Following trauma to the breast she felt a lump but for variety of reasons work-up was postponed.  Mammogram revealed 2 tumors measuring 7 cm individually 4 cm and 3 cm, 3 axillary lymph nodes, biopsy revealed grade 3 IDC ER 10%, PR 0%, Ki-67 40%, HER-2 +3+ by IHC, lymph node also positive   06/28/2020 Cancer Staging   Staging form: Breast, AJCC 8th Edition - Clinical stage from 06/28/2020: Stage IIB (cT2, cN1, cM0, G3, ER+, PR-, HER2+) - Signed by Nicholas Lose, MD on 06/28/2020 Stage prefix: Initial diagnosis Histologic grading system: 3 grade system   07/13/2020 -  Chemotherapy    Patient is on Treatment Plan: BREAST  DOCETAXEL + CARBOPLATIN + TRASTUZUMAB + PERTUZUMAB  (TCHP) Q21D         CHIEF COMPLIANT: Cycle 2 TCH Perjeta  INTERVAL HISTORY: HALO SHEVLIN is a 55 year old above-mentioned history of HER2 positive breast cancer who is currently on neoadjuvant chemotherapy today cycle 2 of [place] Perjeta.  She has tolerated chemo fairly well.  She did have some nausea and intermittent occasional loose stools.  She however has been doing well with regards to her energy levels.  Her taste and appetite have improved.  Did not have any neuropathy symptoms.   ALLERGIES:  has No Known Allergies.  MEDICATIONS:  Current Outpatient Medications  Medication Sig Dispense Refill  . dexamethasone (DECADRON) 4 MG tablet Take 1 tablet (4 mg  total) by mouth daily. Take 1 tablet day before chemo and 1 tablet day after chemo with food 12 tablet 0  . lidocaine-prilocaine (EMLA) cream Apply to affected area once 30 g 3  . ondansetron (ZOFRAN) 8 MG tablet Take 1 tablet (8 mg total) by mouth 2 (two) times daily as needed (Nausea or vomiting). Start on the third day after chemotherapy. 30 tablet 1  . prochlorperazine (COMPAZINE) 10 MG tablet Take 1 tablet (10 mg total) by mouth every 6 (six) hours as needed (Nausea or vomiting). 30 tablet 1   No current facility-administered medications for this visit.    PHYSICAL EXAMINATION: ECOG PERFORMANCE STATUS: 1 - Symptomatic but completely ambulatory  Vitals:   08/03/20 0859  BP: 110/77  Pulse: 76  Resp: 16  Temp: (!) 97.2 F (36.2 C)  SpO2: 100%   Filed Weights   08/03/20 0859  Weight: 151 lb 3.2 oz (68.6 kg)       LABORATORY DATA:  I have reviewed the data as listed CMP Latest Ref Rng & Units 07/19/2020 07/13/2020  Glucose 70 - 99 mg/dL 121(H) 98  BUN 6 - 20 mg/dL 11 13  Creatinine 0.44 - 1.00 mg/dL 0.71 0.74  Sodium 135 - 145 mmol/L 138 143  Potassium 3.5 - 5.1 mmol/L 4.0 4.1  Chloride 98 - 111 mmol/L 101 107  CO2 22 - 32 mmol/L 29 27  Calcium 8.9 - 10.3 mg/dL 9.2 9.7  Total Protein 6.5 - 8.1 g/dL 7.1 7.3  Total Bilirubin 0.3 - 1.2 mg/dL 0.5  0.4  Alkaline Phos 38 - 126 U/L 94 66  AST 15 - 41 U/L 12(L) 11(L)  ALT 0 - 44 U/L 20 9    Lab Results  Component Value Date   WBC 14.5 (H) 08/03/2020   HGB 11.6 (L) 08/03/2020   HCT 35.0 (L) 08/03/2020   MCV 91.4 08/03/2020   PLT 292 08/03/2020   NEUTROABS 10.5 (H) 08/03/2020    ASSESSMENT & PLAN:  Malignant neoplasm of lower-outer quadrant of left breast of female, estrogen receptor positive ([place]) 06/23/2020:Normal mammogram September 2021. December 2021: Following trauma to the breast she felt a lump but for variety of reasons work-up was postponed. Mammogram revealed 2 tumors measuring 7 cm individually 4 cm and 3 cm, 3  axillary lymph nodes, biopsy revealed grade 3 IDC ER 10%, PR 0%, Ki-67 40%, HER-2 +3+ by IHC, lymph node also positive.  Treatment Plan: 1. Neoadjuvant chemotherapy with TCH Perjeta 6 cycles followed by Herceptin Perjeta maintenance versus Kadcyla maintenance (based on response to neoadjuvant chemo) for 1 year 2. Followed by breast conserving surgery if possible withaxillary lymph node dissection 3. Followed by adjuvant radiation therapy 4.Followed by antiestrogen therapy 5.Followed by neratinib MRI liver 07/11/20: Hepatic cysts Bone scan and CT CAP: No mets Breast MRI 06/28/2020: 2 large left breast masses each 0.5 cm, overall 7 cm, third contiguous mass 3 cm (could be a lymph node, abutting pectoralis muscle), 4 lymph nodes left axilla ---------------------------------------------------------------------------------------------------------------------------- Current Treatment:Cycle 2 TCHP ECHO: 07/11/20: EF 60-65% Labs reviewed  Chemo Toxicities: Denies any nausea or vomiting. 1. Had couple of episodes of loose stools but no diarrhea.  She took Imodium which solve the problem. 2. Fatigue day 4 today 6 Monitoring blood counts.  RTC in 3 weeks for cycle 3   No orders of the defined types were placed in this encounter.  The patient has a good understanding of the overall plan. she agrees with it. she will call with any problems that may develop before the next visit here. Total time spent: 30 mins including face to face time and time spent for planning, charting and co-ordination of care   Harriette Ohara, MD 08/03/20

## 2020-08-03 NOTE — Patient Instructions (Signed)
[facility]  Discharge Instructions: Thank you for choosing [facility] to provide your oncology and hematology care.   If you have a lab appointment with the Cancer Center, please go directly to the Cancer Center and check in at the registration area.   Wear comfortable clothing and clothing appropriate for easy access to any Portacath or PICC line.   We strive to give you quality time with your provider. You may need to reschedule your appointment if you arrive late (15 or more minutes).  Arriving late affects you and other patients whose appointments are after yours.  Also, if you miss three or more appointments without notifying the office, you may be dismissed from the clinic at the provider's discretion.      For prescription refill requests, have your pharmacy contact our office and allow 72 hours for refills to be completed.    Today you received the following chemotherapy and/or immunotherapy agents Herceptin, Perjeta, Taxotere, Carboplatin      To help prevent nausea and vomiting after your treatment, we encourage you to take your nausea medication as directed.  BELOW ARE SYMPTOMS THAT SHOULD BE REPORTED IMMEDIATELY: *FEVER GREATER THAN 100.4 F (38 C) OR HIGHER *CHILLS OR SWEATING *NAUSEA AND VOMITING THAT IS NOT CONTROLLED WITH YOUR NAUSEA MEDICATION *UNUSUAL SHORTNESS OF BREATH *UNUSUAL BRUISING OR BLEEDING *URINARY PROBLEMS (pain or burning when urinating, or frequent urination) *BOWEL PROBLEMS (unusual diarrhea, constipation, pain near the anus) TENDERNESS IN MOUTH AND THROAT WITH OR WITHOUT PRESENCE OF ULCERS (sore throat, sores in mouth, or a toothache) UNUSUAL RASH, SWELLING OR PAIN  UNUSUAL VAGINAL DISCHARGE OR ITCHING   Items with * indicate a potential emergency and should be followed up as soon as possible or go to the Emergency Department if any problems should occur.  Please show the CHEMOTHERAPY ALERT CARD or  IMMUNOTHERAPY ALERT CARD at check-in to the Emergency Department and triage nurse.  Should you have questions after your visit or need to cancel or reschedule your appointment, please contact [facility]  Dept: [phone]  and follow the prompts.  Office hours are 8:00 a.m. to 4:30 p.m. Monday - Friday. Please note that voicemails left after 4:00 p.m. may not be returned until the following business day.  We are closed weekends and major holidays. You have access to a nurse at all times for urgent questions. Please call the main number to the clinic Dept: [phone] and follow the prompts.   For any non-urgent questions, you may also contact your provider using MyChart. We now offer e-Visits for anyone 18 and older to request care online for non-urgent symptoms. For details visit [email or web address].   Also download the MyChart app! Go to the app store, search "MyChart", open the app, select [facility], and log in with your MyChart username and password.  Due to Covid, a mask is required upon entering the hospital/clinic. If you do not have a mask, one will be given to you upon arrival. For doctor visits, patients may have 1 support person aged 18 or older with them. For treatment visits, patients cannot have anyone with them due to current Covid guidelines and our immunocompromised population.   

## 2020-08-03 NOTE — Research (Signed)
DCP-001: Use of a Clinical Trial Screening Tool to Address Cancer Health Disparities in the [place] [facility] ([place])  08/03/2020     10:45AM  CONSENT: This Clinical Research Coordinator met with Janet [name], [name] on 08/03/20 in a manner and location that ensures patient privacy to discuss participation in the above listed research study.  Patient is Unaccompanied.  Patient was previously provided with informed consent documents.  Patient has not yet read the informed consent documents and so documents were reviewed page by page today.  As outlined in the informed consent form, this Coordinator and SHANECIA HOGANSON discussed the purpose of the research study, the investigational nature of the study, study procedures and requirements for study participation, potential risks and benefits of study participation, as well as alternatives to participation. The patient understands participation is voluntary and they may withdraw from study participation at any time.    Confidentiality and how the patient's information will be used as part of study participation were discussed.    All questions were answered to patient's satisfaction.  The informed consent and separate HIPAA Authorization was reviewed page by page.  The patient's mental and emotional status is appropriate to provide informed consent, and the patient verbalizes an understanding of study participation.  Patient has agreed to participate in the above listed research study and has voluntarily signed the informed consent version dated 09/09/2018 and separate HIPAA Authorization, version 5 on 08/03/20 at 1054 AM.  The patient was provided with a copy of the signed informed consent form and separate HIPAA Authorization for their reference.  No study specific procedures were obtained prior to the signing of the informed consent document.  Approximately 10 minutes were spent with the patient reviewing the informed consent  documents.    Upon obtaining informed consent, patient was provided an opportunity to complete the DCP-001 Worksheet. This clinical research coordinator verified completeness and patient was thanked for her time and participation in the above mentioned study.   Carol Ada, RT(R)(T) Clinical Research Coordinator

## 2020-08-05 ENCOUNTER — Inpatient Hospital Stay: Payer: 59

## 2020-08-05 ENCOUNTER — Other Ambulatory Visit: Payer: Self-pay

## 2020-08-05 VITALS — BP 107/80 | HR 74 | Temp 98.3°F | Resp 16

## 2020-08-05 DIAGNOSIS — C50512 Malignant neoplasm of lower-outer quadrant of left female breast: Secondary | ICD-10-CM

## 2020-08-05 DIAGNOSIS — Z5111 Encounter for antineoplastic chemotherapy: Secondary | ICD-10-CM | POA: Diagnosis not present

## 2020-08-05 MED ORDER — PEGFILGRASTIM-BMEZ 6 MG/0.6ML ~~LOC~~ SOSY
6.0000 mg | PREFILLED_SYRINGE | Freq: Once | SUBCUTANEOUS | Status: AC
  Administered 2020-08-05: 6 mg via SUBCUTANEOUS

## 2020-08-05 MED ORDER — PEGFILGRASTIM-BMEZ 6 MG/0.6ML ~~LOC~~ SOSY
PREFILLED_SYRINGE | SUBCUTANEOUS | Status: AC
  Filled 2020-08-05: qty 0.6

## 2020-08-05 NOTE — Patient Instructions (Signed)
Pegfilgrastim injection What is this medicine? PEGFILGRASTIM (PEG fil gra stim) is a long-acting granulocyte colony-stimulating factor that stimulates the growth of neutrophils, a type of white blood cell important in the body's fight against infection. It is used to reduce the incidence of fever and infection in patients with certain types of cancer who are receiving chemotherapy that affects the bone marrow, and to increase survival after being exposed to high doses of radiation. This medicine may be used for other purposes; ask your health care provider or pharmacist if you have questions. COMMON BRAND NAME(S): Fulphila, Neulasta, Nyvepria, UDENYCA, Ziextenzo What should I tell my health care provider before I take this medicine? They need to know if you have any of these conditions:  kidney disease  latex allergy  ongoing radiation therapy  sickle cell disease  skin reactions to acrylic adhesives (On-Body Injector only)  an unusual or allergic reaction to pegfilgrastim, filgrastim, other medicines, foods, dyes, or preservatives  pregnant or trying to get pregnant  breast-feeding How should I use this medicine? This medicine is for injection under the skin. If you get this medicine at home, you will be taught how to prepare and give the pre-filled syringe or how to use the On-body Injector. Refer to the patient Instructions for Use for detailed instructions. Use exactly as directed. Tell your healthcare provider immediately if you suspect that the On-body Injector may not have performed as intended or if you suspect the use of the On-body Injector resulted in a missed or partial dose. It is important that you put your used needles and syringes in a special sharps container. Do not put them in a trash can. If you do not have a sharps container, call your pharmacist or healthcare provider to get one. Talk to your pediatrician regarding the use of this medicine in children. While this drug  may be prescribed for selected conditions, precautions do apply. Overdosage: If you think you have taken too much of this medicine contact a poison control center or emergency room at once. NOTE: This medicine is only for you. Do not share this medicine with others. What if I miss a dose? It is important not to miss your dose. Call your doctor or health care professional if you miss your dose. If you miss a dose due to an On-body Injector failure or leakage, a new dose should be administered as soon as possible using a single prefilled syringe for manual use. What may interact with this medicine? Interactions have not been studied. This list may not describe all possible interactions. Give your health care provider a list of all the medicines, herbs, non-prescription drugs, or dietary supplements you use. Also tell them if you smoke, drink alcohol, or use illegal drugs. Some items may interact with your medicine. What should I watch for while using this medicine? Your condition will be monitored carefully while you are receiving this medicine. You may need blood work done while you are taking this medicine. Talk to your health care provider about your risk of cancer. You may be more at risk for certain types of cancer if you take this medicine. If you are going to need a MRI, CT scan, or other procedure, tell your doctor that you are using this medicine (On-Body Injector only). What side effects may I notice from receiving this medicine? Side effects that you should report to your doctor or health care professional as soon as possible:  allergic reactions (skin rash, itching or hives, swelling of   the face, lips, or tongue)  back pain  dizziness  fever  pain, redness, or irritation at site where injected  pinpoint red spots on the skin  red or dark-brown urine  shortness of breath or breathing problems  stomach or side pain, or pain at the shoulder  swelling  tiredness  trouble  passing urine or change in the amount of urine  unusual bruising or bleeding Side effects that usually do not require medical attention (report to your doctor or health care professional if they continue or are bothersome):  bone pain  muscle pain This list may not describe all possible side effects. Call your doctor for medical advice about side effects. You may report side effects to FDA at 1-800-FDA-1088. Where should I keep my medicine? Keep out of the reach of children. If you are using this medicine at home, you will be instructed on how to store it. Throw away any unused medicine after the expiration date on the label. NOTE: This sheet is a summary. It may not cover all possible information. If you have questions about this medicine, talk to your doctor, pharmacist, or health care provider.  2021 Elsevier/Gold Standard (2019-03-27 13:20:51)  

## 2020-08-10 ENCOUNTER — Other Ambulatory Visit: Payer: Self-pay | Admitting: *Deleted

## 2020-08-10 ENCOUNTER — Encounter: Payer: Self-pay | Admitting: *Deleted

## 2020-08-10 MED ORDER — AZITHROMYCIN 250 MG PO TABS: ORAL_TABLET | ORAL | 0 refills | Status: DC

## 2020-08-10 MED ORDER — FLUCONAZOLE 200 MG PO TABS
200.0000 mg | ORAL_TABLET | Freq: Every day | ORAL | 0 refills | Status: DC
Start: 2020-08-10 — End: 2020-11-09

## 2020-08-10 MED ORDER — NYSTATIN 100000 UNIT/ML MT SUSP: 5.0000 mL | Freq: Four times a day (QID) | OROMUCOSAL | 1 refills | Status: DC

## 2020-08-10 NOTE — Progress Notes (Signed)
Received call from pt with complaint of productive yellow sputum cough, post nasal drainage, sore throat, and white patches on her tongue x4 days.  Pt denies fever or recent sick contact and states Covid test is pending. RN will review with MD for recommendations and treatment.

## 2020-08-10 NOTE — Progress Notes (Signed)
Per MD pt  to be prescribed Z- Pak, Diflucan 200 mg p.o daily x1, and Nystatin oral suspension.  Prescription sent to pharmacy on file. Pt educated and verbalized understanding.

## 2020-08-23 NOTE — Progress Notes (Signed)
Patient Care Team: Charlynn Court, NP as PCP - General (Nurse Practitioner) Rockwell Germany, RN as Oncology Nurse Navigator Mauro Kaufmann, RN as Oncology Nurse Navigator  DIAGNOSIS:    ICD-10-CM   1. Malignant neoplasm of lower-outer quadrant of left breast of female, estrogen receptor positive ([place])  C50.512    Z17.0     SUMMARY OF ONCOLOGIC HISTORY: Oncology History  Malignant neoplasm of lower-outer quadrant of left breast of female, estrogen receptor positive ([place])  06/23/2020 Initial Diagnosis   Normal mammogram September 2021.  December 2021: Following trauma to the breast she felt a lump but for variety of reasons work-up was postponed.  Mammogram revealed 2 tumors measuring 7 cm individually 4 cm and 3 cm, 3 axillary lymph nodes, biopsy revealed grade 3 IDC ER 10%, PR 0%, Ki-67 40%, HER-2 +3+ by IHC, lymph node also positive   06/28/2020 Cancer Staging   Staging form: Breast, AJCC 8th Edition - Clinical stage from 06/28/2020: Stage IIB (cT2, cN1, cM0, G3, ER+, PR-, HER2+) - Signed by Nicholas Lose, MD on 06/28/2020 Stage prefix: Initial diagnosis Histologic grading system: 3 grade system   07/13/2020 -  Chemotherapy    Patient is on Treatment Plan: BREAST  DOCETAXEL + CARBOPLATIN + TRASTUZUMAB + PERTUZUMAB  (TCHP) Q21D         CHIEF COMPLIANT: Cycle 3 TCH Perjeta  INTERVAL HISTORY: Janet [name] is a 55 y.o. with above-mentioned history of HER2 positive breast cancer who is currently on neoadjuvant chemotherapy with [place]. She presents to the clinic today for cycle 3.    She had diarrhea 3-4 times a day several days after the last cycle of chemo.  She was able to take Imodium and control it.  She does have some erythema around her anal area for which she is using topical creams.  She had a upper respiratory infection and took antibiotics and then developed thrush for which we had to give her nystatin.  That seemed to have helped and resolved her thrush.  ALLERGIES:   has No Known Allergies.  MEDICATIONS:  Current Outpatient Medications  Medication Sig Dispense Refill  . azithromycin (ZITHROMAX) 250 MG tablet Take as directed 6 each 0  . dexamethasone (DECADRON) 4 MG tablet Take 1 tablet (4 mg total) by mouth daily. Take 1 tablet day before chemo and 1 tablet day after chemo with food 12 tablet 0  . fluconazole (DIFLUCAN) 200 MG tablet Take 1 tablet (200 mg total) by mouth daily. 1 tablet 0  . lidocaine-prilocaine (EMLA) cream Apply to affected area once 30 g 3  . nystatin (MYCOSTATIN) 100000 UNIT/ML suspension Take 5 mLs (500,000 Units total) by mouth 4 (four) times daily. 60 mL 1  . ondansetron (ZOFRAN) 8 MG tablet Take 1 tablet (8 mg total) by mouth 2 (two) times daily as needed (Nausea or vomiting). Start on the third day after chemotherapy. 30 tablet 1  . prochlorperazine (COMPAZINE) 10 MG tablet Take 1 tablet (10 mg total) by mouth every 6 (six) hours as needed (Nausea or vomiting). 30 tablet 1   No current facility-administered medications for this visit.    PHYSICAL EXAMINATION: ECOG PERFORMANCE STATUS: 1 - Symptomatic but completely ambulatory  There were no vitals filed for this visit. There were no vitals filed for this visit.  LABORATORY DATA:  I have reviewed the data as listed CMP Latest Ref Rng & Units 08/03/2020 07/19/2020 07/13/2020  Glucose 70 - 99 mg/dL 88 121(H) 98  BUN  6 - 20 mg/dL _0 Creatinine 0.44 - 1.00 mg/dL 0.62 0.71 0.74  Sodium 135 - 145 mmol/L 143 138 143  Potassium 3.5 - 5.1 mmol/L 3.6 4.0 4.1  Chloride 98 - 111 mmol/L 107 101 107  CO2 22 - 32 mmol/L _1 Calcium 8.9 - 10.3 mg/dL 9.3 9.2 9.7  Total Protein 6.5 - 8.1 g/dL 6.8 7.1 7.3  Total Bilirubin 0.3 - 1.2 mg/dL 0.2(L) 0.5 0.4  Alkaline Phos 38 - 126 U/L 96 94 66  AST 15 - 41 U/L 10(L) 12(L) 11(L)  ALT 0 - 44 U/L _2 Lab Results  Component Value Date   WBC 9.8 08/24/2020   HGB 11.8 (L) 08/24/2020   HCT 34.9 (L) 08/24/2020   MCV 90.4  08/24/2020   PLT 206 08/24/2020   NEUTROABS 6.7 08/24/2020    ASSESSMENT & PLAN:  Malignant neoplasm of lower-outer quadrant of left breast of female, estrogen receptor positive ([place]) 06/23/2020:Normal mammogram September 2021. December 2021: Following trauma to the breast she felt a lump but for variety of reasons work-up was postponed. 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  Treatment Plan: 1. Neoadjuvant chemotherapy with TCH Perjeta 6 cycles followed by Herceptin Perjeta maintenance versus Kadcyla maintenance (based on response to neoadjuvant chemo) for 1 year 2. Followed by breast conserving surgery if possible withaxillary lymph node dissection 3. Followed by adjuvant radiation therapy 4.Followed by antiestrogen therapy 5.Followed by neratinib MRI liver 07/11/20: Hepatic cysts Bone scan and CT CAP: No mets Breast MRI 06/28/2020: 2 large left breast masses each 0.5 cm, overall 7 cm, third contiguous mass 3 cm (could be a lymph node, abutting pectoralis muscle), 4 lymph nodes left axilla ---------------------------------------------------------------------------------------------------------------------------- Current Treatment:Cycle 3TCHP ECHO: 07/11/20: EF 60-65% Labs reviewed  Chemo Toxicities: Denies any nausea or vomiting. 1.   Diarrhea: Occasionally 3-4 loose stools per day. She took Imodium which solve the problem. 2. Fatigue day 4 today 6 3.  Alopecia 4.  Chemotherapy induced anemia: Monitoring hemoglobin 11.8 5.  Thrush: Resolved Monitoring blood counts.  Along with the next treatment we will have genetic counseling appointment. RTC in3 weeks for cycle 4    No orders of the defined types were placed in this encounter.  The patient has a good understanding of the overall plan. she agrees with it. she  will call with any problems that may develop before the next visit here.  Total time spent: 30 mins including face to face time and time spent for planning, charting and coordination of care  Rulon Eisenmenger, MD, MPH 08/24/2020  I, Cloyde Reams Dorshimer, am acting as scribe for Dr. Nicholas Lose.  I have reviewed the above documentation for accuracy and completeness, and I agree with the above.

## 2020-08-23 NOTE — Assessment & Plan Note (Signed)
06/23/2020:Normal mammogram September 2021. December 2021: Following trauma to the breast she felt a lump but for variety of reasons work-up was postponed. 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  Treatment Plan: 1. Neoadjuvant chemotherapy with TCH Perjeta 6 cycles followed by Herceptin Perjeta maintenance versus Kadcyla maintenance (based on response to neoadjuvant chemo) for 1 year 2. Followed by breast conserving surgery if possible withaxillary lymph node dissection 3. Followed by adjuvant radiation therapy 4.Followed by antiestrogen therapy 5.Followed by neratinib MRI liver 07/11/20: Hepatic cysts Bone scan and CT CAP: No mets Breast MRI 06/28/2020: 2 large left breast masses each 0.5 cm, overall 7 cm, third contiguous mass 3 cm (could be a lymph node, abutting pectoralis muscle), 4 lymph nodes left axilla ---------------------------------------------------------------------------------------------------------------------------- Current Treatment:Cycle 3TCHP ECHO: 07/11/20: EF 60-65% Labs reviewed  Chemo Toxicities: Denies any nausea or vomiting. 1. Had couple of episodes of loose stools but no diarrhea. She took Imodium which solve the problem. 2. Fatigue day 4 today 6 Monitoring blood counts.  RTC in3 weeks for cycle 4

## 2020-08-24 ENCOUNTER — Other Ambulatory Visit: Payer: Self-pay

## 2020-08-24 ENCOUNTER — Inpatient Hospital Stay (HOSPITAL_BASED_OUTPATIENT_CLINIC_OR_DEPARTMENT_OTHER): Payer: 59 | Admitting: Hematology and Oncology

## 2020-08-24 ENCOUNTER — Other Ambulatory Visit: Payer: 59

## 2020-08-24 ENCOUNTER — Inpatient Hospital Stay: Payer: 59

## 2020-08-24 ENCOUNTER — Inpatient Hospital Stay: Payer: 59 | Attending: Hematology and Oncology

## 2020-08-24 VITALS — BP 113/80 | HR 72 | Resp 17

## 2020-08-24 DIAGNOSIS — C50512 Malignant neoplasm of lower-outer quadrant of left female breast: Secondary | ICD-10-CM | POA: Insufficient documentation

## 2020-08-24 DIAGNOSIS — Z170 Estrogen receptor positive status [ER+]: Secondary | ICD-10-CM

## 2020-08-24 DIAGNOSIS — Z5111 Encounter for antineoplastic chemotherapy: Secondary | ICD-10-CM | POA: Diagnosis present

## 2020-08-24 DIAGNOSIS — Z5189 Encounter for other specified aftercare: Secondary | ICD-10-CM | POA: Insufficient documentation

## 2020-08-24 DIAGNOSIS — Z5112 Encounter for antineoplastic immunotherapy: Secondary | ICD-10-CM | POA: Diagnosis present

## 2020-08-24 LAB — CMP (CANCER CENTER ONLY)
ALT: 10 U/L (ref 0–44)
AST: 11 U/L — ABNORMAL LOW (ref 15–41)
Albumin: 3.7 g/dL (ref 3.5–5.0)
Alkaline Phosphatase: 99 U/L (ref 38–126)
Anion gap: 9 (ref 5–15)
BUN: 13 mg/dL (ref 6–20)
CO2: 27 mmol/L (ref 22–32)
Calcium: 9.4 mg/dL (ref 8.9–10.3)
Chloride: 106 mmol/L (ref 98–111)
Creatinine: 0.67 mg/dL (ref 0.44–1.00)
GFR, Estimated: >60 mL/min (ref >60)
Glucose, Bld: 91 mg/dL (ref 70–99)
Potassium: 3.6 mmol/L (ref 3.5–5.1)
Sodium: 142 mmol/L (ref 135–145)
Total Bilirubin: 0.3 mg/dL (ref 0.3–1.2)
Total Protein: 7 g/dL (ref 6.5–8.1)

## 2020-08-24 LAB — CBC WITH DIFFERENTIAL (CANCER CENTER ONLY)
Abs Immature Granulocytes: 0.04 10*3/uL (ref 0.00–0.07)
Basophils Absolute: 0.1 10*3/uL (ref 0.0–0.1)
Basophils Relative: 1 %
Eosinophils Absolute: 0 10*3/uL (ref 0.0–0.5)
Eosinophils Relative: 0 %
HCT: 34.9 % — ABNORMAL LOW (ref 36.0–46.0)
Hemoglobin: 11.8 g/dL — ABNORMAL LOW (ref 12.0–15.0)
Immature Granulocytes: 0 %
Lymphocytes Relative: 21 %
Lymphs Abs: 2.1 10*3/uL (ref 0.7–4.0)
MCH: 30.6 pg (ref 26.0–34.0)
MCHC: 33.8 g/dL (ref 30.0–36.0)
MCV: 90.4 fL (ref 80.0–100.0)
Monocytes Absolute: 0.9 10*3/uL (ref 0.1–1.0)
Monocytes Relative: 9 %
Neutro Abs: 6.7 10*3/uL (ref 1.7–7.7)
Neutrophils Relative %: 69 %
Platelet Count: 206 10*3/uL (ref 150–400)
RBC: 3.86 MIL/uL — ABNORMAL LOW (ref 3.87–5.11)
RDW: 14.9 % (ref 11.5–15.5)
WBC Count: 9.8 10*3/uL (ref 4.0–10.5)
nRBC: 0 % (ref 0.0–0.2)

## 2020-08-24 MED ORDER — ACETAMINOPHEN 325 MG PO TABS
ORAL_TABLET | ORAL | Status: AC
  Filled 2020-08-24: qty 2

## 2020-08-24 MED ORDER — PALONOSETRON HCL INJECTION 0.25 MG/5ML
INTRAVENOUS | Status: AC
  Filled 2020-08-24: qty 5

## 2020-08-24 MED ORDER — TRASTUZUMAB-ANNS CHEMO 150 MG IV SOLR
6.0000 mg/kg | Freq: Once | INTRAVENOUS | Status: AC
  Administered 2020-08-24: 420 mg via INTRAVENOUS
  Filled 2020-08-24: qty 20

## 2020-08-24 MED ORDER — SODIUM CHLORIDE 0.9 % IV SOLN
420.0000 mg | Freq: Once | INTRAVENOUS | Status: AC
  Administered 2020-08-24: 420 mg via INTRAVENOUS
  Filled 2020-08-24: qty 14

## 2020-08-24 MED ORDER — SODIUM CHLORIDE 0.9 % IV SOLN
10.0000 mg | Freq: Once | INTRAVENOUS | Status: AC
  Administered 2020-08-24: 10 mg via INTRAVENOUS
  Filled 2020-08-24: qty 1
  Filled 2020-08-24: qty 10

## 2020-08-24 MED ORDER — DIPHENHYDRAMINE HCL 25 MG PO CAPS
50.0000 mg | ORAL_CAPSULE | Freq: Once | ORAL | Status: AC
  Administered 2020-08-24: 50 mg via ORAL

## 2020-08-24 MED ORDER — ACETAMINOPHEN 325 MG PO TABS
650.0000 mg | ORAL_TABLET | Freq: Once | ORAL | Status: AC
  Administered 2020-08-24: 650 mg via ORAL

## 2020-08-24 MED ORDER — SODIUM CHLORIDE 0.9 % IV SOLN
700.0000 mg | Freq: Once | INTRAVENOUS | Status: AC
  Administered 2020-08-24: 700 mg via INTRAVENOUS
  Filled 2020-08-24: qty 70

## 2020-08-24 MED ORDER — SODIUM CHLORIDE 0.9% FLUSH
10.0000 mL | INTRAVENOUS | Status: DC | PRN
  Administered 2020-08-24: 10 mL
  Filled 2020-08-24: qty 10

## 2020-08-24 MED ORDER — HEPARIN SOD (PORK) LOCK FLUSH 100 UNIT/ML IV SOLN
500.0000 [IU] | Freq: Once | INTRAVENOUS | Status: AC | PRN
  Administered 2020-08-24: 500 [IU]
  Filled 2020-08-24: qty 5

## 2020-08-24 MED ORDER — SODIUM CHLORIDE 0.9 % IV SOLN
75.0000 mg/m2 | Freq: Once | INTRAVENOUS | Status: AC
  Administered 2020-08-24: 140 mg via INTRAVENOUS
  Filled 2020-08-24: qty 14

## 2020-08-24 MED ORDER — SODIUM CHLORIDE 0.9 % IV SOLN
Freq: Once | INTRAVENOUS | Status: AC
  Filled 2020-08-24: qty 250

## 2020-08-24 MED ORDER — PALONOSETRON HCL INJECTION 0.25 MG/5ML
0.2500 mg | Freq: Once | INTRAVENOUS | Status: AC
  Administered 2020-08-24: 0.25 mg via INTRAVENOUS

## 2020-08-24 MED ORDER — DIPHENHYDRAMINE HCL 25 MG PO CAPS
ORAL_CAPSULE | ORAL | Status: AC
  Filled 2020-08-24: qty 2

## 2020-08-24 MED ORDER — SODIUM CHLORIDE 0.9 % IV SOLN
150.0000 mg | Freq: Once | INTRAVENOUS | Status: AC
  Administered 2020-08-24: 150 mg via INTRAVENOUS
  Filled 2020-08-24: qty 150

## 2020-08-24 NOTE — Patient Instructions (Signed)
[facility]  Discharge Instructions: Thank you for choosing [facility] to provide your oncology and hematology care.   If you have a lab appointment with the [facility], please go directly to the [facility] and check in at the registration area.   Wear comfortable clothing and clothing appropriate for easy access to any Portacath or PICC line.   We strive to give you quality time with your provider. You may need to reschedule your appointment if you arrive late (15 or more minutes).  Arriving late affects you and other patients whose appointments are after yours.  Also, if you miss three or more appointments without notifying the office, you may be dismissed from the clinic at the provider's discretion.      For prescription refill requests, have your pharmacy contact our office and allow 72 hours for refills to be completed.    Today you received the following chemotherapy and/or immunotherapy agents Kanjinti, Perjeta, Taxotere, Carboplatin    To help prevent nausea and vomiting after your treatment, we encourage you to take your nausea medication as directed.  BELOW ARE SYMPTOMS THAT SHOULD BE REPORTED IMMEDIATELY: . *FEVER GREATER THAN 100.4 F (38 C) OR HIGHER . *CHILLS OR SWEATING . *NAUSEA AND VOMITING THAT IS NOT CONTROLLED WITH YOUR NAUSEA MEDICATION . *UNUSUAL SHORTNESS OF BREATH . *UNUSUAL BRUISING OR BLEEDING . *URINARY PROBLEMS (pain or burning when urinating, or frequent urination) . *BOWEL PROBLEMS (unusual diarrhea, constipation, pain near the anus) . TENDERNESS IN MOUTH AND THROAT WITH OR WITHOUT PRESENCE OF ULCERS (sore throat, sores in mouth, or a toothache) . UNUSUAL RASH, SWELLING OR PAIN  . UNUSUAL VAGINAL DISCHARGE OR ITCHING   Items with * indicate a potential emergency and should be followed up as soon as possible or go to the Emergency Department if any problems should occur.  Please show the CHEMOTHERAPY ALERT  CARD or IMMUNOTHERAPY ALERT CARD at check-in to the Emergency Department and triage nurse.  Should you have questions after your visit or need to cancel or reschedule your appointment, please contact [facility]  Dept: [phone]  and follow the prompts.  Office hours are 8:00 a.m. to 4:30 p.m. Monday - Friday. Please note that voicemails left after 4:00 p.m. may not be returned until the following business day.  We are closed weekends and major holidays. You have access to a nurse at all times for urgent questions. Please call the main number to the clinic Dept: [phone] and follow the prompts.   For any non-urgent questions, you may also contact your provider using MyChart. We now offer e-Visits for anyone 54 and older to request care online for non-urgent symptoms. For details visit [email or web address].   Also download the MyChart app! Go to the app store, search "MyChart", open the app, select , and log in with your MyChart username and password.  Due to Covid, a mask is required upon entering the hospital/clinic. If you do not have a mask, one will be given to you upon arrival. For doctor visits, patients may have 1 support person aged 43 or older with them. For treatment visits, patients cannot have anyone with them due to current Covid guidelines and our immunocompromised population.   Trastuzumab injection for infusion What is this medicine? TRASTUZUMAB (tras TOO zoo mab) is a monoclonal antibody. It is used to treat breast cancer and stomach cancer. This medicine may be used for other purposes; ask your health  care provider or pharmacist if you have questions. COMMON BRAND NAME(S): Herceptin, Galvin Proffer, Trazimera What should I tell my health care provider before I take this medicine? They need to know if you have any of these conditions:  heart disease  heart failure  lung or breathing disease, like  asthma  an unusual or allergic reaction to trastuzumab, benzyl alcohol, or other medications, foods, dyes, or preservatives  pregnant or trying to get pregnant  breast-feeding How should I use this medicine? This drug is given as an infusion into a vein. It is administered in a hospital or clinic by a specially trained health care professional. Talk to your pediatrician regarding the use of this medicine in children. This medicine is not approved for use in children. Overdosage: If you think you have taken too much of this medicine contact a poison control center or emergency room at once. NOTE: This medicine is only for you. Do not share this medicine with others. What if I miss a dose? It is important not to miss a dose. Call your doctor or health care professional if you are unable to keep an appointment. What may interact with this medicine? This medicine may interact with the following medications:  certain types of chemotherapy, such as daunorubicin, doxorubicin, epirubicin, and idarubicin This list may not describe all possible interactions. Give your health care provider a list of all the medicines, herbs, non-prescription drugs, or dietary supplements you use. Also tell them if you smoke, drink alcohol, or use illegal drugs. Some items may interact with your medicine. What should I watch for while using this medicine? Visit your doctor for checks on your progress. Report any side effects. Continue your course of treatment even though you feel ill unless your doctor tells you to stop. Call your doctor or health care professional for advice if you get a fever, chills or sore throat, or other symptoms of a cold or flu. Do not treat yourself. Try to avoid being around people who are sick. You may experience fever, chills and shaking during your first infusion. These effects are usually mild and can be treated with other medicines. Report any side effects during the infusion to your health  care professional. Fever and chills usually do not happen with later infusions. Do not become pregnant while taking this medicine or for 7 months after stopping it. Women should inform their doctor if they wish to become pregnant or think they might be pregnant. Women of child-bearing potential will need to have a negative pregnancy test before starting this medicine. There is a potential for serious side effects to an unborn child. Talk to your health care professional or pharmacist for more information. Do not breast-feed an infant while taking this medicine or for 7 months after stopping it. Women must use effective birth control with this medicine. What side effects may I notice from receiving this medicine? Side effects that you should report to your doctor or health care professional as soon as possible:  allergic reactions like skin rash, itching or hives, swelling of the face, lips, or tongue  chest pain or palpitations  cough  dizziness  feeling faint or lightheaded, falls  fever  general ill feeling or flu-like symptoms  signs of worsening heart failure like breathing problems; swelling in your legs and feet  unusually weak or tired Side effects that usually do not require medical attention (report to your doctor or health care professional if they continue or are bothersome):  bone pain  changes in taste  diarrhea  joint pain  nausea/vomiting  weight loss This list may not describe all possible side effects. Call your doctor for medical advice about side effects. You may report side effects to FDA at 1-800-FDA-1088. Where should I keep my medicine? This drug is given in a hospital or clinic and will not be stored at home. NOTE: This sheet is a summary. It may not cover all possible information. If you have questions about this medicine, talk to your doctor, pharmacist, or health care provider.  2021 Elsevier/Gold Standard (2016-02-28 14:37:52)  Pertuzumab  injection What is this medicine? PERTUZUMAB (per TOOZ ue mab) is a monoclonal antibody. It is used to treat breast cancer. This medicine may be used for other purposes; ask your health care provider or pharmacist if you have questions. COMMON BRAND NAME(S): PERJETA What should I tell my health care provider before I take this medicine? They need to know if you have any of these conditions:  heart disease  heart failure  high blood pressure  history of irregular heart beat  recent or ongoing radiation therapy  an unusual or allergic reaction to pertuzumab, other medicines, foods, dyes, or preservatives  pregnant or trying to get pregnant  breast-feeding How should I use this medicine? This medicine is for infusion into a vein. It is given by a health care professional in a hospital or clinic setting. Talk to your pediatrician regarding the use of this medicine in children. Special care may be needed. Overdosage: If you think you have taken too much of this medicine contact a poison control center or emergency room at once. NOTE: This medicine is only for you. Do not share this medicine with others. What if I miss a dose? It is important not to miss your dose. Call your doctor or health care professional if you are unable to keep an appointment. What may interact with this medicine? Interactions are not expected. Give your health care provider a list of all the medicines, herbs, non-prescription drugs, or dietary supplements you use. Also tell them if you smoke, drink alcohol, or use illegal drugs. Some items may interact with your medicine. This list may not describe all possible interactions. Give your health care provider a list of all the medicines, herbs, non-prescription drugs, or dietary supplements you use. Also tell them if you smoke, drink alcohol, or use illegal drugs. Some items may interact with your medicine. What should I watch for while using this medicine? Your  condition will be monitored carefully while you are receiving this medicine. Report any side effects. Continue your course of treatment even though you feel ill unless your doctor tells you to stop. Do not become pregnant while taking this medicine or for 7 months after stopping it. Women should inform their doctor if they wish to become pregnant or think they might be pregnant. Women of child-bearing potential will need to have a negative pregnancy test before starting this medicine. There is a potential for serious side effects to an unborn child. Talk to your health care professional or pharmacist for more information. Do not breast-feed an infant while taking this medicine or for 7 months after stopping it. Women must use effective birth control with this medicine. Call your doctor or health care professional for advice if you get a fever, chills or sore throat, or other symptoms of a cold or flu. Do not treat yourself. Try to avoid being around people who are sick. You may  experience fever, chills, and headache during the infusion. Report any side effects during the infusion to your health care professional. What side effects may I notice from receiving this medicine? Side effects that you should report to your doctor or health care professional as soon as possible:  breathing problems  chest pain or palpitations  dizziness  feeling faint or lightheaded  fever or chills  skin rash, itching or hives  sore throat  swelling of the face, lips, or tongue  swelling of the legs or ankles  unusually weak or tired Side effects that usually do not require medical attention (report to your doctor or health care professional if they continue or are bothersome):  diarrhea  hair loss  nausea, vomiting  tiredness This list may not describe all possible side effects. Call your doctor for medical advice about side effects. You may report side effects to FDA at 1-800-FDA-1088. Where should I  keep my medicine? This drug is given in a hospital or clinic and will not be stored at home. NOTE: This sheet is a summary. It may not cover all possible information. If you have questions about this medicine, talk to your doctor, pharmacist, or health care provider.  2021 Elsevier/Gold Standard (2015-04-07 12:08:50)   Pertuzumab injection What is this medicine? PERTUZUMAB (per TOOZ ue mab) is a monoclonal antibody. It is used to treat breast cancer. This medicine may be used for other purposes; ask your health care provider or pharmacist if you have questions. COMMON BRAND NAME(S): PERJETA What should I tell my health care provider before I take this medicine? They need to know if you have any of these conditions: heart disease heart failure high blood pressure history of irregular heart beat recent or ongoing radiation therapy an unusual or allergic reaction to pertuzumab, other medicines, foods, dyes, or preservatives pregnant or trying to get pregnant breast-feeding How should I use this medicine? This medicine is for infusion into a vein. It is given by a health care professional in a hospital or clinic setting. Talk to your pediatrician regarding the use of this medicine in children. Special care may be needed. Overdosage: If you think you have taken too much of this medicine contact a poison control center or emergency room at once. NOTE: This medicine is only for you. Do not share this medicine with others. What if I miss a dose? It is important not to miss your dose. Call your doctor or health care professional if you are unable to keep an appointment. What may interact with this medicine? Interactions are not expected. Give your health care provider a list of all the medicines, herbs, non-prescription drugs, or dietary supplements you use. Also tell them if you smoke, drink alcohol, or use illegal drugs. Some items may interact with your medicine. This list may not describe  all possible interactions. Give your health care provider a list of all the medicines, herbs, non-prescription drugs, or dietary supplements you use. Also tell them if you smoke, drink alcohol, or use illegal drugs. Some items may interact with your medicine. What should I watch for while using this medicine? Your condition will be monitored carefully while you are receiving this medicine. Report any side effects. Continue your course of treatment even though you feel ill unless your doctor tells you to stop. Do not become pregnant while taking this medicine or for 7 months after stopping it. Women should inform their doctor if they wish to become pregnant or think they might be pregnant.  Women of child-bearing potential will need to have a negative pregnancy test before starting this medicine. There is a potential for serious side effects to an unborn child. Talk to your health care professional or pharmacist for more information. Do not breast-feed an infant while taking this medicine or for 7 months after stopping it. Women must use effective birth control with this medicine. Call your doctor or health care professional for advice if you get a fever, chills or sore throat, or other symptoms of a cold or flu. Do not treat yourself. Try to avoid being around people who are sick. You may experience fever, chills, and headache during the infusion. Report any side effects during the infusion to your health care professional. What side effects may I notice from receiving this medicine? Side effects that you should report to your doctor or health care professional as soon as possible: breathing problems chest pain or palpitations dizziness feeling faint or lightheaded fever or chills skin rash, itching or hives sore throat swelling of the face, lips, or tongue swelling of the legs or ankles unusually weak or tired Side effects that usually do not require medical attention (report to your doctor or  health care professional if they continue or are bothersome): diarrhea hair loss nausea, vomiting tiredness This list may not describe all possible side effects. Call your doctor for medical advice about side effects. You may report side effects to FDA at 1-800-FDA-1088. Where should I keep my medicine? This drug is given in a hospital or clinic and will not be stored at home. NOTE: This sheet is a summary. It may not cover all possible information. If you have questions about this medicine, talk to your doctor, pharmacist, or health care provider.  2021 Elsevier/Gold Standard (2015-04-07 12:08:50)  Docetaxel injection What is this medicine? DOCETAXEL (doe se TAX el) is a chemotherapy drug. It targets fast dividing cells, like cancer cells, and causes these cells to die. This medicine is used to treat many types of cancers like breast cancer, certain stomach cancers, head and neck cancer, lung cancer, and prostate cancer. This medicine may be used for other purposes; ask your health care provider or pharmacist if you have questions. COMMON BRAND NAME(S): Docefrez, Taxotere What should I tell my health care provider before I take this medicine? They need to know if you have any of these conditions:  infection (especially a virus infection such as chickenpox, cold sores, or herpes)  liver disease  low blood counts, like low white cell, platelet, or red cell counts  an unusual or allergic reaction to docetaxel, polysorbate 80, other chemotherapy agents, other medicines, foods, dyes, or preservatives  pregnant or trying to get pregnant  breast-feeding How should I use this medicine? This drug is given as an infusion into a vein. It is administered in a hospital or clinic by a specially trained health care professional. Talk to your pediatrician regarding the use of this medicine in children. Special care may be needed. Overdosage: If you think you have taken too much of this medicine  contact a poison control center or emergency room at once. NOTE: This medicine is only for you. Do not share this medicine with others. What if I miss a dose? It is important not to miss your dose. Call your doctor or health care professional if you are unable to keep an appointment. What may interact with this medicine? Do not take this medicine with any of the following medications:  live virus vaccines This  medicine may also interact with the following medications:  aprepitant  certain antibiotics like erythromycin or clarithromycin  certain antivirals for HIV or hepatitis  certain medicines for fungal infections like fluconazole, itraconazole, ketoconazole, posaconazole, or voriconazole  cimetidine  ciprofloxacin  conivaptan  cyclosporine  dronedarone  fluvoxamine  grapefruit juice  imatinib  verapamil This list may not describe all possible interactions. Give your health care provider a list of all the medicines, herbs, non-prescription drugs, or dietary supplements you use. Also tell them if you smoke, drink alcohol, or use illegal drugs. Some items may interact with your medicine. What should I watch for while using this medicine? Your condition will be monitored carefully while you are receiving this medicine. You will need important blood work done while you are taking this medicine. Call your doctor or health care professional for advice if you get a fever, chills or sore throat, or other symptoms of a cold or flu. Do not treat yourself. This drug decreases your body's ability to fight infections. Try to avoid being around people who are sick. Some products may contain alcohol. Ask your health care professional if this medicine contains alcohol. Be sure to tell all health care professionals you are taking this medicine. Certain medicines, like metronidazole and disulfiram, can cause an unpleasant reaction when taken with alcohol. The reaction includes flushing,  headache, nausea, vomiting, sweating, and increased thirst. The reaction can last from 30 minutes to several hours. You may get drowsy or dizzy. Do not drive, use machinery, or do anything that needs mental alertness until you know how this medicine affects you. Do not stand or sit up quickly, especially if you are an older patient. This reduces the risk of dizzy or fainting spells. Alcohol may interfere with the effect of this medicine. Talk to your health care professional about your risk of cancer. You may be more at risk for certain types of cancer if you take this medicine. Do not become pregnant while taking this medicine or for 6 months after stopping it. Women should inform their doctor if they wish to become pregnant or think they might be pregnant. There is a potential for serious side effects to an unborn child. Talk to your health care professional or pharmacist for more information. Do not breast-feed an infant while taking this medicine or for 1 week after stopping it. Males who get this medicine must use a condom during sex with females who can get pregnant. If you get a woman pregnant, the baby could have birth defects. The baby could die before they are born. You will need to continue wearing a condom for 3 months after stopping the medicine. Tell your health care provider right away if your partner becomes pregnant while you are taking this medicine. This may interfere with the ability to father a child. You should talk to your doctor or health care professional if you are concerned about your fertility. What side effects may I notice from receiving this medicine? Side effects that you should report to your doctor or health care professional as soon as possible:  allergic reactions like skin rash, itching or hives, swelling of the face, lips, or tongue  blurred vision  breathing problems  changes in vision  low blood counts - This drug may decrease the number of white blood cells,  red blood cells and platelets. You may be at increased risk for infections and bleeding.  nausea and vomiting  pain, redness or irritation at site where injected  pain, tingling, numbness in the hands or feet  redness, blistering, peeling, or loosening of the skin, including inside the mouth  signs of decreased platelets or bleeding - bruising, pinpoint red spots on the skin, black, tarry stools, nosebleeds  signs of decreased red blood cells - unusually weak or tired, fainting spells, lightheadedness  signs of infection - fever or chills, cough, sore throat, pain or difficulty passing urine  swelling of the ankle, feet, hands Side effects that usually do not require medical attention (report to your doctor or health care professional if they continue or are bothersome):  constipation  diarrhea  fingernail or toenail changes  hair loss  loss of appetite  mouth sores  muscle pain This list may not describe all possible side effects. Call your doctor for medical advice about side effects. You may report side effects to FDA at 1-800-FDA-1088. Where should I keep my medicine? This drug is given in a hospital or clinic and will not be stored at home. NOTE: This sheet is a summary. It may not cover all possible information. If you have questions about this medicine, talk to your doctor, pharmacist, or health care provider.  2021 Elsevier/Gold Standard (2019-02-02 19:50:31)  Carboplatin injection What is this medicine? CARBOPLATIN (KAR boe pla tin) is a chemotherapy drug. It targets fast dividing cells, like cancer cells, and causes these cells to die. This medicine is used to treat ovarian cancer and many other cancers. This medicine may be used for other purposes; ask your health care provider or pharmacist if you have questions. COMMON BRAND NAME(S): Paraplatin What should I tell my health care provider before I take this medicine? They need to know if you have any of these  conditions:  blood disorders  hearing problems  kidney disease  recent or ongoing radiation therapy  an unusual or allergic reaction to carboplatin, cisplatin, other chemotherapy, other medicines, foods, dyes, or preservatives  pregnant or trying to get pregnant  breast-feeding How should I use this medicine? This drug is usually given as an infusion into a vein. It is administered in a hospital or clinic by a specially trained health care professional. Talk to your pediatrician regarding the use of this medicine in children. Special care may be needed. Overdosage: If you think you have taken too much of this medicine contact a poison control center or emergency room at once. NOTE: This medicine is only for you. Do not share this medicine with others. What if I miss a dose? It is important not to miss a dose. Call your doctor or health care professional if you are unable to keep an appointment. What may interact with this medicine?  medicines for seizures  medicines to increase blood counts like filgrastim, pegfilgrastim, sargramostim  some antibiotics like amikacin, gentamicin, neomycin, streptomycin, tobramycin  vaccines Talk to your doctor or health care professional before taking any of these medicines:  acetaminophen  aspirin  ibuprofen  ketoprofen  naproxen This list may not describe all possible interactions. Give your health care provider a list of all the medicines, herbs, non-prescription drugs, or dietary supplements you use. Also tell them if you smoke, drink alcohol, or use illegal drugs. Some items may interact with your medicine. What should I watch for while using this medicine? Your condition will be monitored carefully while you are receiving this medicine. You will need important blood work done while you are taking this medicine. This drug may make you feel generally unwell. This is not uncommon, as  chemotherapy can affect healthy cells as well as  cancer cells. Report any side effects. Continue your course of treatment even though you feel ill unless your doctor tells you to stop. In some cases, you may be given additional medicines to help with side effects. Follow all directions for their use. Call your doctor or health care professional for advice if you get a fever, chills or sore throat, or other symptoms of a cold or flu. Do not treat yourself. This drug decreases your body's ability to fight infections. Try to avoid being around people who are sick. This medicine may increase your risk to bruise or bleed. Call your doctor or health care professional if you notice any unusual bleeding. Be careful brushing and flossing your teeth or using a toothpick because you may get an infection or bleed more easily. If you have any dental work done, tell your dentist you are receiving this medicine. Avoid taking products that contain aspirin, acetaminophen, ibuprofen, naproxen, or ketoprofen unless instructed by your doctor. These medicines may hide a fever. Do not become pregnant while taking this medicine. Women should inform their doctor if they wish to become pregnant or think they might be pregnant. There is a potential for serious side effects to an unborn child. Talk to your health care professional or pharmacist for more information. Do not breast-feed an infant while taking this medicine. What side effects may I notice from receiving this medicine? Side effects that you should report to your doctor or health care professional as soon as possible:  allergic reactions like skin rash, itching or hives, swelling of the face, lips, or tongue  signs of infection - fever or chills, cough, sore throat, pain or difficulty passing urine  signs of decreased platelets or bleeding - bruising, pinpoint red spots on the skin, black, tarry stools, nosebleeds  signs of decreased red blood cells - unusually weak or tired, fainting spells,  lightheadedness  breathing problems  changes in hearing  changes in vision  chest pain  high blood pressure  low blood counts - This drug may decrease the number of white blood cells, red blood cells and platelets. You may be at increased risk for infections and bleeding.  nausea and vomiting  pain, swelling, redness or irritation at the injection site  pain, tingling, numbness in the hands or feet  problems with balance, talking, walking  trouble passing urine or change in the amount of urine Side effects that usually do not require medical attention (report to your doctor or health care professional if they continue or are bothersome):  hair loss  loss of appetite  metallic taste in the mouth or changes in taste This list may not describe all possible side effects. Call your doctor for medical advice about side effects. You may report side effects to FDA at 1-800-FDA-1088. Where should I keep my medicine? This drug is given in a hospital or clinic and will not be stored at home. NOTE: This sheet is a summary. It may not cover all possible information. If you have questions about this medicine, talk to your doctor, pharmacist, or health care provider.  2021 [employer]/Gold Standard (2007-06-10 14:38:05)

## 2020-08-25 ENCOUNTER — Telehealth: Payer: Self-pay | Admitting: Hematology and Oncology

## 2020-08-25 NOTE — Telephone Encounter (Signed)
Scheduled per 6/8 los. Pt will receive an updated appt calendar per next visit appts notes

## 2020-08-26 ENCOUNTER — Other Ambulatory Visit: Payer: Self-pay

## 2020-08-26 ENCOUNTER — Inpatient Hospital Stay: Payer: 59

## 2020-08-26 VITALS — BP 117/83 | HR 66 | Temp 98.1°F | Resp 18

## 2020-08-26 DIAGNOSIS — Z5112 Encounter for antineoplastic immunotherapy: Secondary | ICD-10-CM | POA: Diagnosis not present

## 2020-08-26 DIAGNOSIS — C50512 Malignant neoplasm of lower-outer quadrant of left female breast: Secondary | ICD-10-CM

## 2020-08-26 MED ORDER — PEGFILGRASTIM-BMEZ 6 MG/0.6ML ~~LOC~~ SOSY
6.0000 mg | PREFILLED_SYRINGE | Freq: Once | SUBCUTANEOUS | Status: AC
  Administered 2020-08-26: 6 mg via SUBCUTANEOUS

## 2020-08-26 MED ORDER — PEGFILGRASTIM-BMEZ 6 MG/0.6ML ~~LOC~~ SOSY
PREFILLED_SYRINGE | SUBCUTANEOUS | Status: AC
  Filled 2020-08-26: qty 0.6

## 2020-09-12 ENCOUNTER — Encounter: Payer: Self-pay | Admitting: *Deleted

## 2020-09-13 NOTE — Progress Notes (Signed)
Patient Care Team: Charlynn Court, NP as PCP - General (Nurse Practitioner) Rockwell Germany, RN as Oncology Nurse Navigator Mauro Kaufmann, RN as Oncology Nurse Navigator  DIAGNOSIS:    ICD-10-CM   1. Malignant neoplasm of lower-outer quadrant of left breast of female, estrogen receptor positive ([place])  C50.512    Z17.0       SUMMARY OF ONCOLOGIC HISTORY: Oncology History  Malignant neoplasm of lower-outer quadrant of left breast of female, estrogen receptor positive ([place])  06/23/2020 Initial Diagnosis   Normal mammogram September 2021.  December 2021: Following trauma to the breast she felt a lump but for variety of reasons work-up was postponed.  Mammogram revealed 2 tumors measuring 7 cm individually 4 cm and 3 cm, 3 axillary lymph nodes, biopsy revealed grade 3 IDC ER 10%, PR 0%, Ki-67 40%, HER-2 +3+ by IHC, lymph node also positive   06/28/2020 Cancer Staging   Staging form: Breast, AJCC 8th Edition - Clinical stage from 06/28/2020: Stage IIB (cT2, cN1, cM0, G3, ER+, PR-, HER2+) - Signed by Nicholas Lose, MD on 06/28/2020  Stage prefix: Initial diagnosis  Histologic grading system: 3 grade system    07/13/2020 -  Chemotherapy    Patient is on Treatment Plan: BREAST  DOCETAXEL + CARBOPLATIN + TRASTUZUMAB + PERTUZUMAB  (TCHP) Q21D          CHIEF COMPLIANT: Cycle 4 TCH Perjeta  INTERVAL HISTORY: DARIA MCMEEKIN is a 55 y.o. with above-mentioned history of HER-2 positive breast cancer who is currently on neoadjuvant chemotherapy with [place]. She presents to the clinic today for cycle 4.  ALLERGIES:  has No Known Allergies.  MEDICATIONS:  Current Outpatient Medications  Medication Sig Dispense Refill   azithromycin (ZITHROMAX) 250 MG tablet Take as directed 6 each 0   dexamethasone (DECADRON) 4 MG tablet Take 1 tablet (4 mg total) by mouth daily. Take 1 tablet day before chemo and 1 tablet day after chemo with food 12 tablet 0   fluconazole (DIFLUCAN) 200 MG tablet  Take 1 tablet (200 mg total) by mouth daily. 1 tablet 0   lidocaine-prilocaine (EMLA) cream Apply to affected area once 30 g 3   nystatin (MYCOSTATIN) 100000 UNIT/ML suspension Take 5 mLs (500,000 Units total) by mouth 4 (four) times daily. 60 mL 1   ondansetron (ZOFRAN) 8 MG tablet Take 1 tablet (8 mg total) by mouth 2 (two) times daily as needed (Nausea or vomiting). Start on the third day after chemotherapy. 30 tablet 1   prochlorperazine (COMPAZINE) 10 MG tablet Take 1 tablet (10 mg total) by mouth every 6 (six) hours as needed (Nausea or vomiting). 30 tablet 1   No current facility-administered medications for this visit.   Facility-Administered Medications Ordered in Other Visits  Medication Dose Route Frequency Provider Last Rate Last Admin   sodium chloride flush (NS) 0.9 % injection 10 mL  10 mL Intracatheter PRN Nicholas Lose, MD   10 mL at 09/14/20 1000    PHYSICAL EXAMINATION: ECOG PERFORMANCE STATUS: 1 - Symptomatic but completely ambulatory  There were no vitals filed for this visit. There were no vitals filed for this visit.   LABORATORY DATA:  I have reviewed the data as listed CMP Latest Ref Rng & Units 08/24/2020 08/03/2020 07/19/2020  Glucose 70 - 99 mg/dL 91 88 121(H)  BUN 6 - 20 mg/dL $Remove'13 14 11  'HHGsRWq$ Creatinine 0.44 - 1.00 mg/dL 0.67 0.62 0.71  Sodium 135 - 145 mmol/L 142 143 138  Potassium 3.5 - 5.1 mmol/L 3.6 3.6 4.0  Chloride 98 - 111 mmol/L 106 107 101  CO2 22 - 32 mmol/L $RemoveB'27 27 29  'PQrppPiH$ Calcium 8.9 - 10.3 mg/dL 9.4 9.3 9.2  Total Protein 6.5 - 8.1 g/dL 7.0 6.8 7.1  Total Bilirubin 0.3 - 1.2 mg/dL 0.3 0.2(L) 0.5  Alkaline Phos 38 - 126 U/L 99 96 94  AST 15 - 41 U/L 11(L) 10(L) 12(L)  ALT 0 - 44 U/L $Remo'10 10 20    'jvqhW$ Lab Results  Component Value Date   WBC 6.7 09/14/2020   HGB 11.4 (L) 09/14/2020   HCT 34.2 (L) 09/14/2020   MCV 94.0 09/14/2020   PLT 205 09/14/2020   NEUTROABS 3.7 09/14/2020    ASSESSMENT & PLAN:  Malignant neoplasm of lower-outer quadrant of left  breast of female, estrogen receptor positive ([place]) 06/23/2020: Normal mammogram September 2021.  December 2021: Following trauma to the breast she felt a lump but for variety of reasons work-up was postponed.  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   Treatment Plan: 1. Neoadjuvant chemotherapy with TCH Perjeta 6 cycles followed by Herceptin Perjeta maintenance versus Kadcyla maintenance (based on response to neoadjuvant chemo) for 1 year 2. Followed by breast conserving surgery if possible with axillary lymph node dissection 3. Followed by adjuvant radiation therapy 4.  Followed by antiestrogen therapy 5.  Followed by neratinib MRI liver 07/11/20: Hepatic cysts Bone scan and CT CAP: No mets Breast MRI 06/28/2020: 2 large left breast masses each 0.5 cm, overall 7 cm, third contiguous mass 3 cm (could be a lymph node, abutting pectoralis muscle), 4 lymph nodes left axilla ---------------------------------------------------------------------------------------------------------------------------- Current Treatment: Cycle 4 TCHP ECHO: 07/11/20: EF 60-65% Labs reviewed   Chemo Toxicities: Denies any nausea or vomiting. 1.   Diarrhea: Occasionally 3-4 loose stools per day.  Imodium is helping her 2. Fatigue day 4 today 6 3.  Alopecia 4.  Chemotherapy induced anemia: Monitoring hemoglobin 11.4  Monitoring blood counts.   I ordered the breast MRI and sent a message to Dr. Donne Hazel and our breast navigator's to plan the next steps after chemo gets done in August. RTC in 3 weeks for cycle 5    No orders of the defined types were placed in this encounter.  The patient has a good understanding of the overall plan. she agrees with it. she will call with any problems that may develop before the next visit here.  Total time spent: 30 mins including  face to face time and time spent for planning, charting and coordination of care  Rulon Eisenmenger, MD, MPH 09/14/2020  I, Reinaldo Raddle, am acting as scribe for Dr. Nicholas Lose, MD.

## 2020-09-13 NOTE — Assessment & Plan Note (Signed)
06/23/2020:Normal mammogram September 2021. December 2021: Following trauma to the breast she felt a lump but for variety of reasons work-up was postponed. 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  Treatment Plan: 1. Neoadjuvant chemotherapy with TCH Perjeta 6 cycles followed by Herceptin Perjeta maintenance versus Kadcyla maintenance (based on response to neoadjuvant chemo) for 1 year 2. Followed by breast conserving surgery if possible withaxillary lymph node dissection 3. Followed by adjuvant radiation therapy 4.Followed by antiestrogen therapy 5.Followed by neratinib MRI liver 07/11/20: Hepatic cysts Bone scan and CT CAP: No mets Breast MRI 06/28/2020: 2 large left breast masses each 0.5 cm, overall 7 cm, third contiguous mass 3 cm (could be a lymph node, abutting pectoralis muscle), 4 lymph nodes left axilla ---------------------------------------------------------------------------------------------------------------------------- Current Treatment:Cycle4TCHP ECHO: 07/11/20: EF 60-65% Labs reviewed  Chemo Toxicities: Denies any nausea or vomiting. 1.  Diarrhea: Occasionally 3-4 loose stools per day. She took Imodium which solve the problem. 2.Fatigue day 4 today 6 3.  Alopecia 4.  Chemotherapy induced anemia: Monitoring hemoglobin 11.8 5.  Thrush: Resolved Monitoring blood counts.  Along with the next treatment we will have genetic counseling appointment. RTC in3weeks for cycle 5

## 2020-09-14 ENCOUNTER — Inpatient Hospital Stay (HOSPITAL_BASED_OUTPATIENT_CLINIC_OR_DEPARTMENT_OTHER): Payer: 59 | Admitting: Hematology and Oncology

## 2020-09-14 ENCOUNTER — Other Ambulatory Visit: Payer: Self-pay

## 2020-09-14 ENCOUNTER — Inpatient Hospital Stay: Payer: 59

## 2020-09-14 ENCOUNTER — Other Ambulatory Visit: Payer: 59

## 2020-09-14 DIAGNOSIS — Z170 Estrogen receptor positive status [ER+]: Secondary | ICD-10-CM | POA: Diagnosis not present

## 2020-09-14 DIAGNOSIS — Z5112 Encounter for antineoplastic immunotherapy: Secondary | ICD-10-CM | POA: Diagnosis not present

## 2020-09-14 DIAGNOSIS — C50512 Malignant neoplasm of lower-outer quadrant of left female breast: Secondary | ICD-10-CM

## 2020-09-14 DIAGNOSIS — Z95828 Presence of other vascular implants and grafts: Secondary | ICD-10-CM

## 2020-09-14 LAB — CBC WITH DIFFERENTIAL (CANCER CENTER ONLY)
Abs Immature Granulocytes: 0.02 10*3/uL (ref 0.00–0.07)
Basophils Absolute: 0 10*3/uL (ref 0.0–0.1)
Basophils Relative: 1 %
Eosinophils Absolute: 0.2 10*3/uL (ref 0.0–0.5)
Eosinophils Relative: 2 %
HCT: 34.2 % — ABNORMAL LOW (ref 36.0–46.0)
Hemoglobin: 11.4 g/dL — ABNORMAL LOW (ref 12.0–15.0)
Immature Granulocytes: 0 %
Lymphocytes Relative: 30 %
Lymphs Abs: 2 10*3/uL (ref 0.7–4.0)
MCH: 31.3 pg (ref 26.0–34.0)
MCHC: 33.3 g/dL (ref 30.0–36.0)
MCV: 94 fL (ref 80.0–100.0)
Monocytes Absolute: 0.8 10*3/uL (ref 0.1–1.0)
Monocytes Relative: 11 %
Neutro Abs: 3.7 10*3/uL (ref 1.7–7.7)
Neutrophils Relative %: 56 %
Platelet Count: 205 10*3/uL (ref 150–400)
RBC: 3.64 MIL/uL — ABNORMAL LOW (ref 3.87–5.11)
RDW: 16.2 % — ABNORMAL HIGH (ref 11.5–15.5)
WBC Count: 6.7 10*3/uL (ref 4.0–10.5)
nRBC: 0 % (ref 0.0–0.2)

## 2020-09-14 LAB — CMP (CANCER CENTER ONLY)
ALT: 11 U/L (ref 0–44)
AST: 11 U/L — ABNORMAL LOW (ref 15–41)
Albumin: 3.6 g/dL (ref 3.5–5.0)
Alkaline Phosphatase: 96 U/L (ref 38–126)
Anion gap: 7 (ref 5–15)
BUN: 13 mg/dL (ref 6–20)
CO2: 26 mmol/L (ref 22–32)
Calcium: 9 mg/dL (ref 8.9–10.3)
Chloride: 108 mmol/L (ref 98–111)
Creatinine: 0.64 mg/dL (ref 0.44–1.00)
GFR, Estimated: >60 mL/min (ref >60)
Glucose, Bld: 86 mg/dL (ref 70–99)
Potassium: 3.4 mmol/L — ABNORMAL LOW (ref 3.5–5.1)
Sodium: 141 mmol/L (ref 135–145)
Total Bilirubin: 0.3 mg/dL (ref 0.3–1.2)
Total Protein: 6.7 g/dL (ref 6.5–8.1)

## 2020-09-14 MED ORDER — SODIUM CHLORIDE 0.9 % IV SOLN
75.0000 mg/m2 | Freq: Once | INTRAVENOUS | Status: AC
  Administered 2020-09-14: 140 mg via INTRAVENOUS
  Filled 2020-09-14: qty 14

## 2020-09-14 MED ORDER — ACETAMINOPHEN 325 MG PO TABS
650.0000 mg | ORAL_TABLET | Freq: Once | ORAL | Status: AC
  Administered 2020-09-14: 650 mg via ORAL

## 2020-09-14 MED ORDER — ACETAMINOPHEN 325 MG PO TABS
ORAL_TABLET | ORAL | Status: AC
  Filled 2020-09-14: qty 2

## 2020-09-14 MED ORDER — SODIUM CHLORIDE 0.9% FLUSH
10.0000 mL | INTRAVENOUS | Status: DC | PRN
  Administered 2020-09-14: 10 mL
  Filled 2020-09-14: qty 10

## 2020-09-14 MED ORDER — SODIUM CHLORIDE 0.9 % IV SOLN
Freq: Once | INTRAVENOUS | Status: AC
Start: 2020-09-14 — End: 2020-09-14
  Filled 2020-09-14: qty 250

## 2020-09-14 MED ORDER — PALONOSETRON HCL INJECTION 0.25 MG/5ML
0.2500 mg | Freq: Once | INTRAVENOUS | Status: AC
  Administered 2020-09-14: 0.25 mg via INTRAVENOUS

## 2020-09-14 MED ORDER — DIPHENHYDRAMINE HCL 25 MG PO CAPS
ORAL_CAPSULE | ORAL | Status: AC
  Filled 2020-09-14: qty 2

## 2020-09-14 MED ORDER — DEXAMETHASONE SODIUM PHOSPHATE 100 MG/10ML IJ SOLN
10.0000 mg | Freq: Once | INTRAMUSCULAR | Status: AC
  Administered 2020-09-14: 10 mg via INTRAVENOUS
  Filled 2020-09-14: qty 10

## 2020-09-14 MED ORDER — HEPARIN SOD (PORK) LOCK FLUSH 100 UNIT/ML IV SOLN
500.0000 [IU] | Freq: Once | INTRAVENOUS | Status: AC | PRN
  Administered 2020-09-14: 500 [IU]
  Filled 2020-09-14: qty 5

## 2020-09-14 MED ORDER — DIPHENHYDRAMINE HCL 25 MG PO CAPS
50.0000 mg | ORAL_CAPSULE | Freq: Once | ORAL | Status: AC
  Administered 2020-09-14: 50 mg via ORAL

## 2020-09-14 MED ORDER — SODIUM CHLORIDE 0.9 % IV SOLN
700.0000 mg | Freq: Once | INTRAVENOUS | Status: AC
  Administered 2020-09-14: 700 mg via INTRAVENOUS
  Filled 2020-09-14: qty 70

## 2020-09-14 MED ORDER — SODIUM CHLORIDE 0.9 % IV SOLN
150.0000 mg | Freq: Once | INTRAVENOUS | Status: AC
  Administered 2020-09-14: 150 mg via INTRAVENOUS
  Filled 2020-09-14: qty 150

## 2020-09-14 MED ORDER — SODIUM CHLORIDE 0.9 % IV SOLN
420.0000 mg | Freq: Once | INTRAVENOUS | Status: AC
  Administered 2020-09-14: 420 mg via INTRAVENOUS
  Filled 2020-09-14: qty 14

## 2020-09-14 MED ORDER — PALONOSETRON HCL INJECTION 0.25 MG/5ML
INTRAVENOUS | Status: AC
  Filled 2020-09-14: qty 5

## 2020-09-14 MED ORDER — TRASTUZUMAB-ANNS CHEMO 150 MG IV SOLR
6.0000 mg/kg | Freq: Once | INTRAVENOUS | Status: AC
  Administered 2020-09-14: 420 mg via INTRAVENOUS
  Filled 2020-09-14: qty 20

## 2020-09-14 NOTE — Patient Instructions (Signed)
[facility]  Discharge Instructions: Thank you for choosing [facility] to provide your oncology and hematology care.   If you have a lab appointment with the [facility], please go directly to the [facility] and check in at the registration area.   Wear comfortable clothing and clothing appropriate for easy access to any Portacath or PICC line.   We strive to give you quality time with your provider. You may need to reschedule your appointment if you arrive late (15 or more minutes).  Arriving late affects you and other patients whose appointments are after yours.  Also, if you miss three or more appointments without notifying the office, you may be dismissed from the clinic at the provider's discretion.      For prescription refill requests, have your pharmacy contact our office and allow 72 hours for refills to be completed.    Today you received the following chemotherapy and/or immunotherapy agents Kanjinti, Perjeta, Taxotere, Carboplatin    To help prevent nausea and vomiting after your treatment, we encourage you to take your nausea medication as directed.  BELOW ARE SYMPTOMS THAT SHOULD BE REPORTED IMMEDIATELY: *FEVER GREATER THAN 100.4 F (38 C) OR HIGHER *CHILLS OR SWEATING *NAUSEA AND VOMITING THAT IS NOT CONTROLLED WITH YOUR NAUSEA MEDICATION *UNUSUAL SHORTNESS OF BREATH *UNUSUAL BRUISING OR BLEEDING *URINARY PROBLEMS (pain or burning when urinating, or frequent urination) *BOWEL PROBLEMS (unusual diarrhea, constipation, pain near the anus) TENDERNESS IN MOUTH AND THROAT WITH OR WITHOUT PRESENCE OF ULCERS (sore throat, sores in mouth, or a toothache) UNUSUAL RASH, SWELLING OR PAIN  UNUSUAL VAGINAL DISCHARGE OR ITCHING   Items with * indicate a potential emergency and should be followed up as soon as possible or go to the Emergency Department if any problems should occur.  Please show the CHEMOTHERAPY ALERT CARD or  IMMUNOTHERAPY ALERT CARD at check-in to the Emergency Department and triage nurse.  Should you have questions after your visit or need to cancel or reschedule your appointment, please contact [facility]  Dept: [phone]  and follow the prompts.  Office hours are 8:00 a.m. to 4:30 p.m. Monday - Friday. Please note that voicemails left after 4:00 p.m. may not be returned until the following business day.  We are closed weekends and major holidays. You have access to a nurse at all times for urgent questions. Please call the main number to the clinic Dept: [phone] and follow the prompts.   For any non-urgent questions, you may also contact your provider using MyChart. We now offer e-Visits for anyone 53 and older to request care online for non-urgent symptoms. For details visit [email or web address].   Also download the MyChart app! Go to the app store, search "MyChart", open the app, select [facility], and log in with your MyChart username and password.  Due to Covid, a mask is required upon entering the hospital/clinic. If you do not have a mask, one will be given to you upon arrival. For doctor visits, patients may have 1 support person aged 59 or older with them. For treatment visits, patients cannot have anyone with them due to current Covid guidelines and our immunocompromised population.   Trastuzumab injection for infusion What is this medicine? TRASTUZUMAB (tras TOO zoo mab) is a monoclonal antibody. It is used to treat breast cancer and stomach cancer. This medicine may be used for other purposes; ask your health care provider or pharmacist if you have questions. COMMON BRAND  NAME(S): Herceptin, Galvin Proffer, Trazimera What should I tell my health care provider before I take this medicine? They need to know if you have any of these conditions: heart disease heart failure lung or breathing disease, like asthma an unusual or  allergic reaction to trastuzumab, benzyl alcohol, or other medications, foods, dyes, or preservatives pregnant or trying to get pregnant breast-feeding How should I use this medicine? This drug is given as an infusion into a vein. It is administered in a hospital or clinic by a specially trained health care professional. Talk to your pediatrician regarding the use of this medicine in children. This medicine is not approved for use in children. Overdosage: If you think you have taken too much of this medicine contact a poison control center or emergency room at once. NOTE: This medicine is only for you. Do not share this medicine with others. What if I miss a dose? It is important not to miss a dose. Call your doctor or health care professional if you are unable to keep an appointment. What may interact with this medicine? This medicine may interact with the following medications: certain types of chemotherapy, such as daunorubicin, doxorubicin, epirubicin, and idarubicin This list may not describe all possible interactions. Give your health care provider a list of all the medicines, herbs, non-prescription drugs, or dietary supplements you use. Also tell them if you smoke, drink alcohol, or use illegal drugs. Some items may interact with your medicine. What should I watch for while using this medicine? Visit your doctor for checks on your progress. Report any side effects. Continue your course of treatment even though you feel ill unless your doctor tells you to stop. Call your doctor or health care professional for advice if you get a fever, chills or sore throat, or other symptoms of a cold or flu. Do not treat yourself. Try to avoid being around people who are sick. You may experience fever, chills and shaking during your first infusion. These effects are usually mild and can be treated with other medicines. Report any side effects during the infusion to your health care professional. Fever and  chills usually do not happen with later infusions. Do not become pregnant while taking this medicine or for 7 months after stopping it. Women should inform their doctor if they wish to become pregnant or think they might be pregnant. Women of child-bearing potential will need to have a negative pregnancy test before starting this medicine. There is a potential for serious side effects to an unborn child. Talk to your health care professional or pharmacist for more information. Do not breast-feed an infant while taking this medicine or for 7 months after stopping it. Women must use effective birth control with this medicine. What side effects may I notice from receiving this medicine? Side effects that you should report to your doctor or health care professional as soon as possible: allergic reactions like skin rash, itching or hives, swelling of the face, lips, or tongue chest pain or palpitations cough dizziness feeling faint or lightheaded, falls fever general ill feeling or flu-like symptoms signs of worsening heart failure like breathing problems; swelling in your legs and feet unusually weak or tired Side effects that usually do not require medical attention (report to your doctor or health care professional if they continue or are bothersome): bone pain changes in taste diarrhea joint pain nausea/vomiting weight loss This list may not describe all possible side effects. Call your doctor for medical advice about  side effects. You may report side effects to FDA at 1-800-FDA-1088. Where should I keep my medicine? This drug is given in a hospital or clinic and will not be stored at home. NOTE: This sheet is a summary. It may not cover all possible information. If you have questions about this medicine, talk to your doctor, pharmacist, or health care provider.  2021 Elsevier/Gold Standard (2016-02-28 14:37:52)  Pertuzumab injection What is this medicine? PERTUZUMAB (per TOOZ ue mab) is  a monoclonal antibody. It is used to treat breast cancer. This medicine may be used for other purposes; ask your health care provider or pharmacist if you have questions. COMMON BRAND NAME(S): PERJETA What should I tell my health care provider before I take this medicine? They need to know if you have any of these conditions: heart disease heart failure high blood pressure history of irregular heart beat recent or ongoing radiation therapy an unusual or allergic reaction to pertuzumab, other medicines, foods, dyes, or preservatives pregnant or trying to get pregnant breast-feeding How should I use this medicine? This medicine is for infusion into a vein. It is given by a health care professional in a hospital or clinic setting. Talk to your pediatrician regarding the use of this medicine in children. Special care may be needed. Overdosage: If you think you have taken too much of this medicine contact a poison control center or emergency room at once. NOTE: This medicine is only for you. Do not share this medicine with others. What if I miss a dose? It is important not to miss your dose. Call your doctor or health care professional if you are unable to keep an appointment. What may interact with this medicine? Interactions are not expected. Give your health care provider a list of all the medicines, herbs, non-prescription drugs, or dietary supplements you use. Also tell them if you smoke, drink alcohol, or use illegal drugs. Some items may interact with your medicine. This list may not describe all possible interactions. Give your health care provider a list of all the medicines, herbs, non-prescription drugs, or dietary supplements you use. Also tell them if you smoke, drink alcohol, or use illegal drugs. Some items may interact with your medicine. What should I watch for while using this medicine? Your condition will be monitored carefully while you are receiving this medicine. Report any  side effects. Continue your course of treatment even though you feel ill unless your doctor tells you to stop. Do not become pregnant while taking this medicine or for 7 months after stopping it. Women should inform their doctor if they wish to become pregnant or think they might be pregnant. Women of child-bearing potential will need to have a negative pregnancy test before starting this medicine. There is a potential for serious side effects to an unborn child. Talk to your health care professional or pharmacist for more information. Do not breast-feed an infant while taking this medicine or for 7 months after stopping it. Women must use effective birth control with this medicine. Call your doctor or health care professional for advice if you get a fever, chills or sore throat, or other symptoms of a cold or flu. Do not treat yourself. Try to avoid being around people who are sick. You may experience fever, chills, and headache during the infusion. Report any side effects during the infusion to your health care professional. What side effects may I notice from receiving this medicine? Side effects that you should report to your doctor or  health care professional as soon as possible: breathing problems chest pain or palpitations dizziness feeling faint or lightheaded fever or chills skin rash, itching or hives sore throat swelling of the face, lips, or tongue swelling of the legs or ankles unusually weak or tired Side effects that usually do not require medical attention (report to your doctor or health care professional if they continue or are bothersome): diarrhea hair loss nausea, vomiting tiredness This list may not describe all possible side effects. Call your doctor for medical advice about side effects. You may report side effects to FDA at 1-800-FDA-1088. Where should I keep my medicine? This drug is given in a hospital or clinic and will not be stored at home. NOTE: This sheet is a  summary. It may not cover all possible information. If you have questions about this medicine, talk to your doctor, pharmacist, or health care provider.  2021 Elsevier/Gold Standard (2015-04-07 12:08:50)   Pertuzumab injection What is this medicine? PERTUZUMAB (per TOOZ ue mab) is a monoclonal antibody. It is used to treat breast cancer. This medicine may be used for other purposes; ask your health care provider or pharmacist if you have questions. COMMON BRAND NAME(S): PERJETA What should I tell my health care provider before I take this medicine? They need to know if you have any of these conditions: heart disease heart failure high blood pressure history of irregular heart beat recent or ongoing radiation therapy an unusual or allergic reaction to pertuzumab, other medicines, foods, dyes, or preservatives pregnant or trying to get pregnant breast-feeding How should I use this medicine? This medicine is for infusion into a vein. It is given by a health care professional in a hospital or clinic setting. Talk to your pediatrician regarding the use of this medicine in children. Special care may be needed. Overdosage: If you think you have taken too much of this medicine contact a poison control center or emergency room at once. NOTE: This medicine is only for you. Do not share this medicine with others. What if I miss a dose? It is important not to miss your dose. Call your doctor or health care professional if you are unable to keep an appointment. What may interact with this medicine? Interactions are not expected. Give your health care provider a list of all the medicines, herbs, non-prescription drugs, or dietary supplements you use. Also tell them if you smoke, drink alcohol, or use illegal drugs. Some items may interact with your medicine. This list may not describe all possible interactions. Give your health care provider a list of all the medicines, herbs, non-prescription drugs,  or dietary supplements you use. Also tell them if you smoke, drink alcohol, or use illegal drugs. Some items may interact with your medicine. What should I watch for while using this medicine? Your condition will be monitored carefully while you are receiving this medicine. Report any side effects. Continue your course of treatment even though you feel ill unless your doctor tells you to stop. Do not become pregnant while taking this medicine or for 7 months after stopping it. Women should inform their doctor if they wish to become pregnant or think they might be pregnant. Women of child-bearing potential will need to have a negative pregnancy test before starting this medicine. There is a potential for serious side effects to an unborn child. Talk to your health care professional or pharmacist for more information. Do not breast-feed an infant while taking this medicine or for 7 months after stopping  it. Women must use effective birth control with this medicine. Call your doctor or health care professional for advice if you get a fever, chills or sore throat, or other symptoms of a cold or flu. Do not treat yourself. Try to avoid being around people who are sick. You may experience fever, chills, and headache during the infusion. Report any side effects during the infusion to your health care professional. What side effects may I notice from receiving this medicine? Side effects that you should report to your doctor or health care professional as soon as possible: breathing problems chest pain or palpitations dizziness feeling faint or lightheaded fever or chills skin rash, itching or hives sore throat swelling of the face, lips, or tongue swelling of the legs or ankles unusually weak or tired Side effects that usually do not require medical attention (report to your doctor or health care professional if they continue or are bothersome): diarrhea hair loss nausea, vomiting tiredness This  list may not describe all possible side effects. Call your doctor for medical advice about side effects. You may report side effects to FDA at 1-800-FDA-1088. Where should I keep my medicine? This drug is given in a hospital or clinic and will not be stored at home. NOTE: This sheet is a summary. It may not cover all possible information. If you have questions about this medicine, talk to your doctor, pharmacist, or health care provider.  2021 Elsevier/Gold Standard (2015-04-07 12:08:50)  Docetaxel injection What is this medicine? DOCETAXEL (doe se TAX el) is a chemotherapy drug. It targets fast dividing cells, like cancer cells, and causes these cells to die. This medicine is used to treat many types of cancers like breast cancer, certain stomach cancers, head and neck cancer, lung cancer, and prostate cancer. This medicine may be used for other purposes; ask your health care provider or pharmacist if you have questions. COMMON BRAND NAME(S): Docefrez, Taxotere What should I tell my health care provider before I take this medicine? They need to know if you have any of these conditions: infection (especially a virus infection such as chickenpox, cold sores, or herpes) liver disease low blood counts, like low white cell, platelet, or red cell counts an unusual or allergic reaction to docetaxel, polysorbate 80, other chemotherapy agents, other medicines, foods, dyes, or preservatives pregnant or trying to get pregnant breast-feeding How should I use this medicine? This drug is given as an infusion into a vein. It is administered in a hospital or clinic by a specially trained health care professional. Talk to your pediatrician regarding the use of this medicine in children. Special care may be needed. Overdosage: If you think you have taken too much of this medicine contact a poison control center or emergency room at once. NOTE: This medicine is only for you. Do not share this medicine with  others. What if I miss a dose? It is important not to miss your dose. Call your doctor or health care professional if you are unable to keep an appointment. What may interact with this medicine? Do not take this medicine with any of the following medications: live virus vaccines This medicine may also interact with the following medications: aprepitant certain antibiotics like erythromycin or clarithromycin certain antivirals for HIV or hepatitis certain medicines for fungal infections like fluconazole, itraconazole, ketoconazole, posaconazole, or voriconazole cimetidine ciprofloxacin conivaptan cyclosporine dronedarone fluvoxamine grapefruit juice imatinib verapamil This list may not describe all possible interactions. Give your health care provider a list of all the  medicines, herbs, non-prescription drugs, or dietary supplements you use. Also tell them if you smoke, drink alcohol, or use illegal drugs. Some items may interact with your medicine. What should I watch for while using this medicine? Your condition will be monitored carefully while you are receiving this medicine. You will need important blood work done while you are taking this medicine. Call your doctor or health care professional for advice if you get a fever, chills or sore throat, or other symptoms of a cold or flu. Do not treat yourself. This drug decreases your body's ability to fight infections. Try to avoid being around people who are sick. Some products may contain alcohol. Ask your health care professional if this medicine contains alcohol. Be sure to tell all health care professionals you are taking this medicine. Certain medicines, like metronidazole and disulfiram, can cause an unpleasant reaction when taken with alcohol. The reaction includes flushing, headache, nausea, vomiting, sweating, and increased thirst. The reaction can last from 30 minutes to several hours. You may get drowsy or dizzy. Do not drive, use  machinery, or do anything that needs mental alertness until you know how this medicine affects you. Do not stand or sit up quickly, especially if you are an older patient. This reduces the risk of dizzy or fainting spells. Alcohol may interfere with the effect of this medicine. Talk to your health care professional about your risk of cancer. You may be more at risk for certain types of cancer if you take this medicine. Do not become pregnant while taking this medicine or for 6 months after stopping it. Women should inform their doctor if they wish to become pregnant or think they might be pregnant. There is a potential for serious side effects to an unborn child. Talk to your health care professional or pharmacist for more information. Do not breast-feed an infant while taking this medicine or for 1 week after stopping it. Males who get this medicine must use a condom during sex with females who can get pregnant. If you get a woman pregnant, the baby could have birth defects. The baby could die before they are born. You will need to continue wearing a condom for 3 months after stopping the medicine. Tell your health care provider right away if your partner becomes pregnant while you are taking this medicine. This may interfere with the ability to father a child. You should talk to your doctor or health care professional if you are concerned about your fertility. What side effects may I notice from receiving this medicine? Side effects that you should report to your doctor or health care professional as soon as possible: allergic reactions like skin rash, itching or hives, swelling of the face, lips, or tongue blurred vision breathing problems changes in vision low blood counts - This drug may decrease the number of white blood cells, red blood cells and platelets. You may be at increased risk for infections and bleeding. nausea and vomiting pain, redness or irritation at site where injected pain,  tingling, numbness in the hands or feet redness, blistering, peeling, or loosening of the skin, including inside the mouth signs of decreased platelets or bleeding - bruising, pinpoint red spots on the skin, black, tarry stools, nosebleeds signs of decreased red blood cells - unusually weak or tired, fainting spells, lightheadedness signs of infection - fever or chills, cough, sore throat, pain or difficulty passing urine swelling of the ankle, feet, hands Side effects that usually do not require medical attention (  report to your doctor or health care professional if they continue or are bothersome): constipation diarrhea fingernail or toenail changes hair loss loss of appetite mouth sores muscle pain This list may not describe all possible side effects. Call your doctor for medical advice about side effects. You may report side effects to FDA at 1-800-FDA-1088. Where should I keep my medicine? This drug is given in a hospital or clinic and will not be stored at home. NOTE: This sheet is a summary. It may not cover all possible information. If you have questions about this medicine, talk to your doctor, pharmacist, or health care provider.  2021 Elsevier/Gold Standard (2019-02-02 19:50:31)  Carboplatin injection What is this medicine? CARBOPLATIN (KAR boe pla tin) is a chemotherapy drug. It targets fast dividing cells, like cancer cells, and causes these cells to die. This medicine is used to treat ovarian cancer and many other cancers. This medicine may be used for other purposes; ask your health care provider or pharmacist if you have questions. COMMON BRAND NAME(S): Paraplatin What should I tell my health care provider before I take this medicine? They need to know if you have any of these conditions: blood disorders hearing problems kidney disease recent or ongoing radiation therapy an unusual or allergic reaction to carboplatin, cisplatin, other chemotherapy, other medicines,  foods, dyes, or preservatives pregnant or trying to get pregnant breast-feeding How should I use this medicine? This drug is usually given as an infusion into a vein. It is administered in a hospital or clinic by a specially trained health care professional. Talk to your pediatrician regarding the use of this medicine in children. Special care may be needed. Overdosage: If you think you have taken too much of this medicine contact a poison control center or emergency room at once. NOTE: This medicine is only for you. Do not share this medicine with others. What if I miss a dose? It is important not to miss a dose. Call your doctor or health care professional if you are unable to keep an appointment. What may interact with this medicine? medicines for seizures medicines to increase blood counts like filgrastim, pegfilgrastim, sargramostim some antibiotics like amikacin, gentamicin, neomycin, streptomycin, tobramycin vaccines Talk to your doctor or health care professional before taking any of these medicines: acetaminophen aspirin ibuprofen ketoprofen naproxen This list may not describe all possible interactions. Give your health care provider a list of all the medicines, herbs, non-prescription drugs, or dietary supplements you use. Also tell them if you smoke, drink alcohol, or use illegal drugs. Some items may interact with your medicine. What should I watch for while using this medicine? Your condition will be monitored carefully while you are receiving this medicine. You will need important blood work done while you are taking this medicine. This drug may make you feel generally unwell. This is not uncommon, as chemotherapy can affect healthy cells as well as cancer cells. Report any side effects. Continue your course of treatment even though you feel ill unless your doctor tells you to stop. In some cases, you may be given additional medicines to help with side effects. Follow all  directions for their use. Call your doctor or health care professional for advice if you get a fever, chills or sore throat, or other symptoms of a cold or flu. Do not treat yourself. This drug decreases your body's ability to fight infections. Try to avoid being around people who are sick. This medicine may increase your risk to bruise or bleed.  Call your doctor or health care professional if you notice any unusual bleeding. Be careful brushing and flossing your teeth or using a toothpick because you may get an infection or bleed more easily. If you have any dental work done, tell your dentist you are receiving this medicine. Avoid taking products that contain aspirin, acetaminophen, ibuprofen, naproxen, or ketoprofen unless instructed by your doctor. These medicines may hide a fever. Do not become pregnant while taking this medicine. Women should inform their doctor if they wish to become pregnant or think they might be pregnant. There is a potential for serious side effects to an unborn child. Talk to your health care professional or pharmacist for more information. Do not breast-feed an infant while taking this medicine. What side effects may I notice from receiving this medicine? Side effects that you should report to your doctor or health care professional as soon as possible: allergic reactions like skin rash, itching or hives, swelling of the face, lips, or tongue signs of infection - fever or chills, cough, sore throat, pain or difficulty passing urine signs of decreased platelets or bleeding - bruising, pinpoint red spots on the skin, black, tarry stools, nosebleeds signs of decreased red blood cells - unusually weak or tired, fainting spells, lightheadedness breathing problems changes in hearing changes in vision chest pain high blood pressure low blood counts - This drug may decrease the number of white blood cells, red blood cells and platelets. You may be at increased risk for  infections and bleeding. nausea and vomiting pain, swelling, redness or irritation at the injection site pain, tingling, numbness in the hands or feet problems with balance, talking, walking trouble passing urine or change in the amount of urine Side effects that usually do not require medical attention (report to your doctor or health care professional if they continue or are bothersome): hair loss loss of appetite metallic taste in the mouth or changes in taste This list may not describe all possible side effects. Call your doctor for medical advice about side effects. You may report side effects to FDA at 1-800-FDA-1088. Where should I keep my medicine? This drug is given in a hospital or clinic and will not be stored at home. NOTE: This sheet is a summary. It may not cover all possible information. If you have questions about this medicine, talk to your doctor, pharmacist, or health care provider.  2021 Elsevier/Gold Standard (2007-06-10 14:38:05)

## 2020-09-15 ENCOUNTER — Telehealth: Payer: Self-pay | Admitting: Hematology and Oncology

## 2020-09-15 NOTE — Telephone Encounter (Signed)
Scheduled per 6/29 los. Pt will receive an updated appt calendar per next visit appt notes

## 2020-09-16 ENCOUNTER — Other Ambulatory Visit: Payer: Self-pay

## 2020-09-16 ENCOUNTER — Ambulatory Visit: Payer: 59

## 2020-09-16 ENCOUNTER — Inpatient Hospital Stay: Payer: 59 | Attending: Hematology and Oncology

## 2020-09-16 VITALS — BP 100/68 | HR 72 | Temp 98.2°F | Resp 18

## 2020-09-16 DIAGNOSIS — Z170 Estrogen receptor positive status [ER+]: Secondary | ICD-10-CM

## 2020-09-16 DIAGNOSIS — Z5112 Encounter for antineoplastic immunotherapy: Secondary | ICD-10-CM | POA: Diagnosis present

## 2020-09-16 DIAGNOSIS — C50512 Malignant neoplasm of lower-outer quadrant of left female breast: Secondary | ICD-10-CM | POA: Diagnosis present

## 2020-09-16 DIAGNOSIS — Z5189 Encounter for other specified aftercare: Secondary | ICD-10-CM | POA: Diagnosis not present

## 2020-09-16 DIAGNOSIS — Z5111 Encounter for antineoplastic chemotherapy: Secondary | ICD-10-CM | POA: Diagnosis present

## 2020-09-16 MED ORDER — PEGFILGRASTIM-BMEZ 6 MG/0.6ML ~~LOC~~ SOSY
PREFILLED_SYRINGE | SUBCUTANEOUS | Status: AC
  Filled 2020-09-16: qty 0.6

## 2020-09-16 MED ORDER — PEGFILGRASTIM-BMEZ 6 MG/0.6ML ~~LOC~~ SOSY
6.0000 mg | PREFILLED_SYRINGE | Freq: Once | SUBCUTANEOUS | Status: AC
  Administered 2020-09-16: 6 mg via SUBCUTANEOUS

## 2020-09-16 NOTE — Patient Instructions (Signed)

## 2020-09-27 ENCOUNTER — Telehealth: Payer: Self-pay | Admitting: *Deleted

## 2020-09-27 NOTE — Telephone Encounter (Signed)
Spoke with patient to let her know that she has an appointment with Dr. Donne Hazel on 8/15 at 11am. She stated she also received an email with this info as well.

## 2020-10-04 NOTE — Progress Notes (Signed)
Patient Care Team: Hal Morales, NP as PCP - General (Nurse Practitioner) Donnelly Angelica, RN as Oncology Nurse Navigator Pershing Proud, RN as Oncology Nurse Navigator  DIAGNOSIS:    ICD-10-CM   1. Malignant neoplasm of lower-outer quadrant of left breast of female, estrogen receptor positive (HCC)  C50.512 ECHOCARDIOGRAM COMPLETE   Z17.0       SUMMARY OF ONCOLOGIC HISTORY: Oncology History  Malignant neoplasm of lower-outer quadrant of left breast of female, estrogen receptor positive (HCC)  06/23/2020 Initial Diagnosis   Normal mammogram September 2021.  December 2021: Following trauma to the breast she felt a lump but for variety of reasons work-up was postponed.  Mammogram revealed 2 tumors measuring 7 cm individually 4 cm and 3 cm, 3 axillary lymph nodes, biopsy revealed grade 3 IDC ER 10%, PR 0%, Ki-67 40%, HER-2 +3+ by IHC, lymph node also positive   06/28/2020 Cancer Staging   Staging form: Breast, AJCC 8th Edition - Clinical stage from 06/28/2020: Stage IIB (cT2, cN1, cM0, G3, ER+, PR-, HER2+) - Signed by Serena Croissant, MD on 06/28/2020  Stage prefix: Initial diagnosis  Histologic grading system: 3 grade system    07/13/2020 -  Chemotherapy    Patient is on Treatment Plan: BREAST  DOCETAXEL + CARBOPLATIN + TRASTUZUMAB + PERTUZUMAB  (TCHP) Q21D          CHIEF COMPLIANT: Cycle 5 TCH Perjeta  INTERVAL HISTORY: Janet [name] is a 55 y.o. with above-mentioned history of HER-2 positive breast cancer who is currently on neoadjuvant chemotherapy with TCH Perjeta. She presents to the clinic today for cycle 5.   ALLERGIES:  has No Known Allergies.  MEDICATIONS:  Current Outpatient Medications  Medication Sig Dispense Refill   azithromycin (ZITHROMAX) 250 MG tablet Take as directed 6 each 0   dexamethasone (DECADRON) 4 MG tablet Take 1 tablet (4 mg total) by mouth daily. Take 1 tablet day before chemo and 1 tablet day after chemo with food 12 tablet 0   fluconazole  (DIFLUCAN) 200 MG tablet Take 1 tablet (200 mg total) by mouth daily. 1 tablet 0   lidocaine-prilocaine (EMLA) cream Apply to affected area once 30 g 3   nystatin (MYCOSTATIN) 100000 UNIT/ML suspension Take 5 mLs (500,000 Units total) by mouth 4 (four) times daily. 60 mL 1   ondansetron (ZOFRAN) 8 MG tablet Take 1 tablet (8 mg total) by mouth 2 (two) times daily as needed (Nausea or vomiting). Start on the third day after chemotherapy. 30 tablet 1   prochlorperazine (COMPAZINE) 10 MG tablet Take 1 tablet (10 mg total) by mouth every 6 (six) hours as needed (Nausea or vomiting). 30 tablet 1   No current facility-administered medications for this visit.   Facility-Administered Medications Ordered in Other Visits  Medication Dose Route Frequency Provider Last Rate Last Admin   sodium chloride flush (NS) 0.9 % injection 10 mL  10 mL Intracatheter PRN Serena Croissant, MD   10 mL at 10/05/20 0942    PHYSICAL EXAMINATION: ECOG PERFORMANCE STATUS: 1 - Symptomatic but completely ambulatory  Vitals:   10/05/20 1000  BP: 112/67  Pulse: 79  Resp: 18  Temp: 97.7 F (36.5 C)  SpO2: 100%   Filed Weights   10/05/20 1000  Weight: 147 lb 11.2 oz (67 kg)    LABORATORY DATA:  I have reviewed the data as listed CMP Latest Ref Rng & Units 09/14/2020 08/24/2020 08/03/2020  Glucose 70 - 99 mg/dL 86 91 88  BUN  6 - 20 mg/dL $Remove'13 13 14  'xWAOJde$ Creatinine 0.44 - 1.00 mg/dL 0.64 0.67 0.62  Sodium 135 - 145 mmol/L 141 142 143  Potassium 3.5 - 5.1 mmol/L 3.4(L) 3.6 3.6  Chloride 98 - 111 mmol/L 108 106 107  CO2 22 - 32 mmol/L $RemoveB'26 27 27  'vynmOCSB$ Calcium 8.9 - 10.3 mg/dL 9.0 9.4 9.3  Total Protein 6.5 - 8.1 g/dL 6.7 7.0 6.8  Total Bilirubin 0.3 - 1.2 mg/dL 0.3 0.3 0.2(L)  Alkaline Phos 38 - 126 U/L 96 99 96  AST 15 - 41 U/L 11(L) 11(L) 10(L)  ALT 0 - 44 U/L $Remo'11 10 10    'mpVMz$ Lab Results  Component Value Date   WBC 7.8 10/05/2020   HGB 11.3 (L) 10/05/2020   HCT 34.1 (L) 10/05/2020   MCV 95.3 10/05/2020   PLT 193 10/05/2020    NEUTROABS 4.5 10/05/2020    ASSESSMENT & PLAN:  Malignant neoplasm of lower-outer quadrant of left breast of female, estrogen receptor positive ([place]) 06/23/2020: Normal mammogram September 2021.  December 2021: Following trauma to the breast she felt a lump but for variety of reasons work-up was postponed.  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   Treatment Plan: 1. Neoadjuvant chemotherapy with TCH Perjeta 6 cycles followed by Herceptin Perjeta maintenance versus Kadcyla maintenance (based on response to neoadjuvant chemo) for 1 year 2. Followed by breast conserving surgery if possible with axillary lymph node dissection 3. Followed by adjuvant radiation therapy 4.  Followed by antiestrogen therapy 5.  Followed by neratinib MRI liver 07/11/20: Hepatic cysts Bone scan and CT CAP: No mets Breast MRI 06/28/2020: 2 large left breast masses each 0.5 cm, overall 7 cm, third contiguous mass 3 cm (could be a lymph node, abutting pectoralis muscle), 4 lymph nodes left axilla ---------------------------------------------------------------------------------------------------------------------------- Current Treatment: Cycle 5 TCHP ECHO: 07/11/20: EF 60-65% Labs reviewed   Chemo Toxicities: Denies any nausea or vomiting. 1.   Diarrhea: Occasionally 3-4 loose stools per day.  Imodium is helping her 2. Fatigue day 4 today 6 3.  Alopecia 4.  Chemotherapy induced anemia: Monitoring hemoglobin 11.4   Monitoring blood counts. Breast MRI ordered Patient has appointment to see Dr. Donne Hazel.  RTC in 3 weeks for cycle 6    Orders Placed This Encounter  Procedures   ECHOCARDIOGRAM COMPLETE    Standing Status:   Future    Standing Expiration Date:   10/04/2021    Scheduling Instructions:     Please contact patient once scheduled with date and time.   Thank you.    Order Specific Question:   Where should this test be performed    Answer:   [facility]    Order Specific Question:   Perflutren DEFINITY (image enhancing agent) should be administered unless hypersensitivity or allergy exist    Answer:   Administer Perflutren    Order Specific Question:   Is a special reader required? (athlete or structural heart)    Answer:   No    Order Specific Question:   Does this study need to be read by the Structural team/Level 3 readers?    Answer:   No    Order Specific Question:   Reason for exam-Echo    Answer:   Chemo  Z09    Order Specific Question:   Other Comments  Answer:   on HER2 directed therapy   The patient has a good understanding of the overall plan. she agrees with it. she will call with any problems that may develop before the next visit here.  Total time spent: 30 mins including face to face time and time spent for planning, charting and coordination of care  Rulon Eisenmenger, MD, MPH 10/05/2020  I, Thana Ates, am acting as scribe for Dr. Nicholas Lose.  I have reviewed the above documentation for accuracy and completeness, and I agree with the above.

## 2020-10-05 ENCOUNTER — Inpatient Hospital Stay: Payer: 59

## 2020-10-05 ENCOUNTER — Other Ambulatory Visit: Payer: Self-pay

## 2020-10-05 ENCOUNTER — Inpatient Hospital Stay (HOSPITAL_BASED_OUTPATIENT_CLINIC_OR_DEPARTMENT_OTHER): Payer: 59 | Admitting: Hematology and Oncology

## 2020-10-05 ENCOUNTER — Encounter: Payer: Self-pay | Admitting: *Deleted

## 2020-10-05 VITALS — BP 112/67 | HR 79 | Temp 97.7°F | Resp 18 | Ht 69.5 in | Wt 147.7 lb

## 2020-10-05 DIAGNOSIS — C50512 Malignant neoplasm of lower-outer quadrant of left female breast: Secondary | ICD-10-CM

## 2020-10-05 DIAGNOSIS — Z170 Estrogen receptor positive status [ER+]: Secondary | ICD-10-CM | POA: Diagnosis not present

## 2020-10-05 DIAGNOSIS — Z95828 Presence of other vascular implants and grafts: Secondary | ICD-10-CM

## 2020-10-05 DIAGNOSIS — Z5111 Encounter for antineoplastic chemotherapy: Secondary | ICD-10-CM | POA: Diagnosis not present

## 2020-10-05 LAB — CBC WITH DIFFERENTIAL (CANCER CENTER ONLY)
Abs Immature Granulocytes: 0.03 10*3/uL (ref 0.00–0.07)
Basophils Absolute: 0 10*3/uL (ref 0.0–0.1)
Basophils Relative: 1 %
Eosinophils Absolute: 0 10*3/uL (ref 0.0–0.5)
Eosinophils Relative: 0 %
HCT: 34.1 % — ABNORMAL LOW (ref 36.0–46.0)
Hemoglobin: 11.3 g/dL — ABNORMAL LOW (ref 12.0–15.0)
Immature Granulocytes: 0 %
Lymphocytes Relative: 28 %
Lymphs Abs: 2.2 10*3/uL (ref 0.7–4.0)
MCH: 31.6 pg (ref 26.0–34.0)
MCHC: 33.1 g/dL (ref 30.0–36.0)
MCV: 95.3 fL (ref 80.0–100.0)
Monocytes Absolute: 1 10*3/uL (ref 0.1–1.0)
Monocytes Relative: 13 %
Neutro Abs: 4.5 10*3/uL (ref 1.7–7.7)
Neutrophils Relative %: 58 %
Platelet Count: 193 10*3/uL (ref 150–400)
RBC: 3.58 MIL/uL — ABNORMAL LOW (ref 3.87–5.11)
RDW: 15.9 % — ABNORMAL HIGH (ref 11.5–15.5)
WBC Count: 7.8 10*3/uL (ref 4.0–10.5)
nRBC: 0 % (ref 0.0–0.2)

## 2020-10-05 LAB — CMP (CANCER CENTER ONLY)
ALT: 13 U/L (ref 0–44)
AST: 12 U/L — ABNORMAL LOW (ref 15–41)
Albumin: 4.2 g/dL (ref 3.5–5.0)
Alkaline Phosphatase: 83 U/L (ref 38–126)
Anion gap: 8 (ref 5–15)
BUN: 13 mg/dL (ref 6–20)
CO2: 30 mmol/L (ref 22–32)
Calcium: 9.4 mg/dL (ref 8.9–10.3)
Chloride: 105 mmol/L (ref 98–111)
Creatinine: 0.61 mg/dL (ref 0.44–1.00)
GFR, Estimated: >60 mL/min (ref >60)
Glucose, Bld: 91 mg/dL (ref 70–99)
Potassium: 3.5 mmol/L (ref 3.5–5.1)
Sodium: 143 mmol/L (ref 135–145)
Total Bilirubin: 0.5 mg/dL (ref 0.3–1.2)
Total Protein: 6.9 g/dL (ref 6.5–8.1)

## 2020-10-05 MED ORDER — SODIUM CHLORIDE 0.9 % IV SOLN
700.0000 mg | Freq: Once | INTRAVENOUS | Status: AC
  Administered 2020-10-05: 700 mg via INTRAVENOUS
  Filled 2020-10-05: qty 70

## 2020-10-05 MED ORDER — DIPHENHYDRAMINE HCL 25 MG PO CAPS
ORAL_CAPSULE | ORAL | Status: AC
  Filled 2020-10-05: qty 2

## 2020-10-05 MED ORDER — SODIUM CHLORIDE 0.9 % IV SOLN
10.0000 mg | Freq: Once | INTRAVENOUS | Status: AC
  Administered 2020-10-05: 10 mg via INTRAVENOUS
  Filled 2020-10-05: qty 10

## 2020-10-05 MED ORDER — SODIUM CHLORIDE 0.9% FLUSH
10.0000 mL | INTRAVENOUS | Status: DC | PRN
  Administered 2020-10-05: 10 mL
  Filled 2020-10-05: qty 10

## 2020-10-05 MED ORDER — SODIUM CHLORIDE 0.9 % IV SOLN
150.0000 mg | Freq: Once | INTRAVENOUS | Status: AC
  Administered 2020-10-05: 150 mg via INTRAVENOUS
  Filled 2020-10-05: qty 150

## 2020-10-05 MED ORDER — SODIUM CHLORIDE 0.9 % IV SOLN
420.0000 mg | Freq: Once | INTRAVENOUS | Status: AC
  Administered 2020-10-05: 420 mg via INTRAVENOUS
  Filled 2020-10-05: qty 14

## 2020-10-05 MED ORDER — ACETAMINOPHEN 325 MG PO TABS
ORAL_TABLET | ORAL | Status: AC
  Filled 2020-10-05: qty 2

## 2020-10-05 MED ORDER — TRASTUZUMAB-ANNS CHEMO 150 MG IV SOLR
6.0000 mg/kg | Freq: Once | INTRAVENOUS | Status: AC
  Administered 2020-10-05: 420 mg via INTRAVENOUS
  Filled 2020-10-05: qty 20

## 2020-10-05 MED ORDER — PALONOSETRON HCL INJECTION 0.25 MG/5ML
INTRAVENOUS | Status: AC
  Filled 2020-10-05: qty 5

## 2020-10-05 MED ORDER — HEPARIN SOD (PORK) LOCK FLUSH 100 UNIT/ML IV SOLN
500.0000 [IU] | Freq: Once | INTRAVENOUS | Status: AC | PRN
  Administered 2020-10-05: 500 [IU]
  Filled 2020-10-05: qty 5

## 2020-10-05 MED ORDER — PALONOSETRON HCL INJECTION 0.25 MG/5ML
0.2500 mg | Freq: Once | INTRAVENOUS | Status: AC
  Administered 2020-10-05: 0.25 mg via INTRAVENOUS

## 2020-10-05 MED ORDER — SODIUM CHLORIDE 0.9 % IV SOLN
Freq: Once | INTRAVENOUS | Status: AC
  Filled 2020-10-05: qty 250

## 2020-10-05 MED ORDER — ACETAMINOPHEN 325 MG PO TABS
650.0000 mg | ORAL_TABLET | Freq: Once | ORAL | Status: AC
  Administered 2020-10-05: 650 mg via ORAL

## 2020-10-05 MED ORDER — DIPHENHYDRAMINE HCL 25 MG PO CAPS
50.0000 mg | ORAL_CAPSULE | Freq: Once | ORAL | Status: AC
  Administered 2020-10-05: 50 mg via ORAL

## 2020-10-05 MED ORDER — SODIUM CHLORIDE 0.9 % IV SOLN
75.0000 mg/m2 | Freq: Once | INTRAVENOUS | Status: AC
  Administered 2020-10-05: 140 mg via INTRAVENOUS
  Filled 2020-10-05: qty 14

## 2020-10-05 NOTE — Patient Instructions (Signed)
[facility]  Discharge Instructions: Thank you for choosing [facility] to provide your oncology and hematology care.   If you have a lab appointment with the [facility], please go directly to the [facility] and check in at the registration area.   Wear comfortable clothing and clothing appropriate for easy access to any Portacath or PICC line.   We strive to give you quality time with your provider. You may need to reschedule your appointment if you arrive late (15 or more minutes).  Arriving late affects you and other patients whose appointments are after yours.  Also, if you miss three or more appointments without notifying the office, you may be dismissed from the clinic at the provider's discretion.      For prescription refill requests, have your pharmacy contact our office and allow 72 hours for refills to be completed.    Today you received the following chemotherapy and/or immunotherapy agents Perjeta; Herceptin; Docetaxel. And Carboplatin.      To help prevent nausea and vomiting after your treatment, we encourage you to take your nausea medication as directed.  BELOW ARE SYMPTOMS THAT SHOULD BE REPORTED IMMEDIATELY: *FEVER GREATER THAN 100.4 F (38 C) OR HIGHER *CHILLS OR SWEATING *NAUSEA AND VOMITING THAT IS NOT CONTROLLED WITH YOUR NAUSEA MEDICATION *UNUSUAL SHORTNESS OF BREATH *UNUSUAL BRUISING OR BLEEDING *URINARY PROBLEMS (pain or burning when urinating, or frequent urination) *BOWEL PROBLEMS (unusual diarrhea, constipation, pain near the anus) TENDERNESS IN MOUTH AND THROAT WITH OR WITHOUT PRESENCE OF ULCERS (sore throat, sores in mouth, or a toothache) UNUSUAL RASH, SWELLING OR PAIN  UNUSUAL VAGINAL DISCHARGE OR ITCHING   Items with * indicate a potential emergency and should be followed up as soon as possible or go to the Emergency Department if any problems should occur.  Please show the CHEMOTHERAPY ALERT CARD or  IMMUNOTHERAPY ALERT CARD at check-in to the Emergency Department and triage nurse.  Should you have questions after your visit or need to cancel or reschedule your appointment, please contact [facility]  Dept: [phone]  and follow the prompts.  Office hours are 8:00 a.m. to 4:30 p.m. Monday - Friday. Please note that voicemails left after 4:00 p.m. may not be returned until the following business day.  We are closed weekends and major holidays. You have access to a nurse at all times for urgent questions. Please call the main number to the clinic Dept: [phone] and follow the prompts.   For any non-urgent questions, you may also contact your provider using MyChart. We now offer e-Visits for anyone 46 and older to request care online for non-urgent symptoms. For details visit [email or web address].   Also download the MyChart app! Go to the app store, search "MyChart", open the app, select [facility], and log in with your MyChart username and password.  Due to Covid, a mask is required upon entering the hospital/clinic. If you do not have a mask, one will be given to you upon arrival. For doctor visits, patients may have 1 support person aged 76 or older with them. For treatment visits, patients cannot have anyone with them due to current Covid guidelines and our immunocompromised population.

## 2020-10-05 NOTE — Progress Notes (Signed)
Per Dr. Lindi Adie ok to precede without current echo.  Pt to get prior to next treatment.  Pt has an appt 7/28; pt is aware.

## 2020-10-05 NOTE — Assessment & Plan Note (Signed)
06/23/2020:Normal mammogram September 2021. December 2021: Following trauma to the breast she felt a lump but for variety of reasons work-up was postponed. 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  Treatment Plan: 1. Neoadjuvant chemotherapy with TCH Perjeta 6 cycles followed by Herceptin Perjeta maintenance versus Kadcyla maintenance (based on response to neoadjuvant chemo) for 1 year 2. Followed by breast conserving surgery if possible withaxillary lymph node dissection 3. Followed by adjuvant radiation therapy 4.Followed by antiestrogen therapy 5.Followed by neratinib MRI liver 07/11/20: Hepatic cysts Bone scan and CT CAP: No mets Breast MRI 06/28/2020: 2 large left breast masses each 0.5 cm, overall 7 cm, third contiguous mass 3 cm (could be a lymph node, abutting pectoralis muscle), 4 lymph nodes left axilla ---------------------------------------------------------------------------------------------------------------------------- Current Treatment:Cycle5TCHP ECHO: 07/11/20: EF 60-65% Labs reviewed  Chemo Toxicities: Denies any nausea or vomiting. 1.Diarrhea: Occasionally 3-4 loose stools per day. Imodium is helping her 2.Fatigue day 4 today 6 3.Alopecia 4.Chemotherapy induced anemia: Monitoring hemoglobin 11.4  Monitoring blood counts. Breast MRI ordered Patient has appointment to see Dr. Donne Hazel.  RTC in3weeks for cycle6

## 2020-10-07 ENCOUNTER — Inpatient Hospital Stay: Payer: 59

## 2020-10-07 ENCOUNTER — Other Ambulatory Visit: Payer: Self-pay

## 2020-10-07 VITALS — BP 106/70 | HR 75 | Temp 98.2°F | Resp 18

## 2020-10-07 DIAGNOSIS — C50512 Malignant neoplasm of lower-outer quadrant of left female breast: Secondary | ICD-10-CM

## 2020-10-07 DIAGNOSIS — Z170 Estrogen receptor positive status [ER+]: Secondary | ICD-10-CM

## 2020-10-07 DIAGNOSIS — Z5111 Encounter for antineoplastic chemotherapy: Secondary | ICD-10-CM | POA: Diagnosis not present

## 2020-10-07 MED ORDER — PEGFILGRASTIM-BMEZ 6 MG/0.6ML ~~LOC~~ SOSY
PREFILLED_SYRINGE | SUBCUTANEOUS | Status: AC
  Filled 2020-10-07: qty 0.6

## 2020-10-07 MED ORDER — PEGFILGRASTIM-BMEZ 6 MG/0.6ML ~~LOC~~ SOSY
6.0000 mg | PREFILLED_SYRINGE | Freq: Once | SUBCUTANEOUS | Status: AC
Start: 2020-10-07 — End: 2020-10-07
  Administered 2020-10-07: 6 mg via SUBCUTANEOUS

## 2020-10-13 ENCOUNTER — Ambulatory Visit (HOSPITAL_COMMUNITY)
Admission: RE | Admit: 2020-10-13 | Discharge: 2020-10-13 | Disposition: A | Discharge status: Home / Self Care | Payer: 59 | Source: Ambulatory Visit | Attending: Hematology and Oncology | Admitting: Hematology and Oncology

## 2020-10-13 ENCOUNTER — Other Ambulatory Visit: Payer: Self-pay

## 2020-10-13 DIAGNOSIS — C50512 Malignant neoplasm of lower-outer quadrant of left female breast: Secondary | ICD-10-CM

## 2020-10-13 DIAGNOSIS — Z01810 Encounter for preprocedural cardiovascular examination: Secondary | ICD-10-CM | POA: Insufficient documentation

## 2020-10-13 DIAGNOSIS — Z170 Estrogen receptor positive status [ER+]: Secondary | ICD-10-CM

## 2020-10-13 DIAGNOSIS — Z0189 Encounter for other specified special examinations: Secondary | ICD-10-CM

## 2020-10-13 LAB — ECHOCARDIOGRAM COMPLETE
Area-P 1/2: 5.97 cm2
S' Lateral: 2.4 cm

## 2020-10-13 NOTE — Progress Notes (Signed)
  Echocardiogram 2D Echocardiogram has been performed.  Janet [name] 10/13/2020, 9:44 AM

## 2020-10-14 ENCOUNTER — Telehealth: Payer: Self-pay

## 2020-10-14 ENCOUNTER — Other Ambulatory Visit: Payer: Self-pay

## 2020-10-14 DIAGNOSIS — R931 Abnormal findings on diagnostic imaging of heart and coronary circulation: Secondary | ICD-10-CM

## 2020-10-14 DIAGNOSIS — Z170 Estrogen receptor positive status [ER+]: Secondary | ICD-10-CM

## 2020-10-14 DIAGNOSIS — C50512 Malignant neoplasm of lower-outer quadrant of left female breast: Secondary | ICD-10-CM

## 2020-10-14 NOTE — Telephone Encounter (Signed)
-----   Message from Gardenia Phlegm, NP sent at 10/13/2020  8:56 PM EDT ----- Slight change in echo strain which can be an early sign that the heart function is getting ready to decline.  Heart function is normal right now, but, I would recommend her seeing cardiology to be on the safe side.  Please place a referral to [email or web address].  Thanks.   ----- Message ----- From: Interface, [employer] Sent: 10/13/2020  10:53 AM EDT To: Nicholas Lose, MD

## 2020-10-14 NOTE — Telephone Encounter (Signed)
Patient notified, verbalized understanding.   Referral placed

## 2020-10-14 NOTE — Progress Notes (Signed)
Referral placed for cardiologist.

## 2020-10-18 ENCOUNTER — Telehealth: Payer: Self-pay | Admitting: *Deleted

## 2020-10-18 NOTE — Telephone Encounter (Signed)
Spoke to pt regarding cardiology referral to [facility]. Informed referral has been placed and msg sent requesting urgent appt prior to C6 TCHP on 8/10. Received verbal understanding. No further needs voiced.

## 2020-10-21 ENCOUNTER — Other Ambulatory Visit: Payer: Self-pay

## 2020-10-21 ENCOUNTER — Encounter (HOSPITAL_COMMUNITY): Payer: Self-pay | Admitting: Cardiology

## 2020-10-21 ENCOUNTER — Ambulatory Visit (HOSPITAL_COMMUNITY)
Admission: RE | Admit: 2020-10-21 | Discharge: 2020-10-21 | Disposition: A | Discharge status: Home / Self Care | Payer: 59 | Source: Ambulatory Visit | Attending: Internal Medicine | Admitting: Internal Medicine

## 2020-10-21 ENCOUNTER — Encounter (HOSPITAL_COMMUNITY): Payer: Self-pay | Admitting: Internal Medicine

## 2020-10-21 VITALS — BP 106/78 | HR 82 | Wt 145.6 lb

## 2020-10-21 DIAGNOSIS — Z79899 Other long term (current) drug therapy: Secondary | ICD-10-CM | POA: Insufficient documentation

## 2020-10-21 DIAGNOSIS — C50912 Malignant neoplasm of unspecified site of left female breast: Secondary | ICD-10-CM | POA: Diagnosis not present

## 2020-10-21 DIAGNOSIS — C50512 Malignant neoplasm of lower-outer quadrant of left female breast: Secondary | ICD-10-CM | POA: Diagnosis not present

## 2020-10-21 DIAGNOSIS — Z87891 Personal history of nicotine dependence: Secondary | ICD-10-CM | POA: Diagnosis not present

## 2020-10-21 DIAGNOSIS — Z170 Estrogen receptor positive status [ER+]: Secondary | ICD-10-CM | POA: Insufficient documentation

## 2020-10-21 NOTE — Patient Instructions (Signed)
The below information/AVS was sent via My Chart:  It was great to see you today! No medication changes are needed at this time.  Your physician recommends that you schedule a follow-up appointment in: 3 months with Dr Haroldine Laws and echo  Your physician has requested that you have an echocardiogram. Echocardiography is a painless test that uses sound waves to create images of your heart. It provides your doctor with information about the size and shape of your heart and how well your heart's chambers and valves are working. This procedure takes approximately one hour. There are no restrictions for this procedure.   At the [facility], you and your health needs are our priority. As part of our continuing mission to provide you with exceptional heart care, we have created designated Provider Care Teams. These Care Teams include your primary Cardiologist (physician) and Advanced Practice Providers (APPs- Physician Assistants and Nurse Practitioners) who all work together to provide you with the care you need, when you need it.   You may see any of the following providers on your designated Care Team at your next follow up: Dr Glori Bickers Dr Loralie Champagne Dr Patrice Paradise, NP Lyda Jester, [place] Ginnie Smart Audry Riles, PharmD   Please be sure to bring in all your medications bottles to every appointment.

## 2020-10-21 NOTE — Progress Notes (Signed)
CARDIO-ONCOLOGY CLINIC CONSULT NOTE  Referring Physician:Gudena, [facility] Primary Care: Charlynn Court Primary Cardiologist: Glori Bickers  HPI: Janet [name] is 55 y.o. female with PMH of mild MR, stage IIB left breast cancer referred by Dr. Nicholas Lose for enrollment into the Cardio-Oncology program.  Clinical stage from 06/28/2020: Left breast cancer Stage IIB (cT2, cN1, cM0, G3, ER+, PR-, HER2+).  Started on chemo DOCETAXEL + CARBOPLATIN + TRASTUZUMAB + PERTUZUMAB  (TCHP) Q21D.  Last infusion 10/05/20.  Last infusion of TCHP scheduled for August 10th. After that Herceptin Perjeta maintenance versus Kadcyla maintenance (based on response to neoadjuvant chemo) for 1 year. Mastectomy will likely take place around September.  Adjuvant radiation to begin after chemo. Then antiestrogen therapy followed by neratinib.    EF 2017 60-65%  EF 06/2020 60-65%, GLS -19.3% ECHO 10/13/2020 EF 60-65%, GLS -15% but poor tracking, trivial MR  Feeling well lately.  No shortness of breath.  Taking it easy, going for walks about a week after chemo without issue.    Review of Systems: [y] = yes, [ ] = no   General: Weight gain [ ]; Weight loss [ ]; Anorexia [ ]; Fatigue []; Fever [ ]; Chills [ ]; Weakness [ ]  Cardiac: Chest pain/pressure [ ]; Resting SOB [ ]; Exertional SOB [ ]; Orthopnea [ ]; Pedal Edema [ ]; Palpitations [ ]; Syncope [ ]; Presyncope [ ]; Paroxysmal nocturnal dyspnea[ ]  Pulmonary: Cough [ ]; Wheezing[ ]; Hemoptysis[ ]; Sputum [ ]; Snoring [ ]  GI: Vomiting[ ]; Dysphagia[ ]; Melena[ ]; Hematochezia [ ]; Heartburn[ ]; Abdominal pain [ ]; Constipation [ ]; Diarrhea [ ]; BRBPR [ ]  GU: Hematuria[ ]; Dysuria [ ]; Nocturia[ ]  Vascular: Pain in legs with walking [ ]; Pain in feet with lying flat [ ]; Non-healing sores [ ]; Stroke [ ]; TIA [ ]; Slurred speech [ ];  Neuro: Headaches[ ]; Vertigo[ ]; Seizures[ ]; Paresthesias[ ];Blurred vision [ ]; Diplopia [ ]; Vision changes [ ]   Ortho/Skin: Arthritis [ ]; Joint pain [ ]; Muscle pain [ ]; Joint swelling [ ]; Back Pain [ ]; Rash [ ]  Psych: Depression[ ]; Anxiety[ ]  Heme: Bleeding problems [ ]; Clotting disorders [ ]; Anemia [ ]  Endocrine: Diabetes [ ]; Thyroid dysfunction[ ]   Past Medical History:  Diagnosis Date   Anemia    Fibrocystic breast disease    Mitral regurgitation    Mild by echo 09/17/11.   Varicose vein of leg     Current Outpatient Medications  Medication Sig Dispense Refill   azithromycin (ZITHROMAX) 250 MG tablet Take as directed 6 each 0   dexamethasone (DECADRON) 4 MG tablet Take 1 tablet (4 mg total) by mouth daily. Take 1 tablet day before chemo and 1 tablet day after chemo with food 12 tablet 0   fluconazole (DIFLUCAN) 200 MG tablet Take 1 tablet (200 mg total) by mouth daily. 1 tablet 0   lidocaine-prilocaine (EMLA) cream Apply to affected area once 30 g 3   nystatin (MYCOSTATIN) 100000 UNIT/ML suspension Take 5 mLs (500,000 Units total) by mouth 4 (four) times daily. 60 mL 1   ondansetron (ZOFRAN) 8 MG tablet Take 1 tablet (8 mg total) by mouth 2 (two) times daily as needed (Nausea or vomiting). Start on the third day after chemotherapy. 30 tablet 1   prochlorperazine (COMPAZINE) 10 MG tablet Take 1 tablet (10 mg total) by mouth every 6 (six) hours  as needed (Nausea or vomiting). 30 tablet 1   No current facility-administered medications for this visit.    No Known Allergies    Social History   Socioeconomic History   Marital status: Married    Spouse name: Not on file   Number of children: 2   Years of education: Not on file   Highest education level: Not on file  Occupational History   Occupation: Biochemist, clinical  Tobacco Use   Smoking status: Former    Packs/day: 1.00    Years: 20.00    Pack years: 20.00    Types: Cigarettes    Quit date: 03/19/2002    Years since quitting: 18.6   Smokeless tobacco: Never  Vaping Use   Vaping Use: Never used  Substance and  Sexual Activity   Alcohol use: No   Drug use: No   Sexual activity: Yes    Birth control/protection: None  Other Topics Concern   Not on file  Social History Narrative   Not on file   Social Determinants of Health   Financial Resource Strain: Not on file  Food Insecurity: Not on file  Transportation Needs: Not on file  Physical Activity: Not on file  Stress: Not on file  Social Connections: Not on file  Intimate Partner Violence: Not on file      Family History  Problem Relation Age of Onset   Cancer Father        Lung or esphageal   Hypertension Mother    Diabetes Mother    Coronary artery disease Paternal Uncle     There were no vitals filed for this visit.  PHYSICAL EXAM: General:  Well appearing. No respiratory difficulty HEENT: normal Neck: supple. no JVD. Carotids 2+ bilat; no bruits. No lymphadenopathy or thryomegaly appreciated. Cor: PMI nondisplaced. Regular rate & rhythm. No rubs, gallops or murmurs. Lungs: clear Abdomen: soft, nontender, nondistended. No hepatosplenomegaly. No bruits or masses. Good bowel sounds. Extremities: no cyanosis, clubbing, rash, edema Neuro: alert & oriented x 3, cranial nerves grossly intact. moves all 4 extremities w/o difficulty. Affect pleasant.  ECG: NSR rate 77  ASSESSMENT & PLAN: 1. Stage IIB left  Breast Cancer -started on chemotherapy regimen outlined above on 06/2020  -ECHO 10/13/2020 EF 60-65%, GLS -15% but poor tracking, trivial MR.  Personally reviewed  -no signs of chemotherapy induced toxicity, continue serial surveillance ECHO monitoring q3 months x 1 year  Katherine Roan, MD  9:37 AM  Patient seen and examined with the above-signed Advanced Practice Provider and/or Housestaff. I personally reviewed laboratory data, imaging studies and relevant notes. I independently examined the patient and formulated the important aspects of the plan. I have edited the note to reflect any of my changes or salient points. I  have personally discussed the plan with the patient and/or family.  55 y/o woman with recently diagnosed breast CA referred for enrollment into cardio-oncology clinic  Doing well. Echos stable  General:  Well appearing. No resp difficulty HEENT: normal Neck: supple. no JVD. Carotids 2+ bilat; no bruits. No lymphadenopathy or thryomegaly appreciated. R port  Cor: PMI nondisplaced. Regular rate & rhythm. No rubs, gallops or murmurs. Lungs: clear Abdomen: soft, nontender, nondistended. No hepatosplenomegaly. No bruits or masses. Good bowel sounds. Extremities: no cyanosis, clubbing, rash, edema Neuro: alert & orientedx3, cranial nerves grossly intact. moves all 4 extremities w/o difficulty. Affect pleasant  Explained incidence of Herceptin cardiotoxicity and role of Cardio-oncology clinic at length. Echo images reviewed personally. All parameters  stable. Reviewed signs and symptoms of HF to look for. Continue Herceptin. Follow-up with echo in 3 months.  Glori Bickers, MD  1:36 PM

## 2020-10-25 NOTE — Progress Notes (Signed)
Patient Care Team: Charlynn Court, NP as PCP - General (Nurse Practitioner) Rockwell Germany, RN as Oncology Nurse Navigator Mauro Kaufmann, RN as Oncology Nurse Navigator  DIAGNOSIS:    ICD-10-CM   1. Malignant neoplasm of lower-outer quadrant of left breast of female, estrogen receptor positive ([place])  C50.512    Z17.0       SUMMARY OF ONCOLOGIC HISTORY: Oncology History  Malignant neoplasm of lower-outer quadrant of left breast of female, estrogen receptor positive ([place])  06/23/2020 Initial Diagnosis   Normal mammogram September 2021.  December 2021: Following trauma to the breast she felt a lump but for variety of reasons work-up was postponed.  Mammogram revealed 2 tumors measuring 7 cm individually 4 cm and 3 cm, 3 axillary lymph nodes, biopsy revealed grade 3 IDC ER 10%, PR 0%, Ki-67 40%, HER-2 +3+ by IHC, lymph node also positive   06/28/2020 Cancer Staging   Staging form: Breast, AJCC 8th Edition - Clinical stage from 06/28/2020: Stage IIB (cT2, cN1, cM0, G3, ER+, PR-, HER2+) - Signed by Nicholas Lose, MD on 06/28/2020  Stage prefix: Initial diagnosis  Histologic grading system: 3 grade system    07/13/2020 -  Chemotherapy    Patient is on Treatment Plan: BREAST  DOCETAXEL + CARBOPLATIN + TRASTUZUMAB + PERTUZUMAB  (TCHP) Q21D          CHIEF COMPLIANT: Cycle 6 TCH Perjeta  INTERVAL HISTORY: Janet [name] is a 55 y.o. with above-mentioned history of HER-2 positive breast cancer who is currently on neoadjuvant chemotherapy with [place]. She presents to the clinic today for cycle 6.  She has a weird area of numbness on the dorsum of her left foot as well as in the shin area.  She does not have any neuropathy of the bottom of her feet or fingers or toes.  She denies any nausea or vomiting.  ALLERGIES:  has No Known Allergies.  MEDICATIONS:  Current Outpatient Medications  Medication Sig Dispense Refill   dexamethasone (DECADRON) 4 MG tablet Take 1 tablet (4 mg  total) by mouth daily. Take 1 tablet day before chemo and 1 tablet day after chemo with food 12 tablet 0   fluconazole (DIFLUCAN) 200 MG tablet Take 1 tablet (200 mg total) by mouth daily. (Patient taking differently: Take 200 mg by mouth daily. As needed) 1 tablet 0   lidocaine-prilocaine (EMLA) cream Apply to affected area once 30 g 3   ondansetron (ZOFRAN) 8 MG tablet Take 1 tablet (8 mg total) by mouth 2 (two) times daily as needed (Nausea or vomiting). Start on the third day after chemotherapy. 30 tablet 1   prochlorperazine (COMPAZINE) 10 MG tablet Take 1 tablet (10 mg total) by mouth every 6 (six) hours as needed (Nausea or vomiting). 30 tablet 1   No current facility-administered medications for this visit.    PHYSICAL EXAMINATION: ECOG PERFORMANCE STATUS: 1 - Symptomatic but completely ambulatory  Vitals:   10/26/20 1109  BP: 112/73  Pulse: 76  Resp: 18  Temp: (!) 97.5 F (36.4 C)  SpO2: 100%   Filed Weights   10/26/20 1109  Weight: 146 lb 1.6 oz (66.3 kg)    LABORATORY DATA:  I have reviewed the data as listed CMP Latest Ref Rng & Units 10/05/2020 09/14/2020 08/24/2020  Glucose 70 - 99 mg/dL 91 86 91  BUN 6 - 20 mg/dL $Remove'13 13 13  'jqZZaSo$ Creatinine 0.44 - 1.00 mg/dL 0.61 0.64 0.67  Sodium 135 - 145 mmol/L 143  141 142  Potassium 3.5 - 5.1 mmol/L 3.5 3.4(L) 3.6  Chloride 98 - 111 mmol/L 105 108 106  CO2 22 - 32 mmol/L $RemoveB'30 26 27  'rhWdFbFr$ Calcium 8.9 - 10.3 mg/dL 9.4 9.0 9.4  Total Protein 6.5 - 8.1 g/dL 6.9 6.7 7.0  Total Bilirubin 0.3 - 1.2 mg/dL 0.5 0.3 0.3  Alkaline Phos 38 - 126 U/L 83 96 99  AST 15 - 41 U/L 12(L) 11(L) 11(L)  ALT 0 - 44 U/L $Remo'13 11 10    'hbUfp$ Lab Results  Component Value Date   WBC 6.4 10/26/2020   HGB 11.0 (L) 10/26/2020   HCT 33.3 (L) 10/26/2020   MCV 98.2 10/26/2020   PLT 110 (L) 10/26/2020   NEUTROABS 3.6 10/26/2020    ASSESSMENT & PLAN:  Malignant neoplasm of lower-outer quadrant of left breast of female, estrogen receptor positive ([place]) 06/23/2020: Normal  mammogram September 2021.  December 2021: Following trauma to the breast she felt a lump but for variety of reasons work-up was postponed.  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   Treatment Plan: 1. Neoadjuvant chemotherapy with TCH Perjeta 6 cycles followed by Herceptin Perjeta maintenance versus Kadcyla maintenance (based on response to neoadjuvant chemo) for 1 year 2. Followed by breast conserving surgery if possible with axillary lymph node dissection 3. Followed by adjuvant radiation therapy 4.  Followed by antiestrogen therapy 5.  Followed by neratinib MRI liver 07/11/20: Hepatic cysts Bone scan and CT CAP: No mets Breast MRI 06/28/2020: 2 large left breast masses each 0.5 cm, overall 7 cm, third contiguous mass 3 cm (could be a lymph node, abutting pectoralis muscle), 4 lymph nodes left axilla ---------------------------------------------------------------------------------------------------------------------------- Current Treatment: Cycle 6 TCHP ECHO: 07/11/20: EF 60-65% Labs reviewed   Chemo Toxicities: Denies any nausea or vomiting. 1.   Diarrhea: Occasionally 3-4 loose stools per day.  Imodium is helping her 2. Fatigue day 4 to 6 3.  Alopecia 4.  Chemotherapy induced anemia: Monitoring hemoglobin 11.4   Monitoring blood counts. Breast MRI 10/27/2020 Patient has appointment to see Dr. Donne Hazel.  Herceptin Perjeta in 3 weeks. Return to clinic after surgery to discuss final pathology and determine if Herceptin Perjeta maintenance versus Kadcyla maintenance is warranted.    No orders of the defined types were placed in this encounter.  The patient has a good understanding of the overall plan. she agrees with it. she will call with any problems that may develop before the next visit here.  Total time spent: 30 mins  including face to face time and time spent for planning, charting and coordination of care  Rulon Eisenmenger, MD, MPH 10/26/2020  I, Thana Ates, am acting as scribe for Dr. Nicholas Lose.  I have reviewed the above documentation for accuracy and completeness, and I agree with the above.

## 2020-10-26 ENCOUNTER — Inpatient Hospital Stay (HOSPITAL_BASED_OUTPATIENT_CLINIC_OR_DEPARTMENT_OTHER): Payer: 59 | Admitting: Hematology and Oncology

## 2020-10-26 ENCOUNTER — Inpatient Hospital Stay: Payer: 59

## 2020-10-26 ENCOUNTER — Inpatient Hospital Stay: Payer: 59 | Attending: Hematology and Oncology

## 2020-10-26 ENCOUNTER — Other Ambulatory Visit: Payer: Self-pay

## 2020-10-26 ENCOUNTER — Encounter: Payer: Self-pay | Admitting: *Deleted

## 2020-10-26 DIAGNOSIS — Z5111 Encounter for antineoplastic chemotherapy: Secondary | ICD-10-CM | POA: Diagnosis present

## 2020-10-26 DIAGNOSIS — Z170 Estrogen receptor positive status [ER+]: Secondary | ICD-10-CM | POA: Diagnosis not present

## 2020-10-26 DIAGNOSIS — C50512 Malignant neoplasm of lower-outer quadrant of left female breast: Secondary | ICD-10-CM

## 2020-10-26 DIAGNOSIS — Z79899 Other long term (current) drug therapy: Secondary | ICD-10-CM | POA: Diagnosis not present

## 2020-10-26 DIAGNOSIS — Z95828 Presence of other vascular implants and grafts: Secondary | ICD-10-CM

## 2020-10-26 DIAGNOSIS — Z5189 Encounter for other specified aftercare: Secondary | ICD-10-CM | POA: Insufficient documentation

## 2020-10-26 LAB — CMP (CANCER CENTER ONLY)
ALT: 10 U/L (ref 0–44)
AST: 11 U/L — ABNORMAL LOW (ref 15–41)
Albumin: 3.8 g/dL (ref 3.5–5.0)
Alkaline Phosphatase: 85 U/L (ref 38–126)
Anion gap: 8 (ref 5–15)
BUN: 15 mg/dL (ref 6–20)
CO2: 25 mmol/L (ref 22–32)
Calcium: 9.4 mg/dL (ref 8.9–10.3)
Chloride: 108 mmol/L (ref 98–111)
Creatinine: 0.67 mg/dL (ref 0.44–1.00)
GFR, Estimated: >60 mL/min (ref >60)
Glucose, Bld: 91 mg/dL (ref 70–99)
Potassium: 3.5 mmol/L (ref 3.5–5.1)
Sodium: 141 mmol/L (ref 135–145)
Total Bilirubin: 0.5 mg/dL (ref 0.3–1.2)
Total Protein: 6.6 g/dL (ref 6.5–8.1)

## 2020-10-26 LAB — CBC WITH DIFFERENTIAL (CANCER CENTER ONLY)
Abs Immature Granulocytes: 0.01 10*3/uL (ref 0.00–0.07)
Basophils Absolute: 0 10*3/uL (ref 0.0–0.1)
Basophils Relative: 1 %
Eosinophils Absolute: 0 10*3/uL (ref 0.0–0.5)
Eosinophils Relative: 0 %
HCT: 33.3 % — ABNORMAL LOW (ref 36.0–46.0)
Hemoglobin: 11 g/dL — ABNORMAL LOW (ref 12.0–15.0)
Immature Granulocytes: 0 %
Lymphocytes Relative: 33 %
Lymphs Abs: 2.1 10*3/uL (ref 0.7–4.0)
MCH: 32.4 pg (ref 26.0–34.0)
MCHC: 33 g/dL (ref 30.0–36.0)
MCV: 98.2 fL (ref 80.0–100.0)
Monocytes Absolute: 0.6 10*3/uL (ref 0.1–1.0)
Monocytes Relative: 10 %
Neutro Abs: 3.6 10*3/uL (ref 1.7–7.7)
Neutrophils Relative %: 56 %
Platelet Count: 110 10*3/uL — ABNORMAL LOW (ref 150–400)
RBC: 3.39 MIL/uL — ABNORMAL LOW (ref 3.87–5.11)
RDW: 15.9 % — ABNORMAL HIGH (ref 11.5–15.5)
WBC Count: 6.4 10*3/uL (ref 4.0–10.5)
nRBC: 0 % (ref 0.0–0.2)

## 2020-10-26 MED ORDER — DIPHENHYDRAMINE HCL 25 MG PO CAPS
ORAL_CAPSULE | ORAL | Status: AC
  Filled 2020-10-26: qty 2

## 2020-10-26 MED ORDER — SODIUM CHLORIDE 0.9 % IV SOLN
75.0000 mg/m2 | Freq: Once | INTRAVENOUS | Status: AC
  Administered 2020-10-26: 140 mg via INTRAVENOUS
  Filled 2020-10-26: qty 14

## 2020-10-26 MED ORDER — SODIUM CHLORIDE 0.9 % IV SOLN
700.0000 mg | Freq: Once | INTRAVENOUS | Status: AC
  Administered 2020-10-26: 700 mg via INTRAVENOUS
  Filled 2020-10-26: qty 70

## 2020-10-26 MED ORDER — SODIUM CHLORIDE 0.9% FLUSH
10.0000 mL | INTRAVENOUS | Status: DC | PRN
  Administered 2020-10-26: 10 mL
  Filled 2020-10-26: qty 10

## 2020-10-26 MED ORDER — PALONOSETRON HCL INJECTION 0.25 MG/5ML
0.2500 mg | Freq: Once | INTRAVENOUS | Status: AC
  Administered 2020-10-26: 0.25 mg via INTRAVENOUS

## 2020-10-26 MED ORDER — PALONOSETRON HCL INJECTION 0.25 MG/5ML
INTRAVENOUS | Status: AC
  Filled 2020-10-26: qty 5

## 2020-10-26 MED ORDER — SODIUM CHLORIDE 0.9 % IV SOLN
Freq: Once | INTRAVENOUS | Status: AC
Start: 2020-10-26 — End: 2020-10-26
  Filled 2020-10-26: qty 250

## 2020-10-26 MED ORDER — DIPHENHYDRAMINE HCL 25 MG PO CAPS
50.0000 mg | ORAL_CAPSULE | Freq: Once | ORAL | Status: AC
  Administered 2020-10-26: 50 mg via ORAL

## 2020-10-26 MED ORDER — TRASTUZUMAB-ANNS CHEMO 150 MG IV SOLR
6.0000 mg/kg | Freq: Once | INTRAVENOUS | Status: AC
  Administered 2020-10-26: 420 mg via INTRAVENOUS
  Filled 2020-10-26: qty 20

## 2020-10-26 MED ORDER — SODIUM CHLORIDE 0.9 % IV SOLN
420.0000 mg | Freq: Once | INTRAVENOUS | Status: AC
  Administered 2020-10-26: 420 mg via INTRAVENOUS
  Filled 2020-10-26: qty 14

## 2020-10-26 MED ORDER — SODIUM CHLORIDE 0.9 % IV SOLN
150.0000 mg | Freq: Once | INTRAVENOUS | Status: AC
  Administered 2020-10-26: 150 mg via INTRAVENOUS
  Filled 2020-10-26: qty 150

## 2020-10-26 MED ORDER — DEXAMETHASONE SODIUM PHOSPHATE 100 MG/10ML IJ SOLN
10.0000 mg | Freq: Once | INTRAMUSCULAR | Status: AC
  Administered 2020-10-26: 10 mg via INTRAVENOUS
  Filled 2020-10-26: qty 10

## 2020-10-26 MED ORDER — HEPARIN SOD (PORK) LOCK FLUSH 100 UNIT/ML IV SOLN
500.0000 [IU] | Freq: Once | INTRAVENOUS | Status: AC | PRN
  Administered 2020-10-26: 500 [IU]
  Filled 2020-10-26: qty 5

## 2020-10-26 MED ORDER — ACETAMINOPHEN 325 MG PO TABS
650.0000 mg | ORAL_TABLET | Freq: Once | ORAL | Status: AC
Start: 2020-10-26 — End: 2020-10-26
  Administered 2020-10-26: 650 mg via ORAL

## 2020-10-26 MED ORDER — ACETAMINOPHEN 325 MG PO TABS
ORAL_TABLET | ORAL | Status: AC
  Filled 2020-10-26: qty 2

## 2020-10-26 MED ORDER — SODIUM CHLORIDE 0.9% FLUSH
10.0000 mL | INTRAVENOUS | Status: DC | PRN
Start: 2020-10-26 — End: 2020-10-26
  Administered 2020-10-26: 10 mL
  Filled 2020-10-26: qty 10

## 2020-10-26 NOTE — Patient Instructions (Signed)
[facility]  Discharge Instructions: Thank you for choosing [facility] to provide your oncology and hematology care.   If you have a lab appointment with the [facility], please go directly to the [facility] and check in at the registration area.   Wear comfortable clothing and clothing appropriate for easy access to any Portacath or PICC line.   We strive to give you quality time with your provider. You may need to reschedule your appointment if you arrive late (15 or more minutes).  Arriving late affects you and other patients whose appointments are after yours.  Also, if you miss three or more appointments without notifying the office, you may be dismissed from the clinic at the provider's discretion.      For prescription refill requests, have your pharmacy contact our office and allow 72 hours for refills to be completed.    Today you received the following chemotherapy and/or immunotherapy agents Kanjinti, Perjeta, Taxotere, Carboplatin    To help prevent nausea and vomiting after your treatment, we encourage you to take your nausea medication as directed.  BELOW ARE SYMPTOMS THAT SHOULD BE REPORTED IMMEDIATELY: *FEVER GREATER THAN 100.4 F (38 C) OR HIGHER *CHILLS OR SWEATING *NAUSEA AND VOMITING THAT IS NOT CONTROLLED WITH YOUR NAUSEA MEDICATION *UNUSUAL SHORTNESS OF BREATH *UNUSUAL BRUISING OR BLEEDING *URINARY PROBLEMS (pain or burning when urinating, or frequent urination) *BOWEL PROBLEMS (unusual diarrhea, constipation, pain near the anus) TENDERNESS IN MOUTH AND THROAT WITH OR WITHOUT PRESENCE OF ULCERS (sore throat, sores in mouth, or a toothache) UNUSUAL RASH, SWELLING OR PAIN  UNUSUAL VAGINAL DISCHARGE OR ITCHING   Items with * indicate a potential emergency and should be followed up as soon as possible or go to the Emergency Department if any problems should occur.  Please show the CHEMOTHERAPY ALERT CARD or  IMMUNOTHERAPY ALERT CARD at check-in to the Emergency Department and triage nurse.  Should you have questions after your visit or need to cancel or reschedule your appointment, please contact [facility]  Dept: [phone]  and follow the prompts.  Office hours are 8:00 a.m. to 4:30 p.m. Monday - Friday. Please note that voicemails left after 4:00 p.m. may not be returned until the following business day.  We are closed weekends and major holidays. You have access to a nurse at all times for urgent questions. Please call the main number to the clinic Dept: [phone] and follow the prompts.   For any non-urgent questions, you may also contact your provider using MyChart. We now offer e-Visits for anyone 40 and older to request care online for non-urgent symptoms. For details visit [email or web address].   Also download the MyChart app! Go to the app store, search "MyChart", open the app, select [facility], and log in with your MyChart username and password.  Due to Covid, a mask is required upon entering the hospital/clinic. If you do not have a mask, one will be given to you upon arrival. For doctor visits, patients may have 1 support person aged 35 or older with them. For treatment visits, patients cannot have anyone with them due to current Covid guidelines and our immunocompromised population.   Trastuzumab injection for infusion What is this medicine? TRASTUZUMAB (tras TOO zoo mab) is a monoclonal antibody. It is used to treat breast cancer and stomach cancer. This medicine may be used for other purposes; ask your health care provider or pharmacist if you have questions. COMMON BRAND  NAME(S): Herceptin, Galvin Proffer, Trazimera What should I tell my health care provider before I take this medicine? They need to know if you have any of these conditions: heart disease heart failure lung or breathing disease, like asthma an unusual or  allergic reaction to trastuzumab, benzyl alcohol, or other medications, foods, dyes, or preservatives pregnant or trying to get pregnant breast-feeding How should I use this medicine? This drug is given as an infusion into a vein. It is administered in a hospital or clinic by a specially trained health care professional. Talk to your pediatrician regarding the use of this medicine in children. This medicine is not approved for use in children. Overdosage: If you think you have taken too much of this medicine contact a poison control center or emergency room at once. NOTE: This medicine is only for you. Do not share this medicine with others. What if I miss a dose? It is important not to miss a dose. Call your doctor or health care professional if you are unable to keep an appointment. What may interact with this medicine? This medicine may interact with the following medications: certain types of chemotherapy, such as daunorubicin, doxorubicin, epirubicin, and idarubicin This list may not describe all possible interactions. Give your health care provider a list of all the medicines, herbs, non-prescription drugs, or dietary supplements you use. Also tell them if you smoke, drink alcohol, or use illegal drugs. Some items may interact with your medicine. What should I watch for while using this medicine? Visit your doctor for checks on your progress. Report any side effects. Continue your course of treatment even though you feel ill unless your doctor tells you to stop. Call your doctor or health care professional for advice if you get a fever, chills or sore throat, or other symptoms of a cold or flu. Do not treat yourself. Try to avoid being around people who are sick. You may experience fever, chills and shaking during your first infusion. These effects are usually mild and can be treated with other medicines. Report any side effects during the infusion to your health care professional. Fever and  chills usually do not happen with later infusions. Do not become pregnant while taking this medicine or for 7 months after stopping it. Women should inform their doctor if they wish to become pregnant or think they might be pregnant. Women of child-bearing potential will need to have a negative pregnancy test before starting this medicine. There is a potential for serious side effects to an unborn child. Talk to your health care professional or pharmacist for more information. Do not breast-feed an infant while taking this medicine or for 7 months after stopping it. Women must use effective birth control with this medicine. What side effects may I notice from receiving this medicine? Side effects that you should report to your doctor or health care professional as soon as possible: allergic reactions like skin rash, itching or hives, swelling of the face, lips, or tongue chest pain or palpitations cough dizziness feeling faint or lightheaded, falls fever general ill feeling or flu-like symptoms signs of worsening heart failure like breathing problems; swelling in your legs and feet unusually weak or tired Side effects that usually do not require medical attention (report to your doctor or health care professional if they continue or are bothersome): bone pain changes in taste diarrhea joint pain nausea/vomiting weight loss This list may not describe all possible side effects. Call your doctor for medical advice about  side effects. You may report side effects to FDA at 1-800-FDA-1088. Where should I keep my medicine? This drug is given in a hospital or clinic and will not be stored at home. NOTE: This sheet is a summary. It may not cover all possible information. If you have questions about this medicine, talk to your doctor, pharmacist, or health care provider.  2021 Elsevier/Gold Standard (2016-02-28 14:37:52)  Pertuzumab injection What is this medicine? PERTUZUMAB (per TOOZ ue mab) is  a monoclonal antibody. It is used to treat breast cancer. This medicine may be used for other purposes; ask your health care provider or pharmacist if you have questions. COMMON BRAND NAME(S): PERJETA What should I tell my health care provider before I take this medicine? They need to know if you have any of these conditions: heart disease heart failure high blood pressure history of irregular heart beat recent or ongoing radiation therapy an unusual or allergic reaction to pertuzumab, other medicines, foods, dyes, or preservatives pregnant or trying to get pregnant breast-feeding How should I use this medicine? This medicine is for infusion into a vein. It is given by a health care professional in a hospital or clinic setting. Talk to your pediatrician regarding the use of this medicine in children. Special care may be needed. Overdosage: If you think you have taken too much of this medicine contact a poison control center or emergency room at once. NOTE: This medicine is only for you. Do not share this medicine with others. What if I miss a dose? It is important not to miss your dose. Call your doctor or health care professional if you are unable to keep an appointment. What may interact with this medicine? Interactions are not expected. Give your health care provider a list of all the medicines, herbs, non-prescription drugs, or dietary supplements you use. Also tell them if you smoke, drink alcohol, or use illegal drugs. Some items may interact with your medicine. This list may not describe all possible interactions. Give your health care provider a list of all the medicines, herbs, non-prescription drugs, or dietary supplements you use. Also tell them if you smoke, drink alcohol, or use illegal drugs. Some items may interact with your medicine. What should I watch for while using this medicine? Your condition will be monitored carefully while you are receiving this medicine. Report any  side effects. Continue your course of treatment even though you feel ill unless your doctor tells you to stop. Do not become pregnant while taking this medicine or for 7 months after stopping it. Women should inform their doctor if they wish to become pregnant or think they might be pregnant. Women of child-bearing potential will need to have a negative pregnancy test before starting this medicine. There is a potential for serious side effects to an unborn child. Talk to your health care professional or pharmacist for more information. Do not breast-feed an infant while taking this medicine or for 7 months after stopping it. Women must use effective birth control with this medicine. Call your doctor or health care professional for advice if you get a fever, chills or sore throat, or other symptoms of a cold or flu. Do not treat yourself. Try to avoid being around people who are sick. You may experience fever, chills, and headache during the infusion. Report any side effects during the infusion to your health care professional. What side effects may I notice from receiving this medicine? Side effects that you should report to your doctor or  health care professional as soon as possible: breathing problems chest pain or palpitations dizziness feeling faint or lightheaded fever or chills skin rash, itching or hives sore throat swelling of the face, lips, or tongue swelling of the legs or ankles unusually weak or tired Side effects that usually do not require medical attention (report to your doctor or health care professional if they continue or are bothersome): diarrhea hair loss nausea, vomiting tiredness This list may not describe all possible side effects. Call your doctor for medical advice about side effects. You may report side effects to FDA at 1-800-FDA-1088. Where should I keep my medicine? This drug is given in a hospital or clinic and will not be stored at home. NOTE: This sheet is a  summary. It may not cover all possible information. If you have questions about this medicine, talk to your doctor, pharmacist, or health care provider.  2021 Elsevier/Gold Standard (2015-04-07 12:08:50)   Pertuzumab injection What is this medicine? PERTUZUMAB (per TOOZ ue mab) is a monoclonal antibody. It is used to treat breast cancer. This medicine may be used for other purposes; ask your health care provider or pharmacist if you have questions. COMMON BRAND NAME(S): PERJETA What should I tell my health care provider before I take this medicine? They need to know if you have any of these conditions: heart disease heart failure high blood pressure history of irregular heart beat recent or ongoing radiation therapy an unusual or allergic reaction to pertuzumab, other medicines, foods, dyes, or preservatives pregnant or trying to get pregnant breast-feeding How should I use this medicine? This medicine is for infusion into a vein. It is given by a health care professional in a hospital or clinic setting. Talk to your pediatrician regarding the use of this medicine in children. Special care may be needed. Overdosage: If you think you have taken too much of this medicine contact a poison control center or emergency room at once. NOTE: This medicine is only for you. Do not share this medicine with others. What if I miss a dose? It is important not to miss your dose. Call your doctor or health care professional if you are unable to keep an appointment. What may interact with this medicine? Interactions are not expected. Give your health care provider a list of all the medicines, herbs, non-prescription drugs, or dietary supplements you use. Also tell them if you smoke, drink alcohol, or use illegal drugs. Some items may interact with your medicine. This list may not describe all possible interactions. Give your health care provider a list of all the medicines, herbs, non-prescription drugs,  or dietary supplements you use. Also tell them if you smoke, drink alcohol, or use illegal drugs. Some items may interact with your medicine. What should I watch for while using this medicine? Your condition will be monitored carefully while you are receiving this medicine. Report any side effects. Continue your course of treatment even though you feel ill unless your doctor tells you to stop. Do not become pregnant while taking this medicine or for 7 months after stopping it. Women should inform their doctor if they wish to become pregnant or think they might be pregnant. Women of child-bearing potential will need to have a negative pregnancy test before starting this medicine. There is a potential for serious side effects to an unborn child. Talk to your health care professional or pharmacist for more information. Do not breast-feed an infant while taking this medicine or for 7 months after stopping  it. Women must use effective birth control with this medicine. Call your doctor or health care professional for advice if you get a fever, chills or sore throat, or other symptoms of a cold or flu. Do not treat yourself. Try to avoid being around people who are sick. You may experience fever, chills, and headache during the infusion. Report any side effects during the infusion to your health care professional. What side effects may I notice from receiving this medicine? Side effects that you should report to your doctor or health care professional as soon as possible: breathing problems chest pain or palpitations dizziness feeling faint or lightheaded fever or chills skin rash, itching or hives sore throat swelling of the face, lips, or tongue swelling of the legs or ankles unusually weak or tired Side effects that usually do not require medical attention (report to your doctor or health care professional if they continue or are bothersome): diarrhea hair loss nausea, vomiting tiredness This  list may not describe all possible side effects. Call your doctor for medical advice about side effects. You may report side effects to FDA at 1-800-FDA-1088. Where should I keep my medicine? This drug is given in a hospital or clinic and will not be stored at home. NOTE: This sheet is a summary. It may not cover all possible information. If you have questions about this medicine, talk to your doctor, pharmacist, or health care provider.  2021 Elsevier/Gold Standard (2015-04-07 12:08:50)  Docetaxel injection What is this medicine? DOCETAXEL (doe se TAX el) is a chemotherapy drug. It targets fast dividing cells, like cancer cells, and causes these cells to die. This medicine is used to treat many types of cancers like breast cancer, certain stomach cancers, head and neck cancer, lung cancer, and prostate cancer. This medicine may be used for other purposes; ask your health care provider or pharmacist if you have questions. COMMON BRAND NAME(S): Docefrez, Taxotere What should I tell my health care provider before I take this medicine? They need to know if you have any of these conditions: infection (especially a virus infection such as chickenpox, cold sores, or herpes) liver disease low blood counts, like low white cell, platelet, or red cell counts an unusual or allergic reaction to docetaxel, polysorbate 80, other chemotherapy agents, other medicines, foods, dyes, or preservatives pregnant or trying to get pregnant breast-feeding How should I use this medicine? This drug is given as an infusion into a vein. It is administered in a hospital or clinic by a specially trained health care professional. Talk to your pediatrician regarding the use of this medicine in children. Special care may be needed. Overdosage: If you think you have taken too much of this medicine contact a poison control center or emergency room at once. NOTE: This medicine is only for you. Do not share this medicine with  others. What if I miss a dose? It is important not to miss your dose. Call your doctor or health care professional if you are unable to keep an appointment. What may interact with this medicine? Do not take this medicine with any of the following medications: live virus vaccines This medicine may also interact with the following medications: aprepitant certain antibiotics like erythromycin or clarithromycin certain antivirals for HIV or hepatitis certain medicines for fungal infections like fluconazole, itraconazole, ketoconazole, posaconazole, or voriconazole cimetidine ciprofloxacin conivaptan cyclosporine dronedarone fluvoxamine grapefruit juice imatinib verapamil This list may not describe all possible interactions. Give your health care provider a list of all the  medicines, herbs, non-prescription drugs, or dietary supplements you use. Also tell them if you smoke, drink alcohol, or use illegal drugs. Some items may interact with your medicine. What should I watch for while using this medicine? Your condition will be monitored carefully while you are receiving this medicine. You will need important blood work done while you are taking this medicine. Call your doctor or health care professional for advice if you get a fever, chills or sore throat, or other symptoms of a cold or flu. Do not treat yourself. This drug decreases your body's ability to fight infections. Try to avoid being around people who are sick. Some products may contain alcohol. Ask your health care professional if this medicine contains alcohol. Be sure to tell all health care professionals you are taking this medicine. Certain medicines, like metronidazole and disulfiram, can cause an unpleasant reaction when taken with alcohol. The reaction includes flushing, headache, nausea, vomiting, sweating, and increased thirst. The reaction can last from 30 minutes to several hours. You may get drowsy or dizzy. Do not drive, use  machinery, or do anything that needs mental alertness until you know how this medicine affects you. Do not stand or sit up quickly, especially if you are an older patient. This reduces the risk of dizzy or fainting spells. Alcohol may interfere with the effect of this medicine. Talk to your health care professional about your risk of cancer. You may be more at risk for certain types of cancer if you take this medicine. Do not become pregnant while taking this medicine or for 6 months after stopping it. Women should inform their doctor if they wish to become pregnant or think they might be pregnant. There is a potential for serious side effects to an unborn child. Talk to your health care professional or pharmacist for more information. Do not breast-feed an infant while taking this medicine or for 1 week after stopping it. Males who get this medicine must use a condom during sex with females who can get pregnant. If you get a woman pregnant, the baby could have birth defects. The baby could die before they are born. You will need to continue wearing a condom for 3 months after stopping the medicine. Tell your health care provider right away if your partner becomes pregnant while you are taking this medicine. This may interfere with the ability to father a child. You should talk to your doctor or health care professional if you are concerned about your fertility. What side effects may I notice from receiving this medicine? Side effects that you should report to your doctor or health care professional as soon as possible: allergic reactions like skin rash, itching or hives, swelling of the face, lips, or tongue blurred vision breathing problems changes in vision low blood counts - This drug may decrease the number of white blood cells, red blood cells and platelets. You may be at increased risk for infections and bleeding. nausea and vomiting pain, redness or irritation at site where injected pain,  tingling, numbness in the hands or feet redness, blistering, peeling, or loosening of the skin, including inside the mouth signs of decreased platelets or bleeding - bruising, pinpoint red spots on the skin, black, tarry stools, nosebleeds signs of decreased red blood cells - unusually weak or tired, fainting spells, lightheadedness signs of infection - fever or chills, cough, sore throat, pain or difficulty passing urine swelling of the ankle, feet, hands Side effects that usually do not require medical attention (  report to your doctor or health care professional if they continue or are bothersome): constipation diarrhea fingernail or toenail changes hair loss loss of appetite mouth sores muscle pain This list may not describe all possible side effects. Call your doctor for medical advice about side effects. You may report side effects to FDA at 1-800-FDA-1088. Where should I keep my medicine? This drug is given in a hospital or clinic and will not be stored at home. NOTE: This sheet is a summary. It may not cover all possible information. If you have questions about this medicine, talk to your doctor, pharmacist, or health care provider.  2021 Elsevier/Gold Standard (2019-02-02 19:50:31)  Carboplatin injection What is this medicine? CARBOPLATIN (KAR boe pla tin) is a chemotherapy drug. It targets fast dividing cells, like cancer cells, and causes these cells to die. This medicine is used to treat ovarian cancer and many other cancers. This medicine may be used for other purposes; ask your health care provider or pharmacist if you have questions. COMMON BRAND NAME(S): Paraplatin What should I tell my health care provider before I take this medicine? They need to know if you have any of these conditions: blood disorders hearing problems kidney disease recent or ongoing radiation therapy an unusual or allergic reaction to carboplatin, cisplatin, other chemotherapy, other medicines,  foods, dyes, or preservatives pregnant or trying to get pregnant breast-feeding How should I use this medicine? This drug is usually given as an infusion into a vein. It is administered in a hospital or clinic by a specially trained health care professional. Talk to your pediatrician regarding the use of this medicine in children. Special care may be needed. Overdosage: If you think you have taken too much of this medicine contact a poison control center or emergency room at once. NOTE: This medicine is only for you. Do not share this medicine with others. What if I miss a dose? It is important not to miss a dose. Call your doctor or health care professional if you are unable to keep an appointment. What may interact with this medicine? medicines for seizures medicines to increase blood counts like filgrastim, pegfilgrastim, sargramostim some antibiotics like amikacin, gentamicin, neomycin, streptomycin, tobramycin vaccines Talk to your doctor or health care professional before taking any of these medicines: acetaminophen aspirin ibuprofen ketoprofen naproxen This list may not describe all possible interactions. Give your health care provider a list of all the medicines, herbs, non-prescription drugs, or dietary supplements you use. Also tell them if you smoke, drink alcohol, or use illegal drugs. Some items may interact with your medicine. What should I watch for while using this medicine? Your condition will be monitored carefully while you are receiving this medicine. You will need important blood work done while you are taking this medicine. This drug may make you feel generally unwell. This is not uncommon, as chemotherapy can affect healthy cells as well as cancer cells. Report any side effects. Continue your course of treatment even though you feel ill unless your doctor tells you to stop. In some cases, you may be given additional medicines to help with side effects. Follow all  directions for their use. Call your doctor or health care professional for advice if you get a fever, chills or sore throat, or other symptoms of a cold or flu. Do not treat yourself. This drug decreases your body's ability to fight infections. Try to avoid being around people who are sick. This medicine may increase your risk to bruise or bleed.  Call your doctor or health care professional if you notice any unusual bleeding. Be careful brushing and flossing your teeth or using a toothpick because you may get an infection or bleed more easily. If you have any dental work done, tell your dentist you are receiving this medicine. Avoid taking products that contain aspirin, acetaminophen, ibuprofen, naproxen, or ketoprofen unless instructed by your doctor. These medicines may hide a fever. Do not become pregnant while taking this medicine. Women should inform their doctor if they wish to become pregnant or think they might be pregnant. There is a potential for serious side effects to an unborn child. Talk to your health care professional or pharmacist for more information. Do not breast-feed an infant while taking this medicine. What side effects may I notice from receiving this medicine? Side effects that you should report to your doctor or health care professional as soon as possible: allergic reactions like skin rash, itching or hives, swelling of the face, lips, or tongue signs of infection - fever or chills, cough, sore throat, pain or difficulty passing urine signs of decreased platelets or bleeding - bruising, pinpoint red spots on the skin, black, tarry stools, nosebleeds signs of decreased red blood cells - unusually weak or tired, fainting spells, lightheadedness breathing problems changes in hearing changes in vision chest pain high blood pressure low blood counts - This drug may decrease the number of white blood cells, red blood cells and platelets. You may be at increased risk for  infections and bleeding. nausea and vomiting pain, swelling, redness or irritation at the injection site pain, tingling, numbness in the hands or feet problems with balance, talking, walking trouble passing urine or change in the amount of urine Side effects that usually do not require medical attention (report to your doctor or health care professional if they continue or are bothersome): hair loss loss of appetite metallic taste in the mouth or changes in taste This list may not describe all possible side effects. Call your doctor for medical advice about side effects. You may report side effects to FDA at 1-800-FDA-1088. Where should I keep my medicine? This drug is given in a hospital or clinic and will not be stored at home. NOTE: This sheet is a summary. It may not cover all possible information. If you have questions about this medicine, talk to your doctor, pharmacist, or health care provider.  2021 Elsevier/Gold Standard (2007-06-10 14:38:05)

## 2020-10-26 NOTE — Assessment & Plan Note (Signed)
06/23/2020:Normal mammogram September 2021. December 2021: Following trauma to the breast she felt a lump but for variety of reasons work-up was postponed. 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  Treatment Plan: 1. Neoadjuvant chemotherapy with TCH Perjeta 6 cycles followed by Herceptin Perjeta maintenance versus Kadcyla maintenance (based on response to neoadjuvant chemo) for 1 year 2. Followed by breast conserving surgery if possible withaxillary lymph node dissection 3. Followed by adjuvant radiation therapy 4.Followed by antiestrogen therapy 5.Followed by neratinib MRI liver 07/11/20: Hepatic cysts Bone scan and CT CAP: No mets Breast MRI 06/28/2020: 2 large left breast masses each 0.5 cm, overall 7 cm, third contiguous mass 3 cm (could be a lymph node, abutting pectoralis muscle), 4 lymph nodes left axilla ---------------------------------------------------------------------------------------------------------------------------- Current Treatment:[id number] ECHO: 07/11/20: EF 60-65% Labs reviewed  Chemo Toxicities: Denies any nausea or vomiting. 1.Diarrhea: Occasionally 3-4 loose stools per day.Imodium is helping her 2.Fatigue day 4 today 6 3.Alopecia 4.Chemotherapy induced anemia: Monitoring hemoglobin 11.4  Monitoring blood counts. Breast MRI ordered Patient has appointment to see Dr. Donne Hazel.  Herceptin Perjeta in 3 weeks. Return to clinic after surgery to discuss final pathology and determine if Herceptin Perjeta maintenance versus Kadcyla maintenance is warranted.

## 2020-10-27 ENCOUNTER — Ambulatory Visit
Admission: RE | Admit: 2020-10-27 | Discharge: 2020-10-27 | Disposition: A | Discharge status: Home / Self Care | Payer: 59 | Source: Ambulatory Visit | Attending: Hematology and Oncology | Admitting: Hematology and Oncology

## 2020-10-27 ENCOUNTER — Encounter: Payer: Self-pay | Admitting: *Deleted

## 2020-10-27 DIAGNOSIS — C50512 Malignant neoplasm of lower-outer quadrant of left female breast: Secondary | ICD-10-CM

## 2020-10-27 MED ORDER — GADOBUTROL 1 MMOL/ML IV SOLN
7.0000 mL | Freq: Once | INTRAVENOUS | Status: AC | PRN
  Administered 2020-10-27: 7 mL via INTRAVENOUS

## 2020-10-28 ENCOUNTER — Inpatient Hospital Stay: Payer: 59

## 2020-10-28 ENCOUNTER — Other Ambulatory Visit: Payer: Self-pay

## 2020-10-28 VITALS — BP 107/71 | HR 73 | Temp 98.0°F | Resp 18

## 2020-10-28 DIAGNOSIS — C50512 Malignant neoplasm of lower-outer quadrant of left female breast: Secondary | ICD-10-CM

## 2020-10-28 DIAGNOSIS — Z5111 Encounter for antineoplastic chemotherapy: Secondary | ICD-10-CM | POA: Diagnosis not present

## 2020-10-28 MED ORDER — PEGFILGRASTIM-BMEZ 6 MG/0.6ML ~~LOC~~ SOSY
6.0000 mg | PREFILLED_SYRINGE | Freq: Once | SUBCUTANEOUS | Status: AC
  Administered 2020-10-28: 6 mg via SUBCUTANEOUS
  Filled 2020-10-28: qty 0.6

## 2020-10-31 ENCOUNTER — Other Ambulatory Visit: Payer: Self-pay | Admitting: General Surgery

## 2020-10-31 DIAGNOSIS — C50512 Malignant neoplasm of lower-outer quadrant of left female breast: Secondary | ICD-10-CM

## 2020-11-01 ENCOUNTER — Encounter: Payer: Self-pay | Admitting: *Deleted

## 2020-11-03 ENCOUNTER — Telehealth: Payer: Self-pay | Admitting: Hematology and Oncology

## 2020-11-03 NOTE — Telephone Encounter (Signed)
Scheduled appt per 8/16 sch msg. Pt aware.

## 2020-11-14 ENCOUNTER — Encounter: Payer: Self-pay | Admitting: *Deleted

## 2020-11-15 NOTE — Pre-Procedure Instructions (Signed)
Surgical Instructions    Your procedure is scheduled on Wednesday 11/30/20.   Report to [facility] Main Entrance "A" at 06:30 A.M., then check in with the Admitting office.  Call this number if you have problems the morning of surgery:  [phone]   If you have any questions prior to your surgery date call [phone]: Open Monday-Friday 8am-4pm    Remember:  Do not eat after midnight the night before your surgery  You may drink clear liquids until 05:30 A.M. the morning of your surgery.   Clear liquids allowed are: Water, Non-Citrus Juices (without pulp), Carbonated Beverages, Clear Tea, Black Coffee ONLY (NO MILK, CREAM OR POWDERED CREAMER of any kind), and Gatorade    Take these medicines the morning of surgery with A SIP OF WATER   ondansetron (ZOFRAN)- If needed  prochlorperazine (COMPAZINE)- If needed    As of today, STOP taking any Aspirin (unless otherwise instructed by your surgeon) Aleve, Naproxen, Ibuprofen, Motrin, Advil, Goody's, BC's, all herbal medications, fish oil, and all vitamins.          Do not wear jewelry or makeup Do not wear lotions, powders, perfumes/colognes, or deodorant. Do not shave 48 hours prior to surgery.  Men may shave face and neck. Do not bring valuables to the hospital. DO Not wear nail polish, gel polish, artificial nails, or any other type of covering on natural nails including finger and toenails. If patients have artificial nails, gel coating, etc. that need to be removed by a nail salon please have this removed prior to surgery or surgery may need to be canceled/delayed if the surgeon/ anesthesia feels like the patient is unable to be adequately monitored.             [facility] is not responsible for any belongings or valuables.  Do NOT Smoke (Tobacco/Vaping) or drink Alcohol 24 hours prior to your procedure If you use a CPAP at night, you may bring all equipment for your overnight stay.   Contacts, glasses, dentures or bridgework  may not be worn into surgery, please bring cases for these belongings   For patients admitted to the hospital, discharge time will be determined by your treatment team.   Patients discharged the day of surgery will not be allowed to drive home, and someone needs to stay with them for 24 hours.  ONLY 1 SUPPORT PERSON MAY BE PRESENT WHILE YOU ARE IN SURGERY. IF YOU ARE TO BE ADMITTED ONCE YOU ARE IN YOUR ROOM YOU WILL BE ALLOWED TWO (2) VISITORS.  Minor children may have two parents present. Special consideration for safety and communication needs will be reviewed on a case by case basis.  Special instructions:    Oral Hygiene is also important to reduce your risk of infection.  Remember - BRUSH YOUR TEETH THE MORNING OF SURGERY WITH YOUR REGULAR TOOTHPASTE   [facility] [facility]- Preparing For Surgery  Before surgery, you can play an important role. Because skin is not sterile, your skin needs to be as free of germs as possible. You can reduce the number of germs on your skin by washing with CHG (chlorahexidine gluconate) Soap before surgery.  CHG is an antiseptic cleaner which kills germs and bonds with the skin to continue killing germs even after washing.     Please do not use if you have an allergy to CHG or antibacterial soaps. If your skin becomes reddened/irritated stop using the CHG.  Do not shave (including legs and underarms) for at least  48 hours prior to first CHG shower. It is OK to shave your face.  Please follow these instructions carefully.     Shower the NIGHT BEFORE SURGERY and the MORNING OF SURGERY with CHG Soap.   If you chose to wash your hair, wash your hair first as usual with your normal shampoo. After you shampoo, rinse your hair and body thoroughly to remove the shampoo.  Then [employer] and genitals (private parts) with your normal soap and rinse thoroughly to remove soap.  After that Use CHG Soap as you would any other liquid soap. You can apply CHG directly to the skin  and wash gently with a scrungie or a clean washcloth.   Apply the CHG Soap to your body ONLY FROM THE NECK DOWN.  Do not use on open wounds or open sores. Avoid contact with your eyes, ears, mouth and genitals (private parts). Wash Face and genitals (private parts)  with your normal soap.   Wash thoroughly, paying special attention to the area where your surgery will be performed.  Thoroughly rinse your body with warm water from the neck down.  DO NOT shower/wash with your normal soap after using and rinsing off the CHG Soap.  Pat yourself dry with a CLEAN TOWEL.  Wear CLEAN PAJAMAS to bed the night before surgery  Place CLEAN SHEETS on your bed the night before your surgery  DO NOT SLEEP WITH PETS.   Day of Surgery:  Take a shower with CHG soap. Wear Clean/Comfortable clothing the morning of surgery Do not apply any deodorants/lotions.   Remember to brush your teeth WITH YOUR REGULAR TOOTHPASTE.   Please read over the following fact sheets that you were given.

## 2020-11-16 ENCOUNTER — Encounter (HOSPITAL_COMMUNITY): Payer: Self-pay

## 2020-11-16 ENCOUNTER — Other Ambulatory Visit: Payer: Self-pay

## 2020-11-16 ENCOUNTER — Inpatient Hospital Stay: Payer: 59

## 2020-11-16 ENCOUNTER — Encounter (HOSPITAL_COMMUNITY)
Admission: RE | Admit: 2020-11-16 | Discharge: 2020-11-16 | Disposition: A | Discharge status: Home / Self Care | Payer: 59 | Source: Ambulatory Visit | Attending: General Surgery | Admitting: General Surgery

## 2020-11-16 VITALS — BP 118/78 | HR 65 | Temp 98.1°F | Resp 17 | Ht 69.5 in | Wt 147.0 lb

## 2020-11-16 DIAGNOSIS — Z5111 Encounter for antineoplastic chemotherapy: Secondary | ICD-10-CM | POA: Diagnosis not present

## 2020-11-16 DIAGNOSIS — Z01812 Encounter for preprocedural laboratory examination: Secondary | ICD-10-CM | POA: Insufficient documentation

## 2020-11-16 DIAGNOSIS — C50512 Malignant neoplasm of lower-outer quadrant of left female breast: Secondary | ICD-10-CM

## 2020-11-16 DIAGNOSIS — Z170 Estrogen receptor positive status [ER+]: Secondary | ICD-10-CM

## 2020-11-16 HISTORY — DX: Cardiac murmur, unspecified: R01.1

## 2020-11-16 HISTORY — DX: Myoneural disorder, unspecified: G70.9

## 2020-11-16 MED ORDER — SODIUM CHLORIDE 0.9% FLUSH
10.0000 mL | INTRAVENOUS | Status: DC | PRN
Start: 2020-11-16 — End: 2020-11-16
  Administered 2020-11-16: 10 mL

## 2020-11-16 MED ORDER — SODIUM CHLORIDE 0.9 % IV SOLN
420.0000 mg | Freq: Once | INTRAVENOUS | Status: AC
  Administered 2020-11-16: 420 mg via INTRAVENOUS
  Filled 2020-11-16: qty 14

## 2020-11-16 MED ORDER — ACETAMINOPHEN 325 MG PO TABS
650.0000 mg | ORAL_TABLET | Freq: Once | ORAL | Status: AC
  Administered 2020-11-16: 650 mg via ORAL
  Filled 2020-11-16: qty 2

## 2020-11-16 MED ORDER — SODIUM CHLORIDE 0.9 % IV SOLN: Freq: Once | INTRAVENOUS | Status: AC

## 2020-11-16 MED ORDER — TRASTUZUMAB-ANNS CHEMO 150 MG IV SOLR
6.0000 mg/kg | Freq: Once | INTRAVENOUS | Status: AC
  Administered 2020-11-16: 420 mg via INTRAVENOUS
  Filled 2020-11-16: qty 20

## 2020-11-16 MED ORDER — HEPARIN SOD (PORK) LOCK FLUSH 100 UNIT/ML IV SOLN
500.0000 [IU] | Freq: Once | INTRAVENOUS | Status: AC | PRN
  Administered 2020-11-16: 500 [IU]

## 2020-11-16 MED ORDER — DIPHENHYDRAMINE HCL 25 MG PO CAPS
50.0000 mg | ORAL_CAPSULE | Freq: Once | ORAL | Status: AC
Start: 2020-11-16 — End: 2020-11-16
  Administered 2020-11-16: 50 mg via ORAL
  Filled 2020-11-16: qty 2

## 2020-11-16 NOTE — Patient Instructions (Signed)
[facility] MEDICAL [facility]COLOG[facility]  Discharge Instructions: Thank you for choosing [facility] to provide your oncology and hematology care.   If you have a lab appointment with the Cancer Center, please go directly to the Cancer Center and check in at the registration area.   Wear comfortable clothing and clothing appropriate for easy access to any Portacath or PICC line.   We strive to give you quality time with your provider. You may need to reschedule your appointment if you arrive late (15 or more minutes).  Arriving late affects you and other patients whose appointments are after yours.  Also, if you miss three or more appointments without notifying the office, you may be dismissed from the clinic at the provider's discretion.      For prescription refill requests, have your pharmacy contact our office and allow 72 hours for refills to be completed.    Today you received the following chemotherapy and/or immunotherapy agents: Kanjinti, Perjeta      To help prevent nausea and vomiting after your treatment, we encourage you to take your nausea medication as directed.  BELOW ARE SYMPTOMS THAT SHOULD BE REPORTED IMMEDIATELY: *FEVER GREATER THAN 100.4 F (38 C) OR HIGHER *CHILLS OR SWEATING *NAUSEA AND VOMITING THAT IS NOT CONTROLLED WITH YOUR NAUSEA MEDICATION *UNUSUAL SHORTNESS OF BREATH *UNUSUAL BRUISING OR BLEEDING *URINARY PROBLEMS (pain or burning when urinating, or frequent urination) *BOWEL PROBLEMS (unusual diarrhea, constipation, pain near the anus) TENDERNESS IN MOUTH AND THROAT WITH OR WITHOUT PRESENCE OF ULCERS (sore throat, sores in mouth, or a toothache) UNUSUAL RASH, SWELLING OR PAIN  UNUSUAL VAGINAL DISCHARGE OR ITCHING   Items with * indicate a potential emergency and should be followed up as soon as possible or go to the Emergency Department if any problems should occur.  Please show the CHEMOTHERAPY ALERT CARD or IMMUNOTHERAPY ALERT CARD at  check-in to the Emergency Department and triage nurse.  Should you have questions after your visit or need to cancel or reschedule your appointment, please contact [facility]  Dept: [phone]  and follow the prompts.  Office hours are 8:00 a.m. to 4:30 p.m. Monday - Friday. Please note that voicemails left after 4:00 p.m. may not be returned until the following business day.  We are closed weekends and major holidays. You have access to a nurse at all times for urgent questions. Please call the main number to the clinic Dept: [phone] and follow the prompts.   For any non-urgent questions, you may also contact your provider using MyChart. We now offer e-Visits for anyone 18 and older to request care online for non-urgent symptoms. For details visit [email or web address].   Also download the MyChart app! Go to the app store, search "MyChart", open the app, select [facility], and log in with your MyChart username and password.  Due to Covid, a mask is required upon entering the hospital/clinic. If you do not have a mask, one will be given to you upon arrival. For doctor visits, patients may have 1 support person aged 18 or older with them. For treatment visits, patients cannot have anyone with them due to current Covid guidelines and our immunocompromised population.  

## 2020-11-16 NOTE — Progress Notes (Signed)
PCP - Lucita Lora, NP Cardiologist - Teola Bradley once for abnormal echo Oncologist: Dr Lindi Adie  PPM/ICD - denies   Chest x-ray - 07/12/20 EKG - 10/21/20 Stress Test -  ECHO - 10/13/20 Cardiac Cath - denies  Sleep Study - denies   No diabetes  As of today, STOP taking any Aspirin (unless otherwise instructed by your surgeon) Aleve, Naproxen, Ibuprofen, Motrin, Advil, Goody's, BC's, all herbal medications, fish oil, and all vitamins.  ERAS Protcol -yes   COVID TEST- scheduled for 11/28/20 at 0915   Anesthesia review: yes, recent chemotherapy and current immunotherapy  Patient denies shortness of breath, fever, cough and chest pain at PAT appointment   All instructions explained to the patient, with a verbal understanding of the material. Patient agrees to go over the instructions while at home for a better understanding. Patient also instructed to self quarantine after being tested for COVID-19. The opportunity to ask questions was provided.

## 2020-11-28 ENCOUNTER — Other Ambulatory Visit (HOSPITAL_COMMUNITY)
Admission: RE | Admit: 2020-11-28 | Discharge: 2020-11-28 | Disposition: A | Discharge status: Home / Self Care | Payer: 59 | Source: Ambulatory Visit | Attending: General Surgery | Admitting: General Surgery

## 2020-11-28 ENCOUNTER — Other Ambulatory Visit (HOSPITAL_COMMUNITY): Payer: 59

## 2020-11-28 DIAGNOSIS — Z01812 Encounter for preprocedural laboratory examination: Secondary | ICD-10-CM | POA: Diagnosis not present

## 2020-11-28 DIAGNOSIS — Z20822 Contact with and (suspected) exposure to covid-19: Secondary | ICD-10-CM | POA: Diagnosis not present

## 2020-11-28 LAB — SARS CORONAVIRUS 2 (TAT 6-24 HRS): SARS Coronavirus 2: NEGATIVE

## 2020-11-29 ENCOUNTER — Encounter (HOSPITAL_COMMUNITY): Payer: Self-pay | Admitting: General Surgery

## 2020-11-29 NOTE — Anesthesia Preprocedure Evaluation (Addendum)
Anesthesia Evaluation  Patient identified by MRN, date of birth, ID band Patient awake    Reviewed: Allergy & Precautions, NPO status , Patient's Chart, lab work & pertinent test results  Airway Mallampati: I  TM Distance: >3 FB Neck ROM: Full    Dental no notable dental hx. (+) Teeth Intact, Dental Advisory Given   Pulmonary former smoker,    Pulmonary exam normal breath sounds clear to auscultation       Cardiovascular negative cardio ROS Normal cardiovascular exam+ Valvular Problems/Murmurs MR  Rhythm:Regular Rate:Normal     Neuro/Psych Peripheral neuropathy  Neuromuscular disease negative psych ROS   GI/Hepatic negative GI ROS, Neg liver ROS,   Endo/Other  Left Breast Ca  Renal/GU negative Renal ROS  negative genitourinary   Musculoskeletal negative musculoskeletal ROS (+)   Abdominal   Peds  Hematology  (+) anemia ,   Anesthesia Other Findings   Reproductive/Obstetrics                           Anesthesia Physical Anesthesia Plan  ASA: 2  Anesthesia Plan: General   Post-op Pain Management:  Regional for Post-op pain   Induction: Intravenous  PONV Risk Score and Plan: 4 or greater and Treatment may vary due to age or medical condition, Midazolam, Scopolamine patch - Pre-op, Ondansetron and Dexamethasone  Airway Management Planned: LMA  Additional Equipment:   Intra-op Plan:   Post-operative Plan: Extubation in OR  Informed Consent: I have reviewed the patients History and Physical, chart, labs and discussed the procedure including the risks, benefits and alternatives for the proposed anesthesia with the patient or authorized representative who has indicated his/her understanding and acceptance.     Dental advisory given  Plan Discussed with: CRNA and Anesthesiologist  Anesthesia Plan Comments:        Anesthesia Quick Evaluation

## 2020-11-30 ENCOUNTER — Encounter (HOSPITAL_COMMUNITY)
Admission: RE | Disposition: A | Discharge status: Home / Self Care | Payer: Self-pay | Source: Home / Self Care | Attending: General Surgery

## 2020-11-30 ENCOUNTER — Ambulatory Visit (HOSPITAL_COMMUNITY): Payer: 59 | Admitting: Physician Assistant

## 2020-11-30 ENCOUNTER — Ambulatory Visit (HOSPITAL_COMMUNITY): Payer: 59 | Admitting: Anesthesiology

## 2020-11-30 ENCOUNTER — Observation Stay (HOSPITAL_COMMUNITY)
Admission: RE | Admit: 2020-11-30 | Discharge: 2020-12-01 | Disposition: A | Discharge status: Home / Self Care | Payer: 59 | Attending: General Surgery | Admitting: General Surgery

## 2020-11-30 ENCOUNTER — Other Ambulatory Visit: Payer: Self-pay

## 2020-11-30 ENCOUNTER — Ambulatory Visit (HOSPITAL_COMMUNITY): Payer: 59

## 2020-11-30 ENCOUNTER — Encounter (HOSPITAL_COMMUNITY): Payer: Self-pay | Admitting: General Surgery

## 2020-11-30 DIAGNOSIS — C773 Secondary and unspecified malignant neoplasm of axilla and upper limb lymph nodes: Secondary | ICD-10-CM | POA: Diagnosis not present

## 2020-11-30 DIAGNOSIS — Z87891 Personal history of nicotine dependence: Secondary | ICD-10-CM | POA: Insufficient documentation

## 2020-11-30 DIAGNOSIS — Z170 Estrogen receptor positive status [ER+]: Secondary | ICD-10-CM | POA: Insufficient documentation

## 2020-11-30 DIAGNOSIS — Z79899 Other long term (current) drug therapy: Secondary | ICD-10-CM | POA: Diagnosis not present

## 2020-11-30 DIAGNOSIS — Z9012 Acquired absence of left breast and nipple: Secondary | ICD-10-CM

## 2020-11-30 DIAGNOSIS — C50512 Malignant neoplasm of lower-outer quadrant of left female breast: Secondary | ICD-10-CM | POA: Diagnosis not present

## 2020-11-30 DIAGNOSIS — Z9011 Acquired absence of right breast and nipple: Secondary | ICD-10-CM

## 2020-11-30 HISTORY — PX: RADIOACTIVE SEED GUIDED AXILLARY SENTINEL LYMPH NODE: SHX6735

## 2020-11-30 HISTORY — PX: SIMPLE MASTECTOMY WITH AXILLARY SENTINEL NODE BIOPSY: SHX6098

## 2020-11-30 LAB — BASIC METABOLIC PANEL
Anion gap: 5 (ref 5–15)
BUN: 9 mg/dL (ref 6–20)
CO2: 30 mmol/L (ref 22–32)
Calcium: 9.1 mg/dL (ref 8.9–10.3)
Chloride: 105 mmol/L (ref 98–111)
Creatinine, Ser: 0.68 mg/dL (ref 0.44–1.00)
GFR, Estimated: >60 mL/min (ref >60)
Glucose, Bld: 100 mg/dL — ABNORMAL HIGH (ref 70–99)
Potassium: 3.6 mmol/L (ref 3.5–5.1)
Sodium: 140 mmol/L (ref 135–145)

## 2020-11-30 LAB — CBC
HCT: 36.3 % (ref 36.0–46.0)
Hemoglobin: 11.9 g/dL — ABNORMAL LOW (ref 12.0–15.0)
MCH: 33.5 pg (ref 26.0–34.0)
MCHC: 32.8 g/dL (ref 30.0–36.0)
MCV: 102.3 fL — ABNORMAL HIGH (ref 80.0–100.0)
Platelets: 228 10*3/uL (ref 150–400)
RBC: 3.55 MIL/uL — ABNORMAL LOW (ref 3.87–5.11)
RDW: 14.3 % (ref 11.5–15.5)
WBC: 4.4 10*3/uL (ref 4.0–10.5)
nRBC: 0 % (ref 0.0–0.2)

## 2020-11-30 SURGERY — SIMPLE MASTECTOMY WITH AXILLARY SENTINEL NODE BIOPSY
Anesthesia: General | Site: Breast | Laterality: Left

## 2020-11-30 MED ORDER — NON FORMULARY
Status: DC | PRN
  Administered 2020-11-30: 4 mL via INTRAMUSCULAR

## 2020-11-30 MED ORDER — SUCCINYLCHOLINE CHLORIDE 200 MG/10ML IV SOSY
PREFILLED_SYRINGE | INTRAVENOUS | Status: AC
  Filled 2020-11-30: qty 10

## 2020-11-30 MED ORDER — HEMOSTATIC AGENTS (NO CHARGE) OPTIME
TOPICAL | Status: DC | PRN
Start: 2020-11-30 — End: 2020-11-30
  Administered 2020-11-30 (×2): 1 via TOPICAL

## 2020-11-30 MED ORDER — MIDAZOLAM HCL 2 MG/2ML IJ SOLN
INTRAMUSCULAR | Status: AC
  Filled 2020-11-30: qty 2

## 2020-11-30 MED ORDER — MIDAZOLAM HCL 5 MG/5ML IJ SOLN
INTRAMUSCULAR | Status: DC | PRN
  Administered 2020-11-30: 2 mg via INTRAVENOUS

## 2020-11-30 MED ORDER — DEXAMETHASONE SODIUM PHOSPHATE 10 MG/ML IJ SOLN
INTRAMUSCULAR | Status: AC
  Filled 2020-11-30: qty 1

## 2020-11-30 MED ORDER — SCOPOLAMINE 1 MG/3DAYS TD PT72
1.0000 | MEDICATED_PATCH | TRANSDERMAL | Status: DC
  Administered 2020-11-30: 1.5 mg via TRANSDERMAL
  Filled 2020-11-30: qty 1

## 2020-11-30 MED ORDER — LIDOCAINE HCL (CARDIAC) PF 100 MG/5ML IV SOSY
PREFILLED_SYRINGE | INTRAVENOUS | Status: DC | PRN
  Administered 2020-11-30: 60 mg via INTRAVENOUS

## 2020-11-30 MED ORDER — CHLORHEXIDINE GLUCONATE 0.12 % MT SOLN: 15.0000 mL | Freq: Once | OROMUCOSAL | Status: AC

## 2020-11-30 MED ORDER — ACETAMINOPHEN 500 MG PO TABS
1000.0000 mg | ORAL_TABLET | Freq: Four times a day (QID) | ORAL | Status: DC
  Administered 2020-11-30 – 2020-12-01 (×3): 1000 mg via ORAL
  Filled 2020-11-30 (×4): qty 2

## 2020-11-30 MED ORDER — PROPOFOL 10 MG/ML IV BOLUS
INTRAVENOUS | Status: AC
  Filled 2020-11-30: qty 20

## 2020-11-30 MED ORDER — LACTATED RINGERS IV SOLN: INTRAVENOUS | Status: DC

## 2020-11-30 MED ORDER — CHLORHEXIDINE GLUCONATE CLOTH 2 % EX PADS: 6.0000 | MEDICATED_PAD | Freq: Once | CUTANEOUS | Status: DC

## 2020-11-30 MED ORDER — CEFAZOLIN SODIUM-DEXTROSE 2-4 GM/100ML-% IV SOLN
2.0000 g | INTRAVENOUS | Status: AC
  Administered 2020-11-30: 2 g via INTRAVENOUS

## 2020-11-30 MED ORDER — PHENYLEPHRINE 40 MCG/ML (10ML) SYRINGE FOR IV PUSH (FOR BLOOD PRESSURE SUPPORT)
PREFILLED_SYRINGE | INTRAVENOUS | Status: AC
  Filled 2020-11-30: qty 10

## 2020-11-30 MED ORDER — 0.9 % SODIUM CHLORIDE (POUR BTL) OPTIME
TOPICAL | Status: DC | PRN
  Administered 2020-11-30: 1000 mL

## 2020-11-30 MED ORDER — SODIUM CHLORIDE 0.9 % IV SOLN: INTRAVENOUS | Status: DC

## 2020-11-30 MED ORDER — CEFAZOLIN SODIUM-DEXTROSE 2-4 GM/100ML-% IV SOLN
INTRAVENOUS | Status: AC
  Filled 2020-11-30: qty 100

## 2020-11-30 MED ORDER — OXYCODONE HCL 5 MG PO TABS
5.0000 mg | ORAL_TABLET | ORAL | Status: DC | PRN
Start: 2020-11-30 — End: 2020-12-01

## 2020-11-30 MED ORDER — MORPHINE SULFATE (PF) 2 MG/ML IV SOLN: 2.0000 mg | INTRAVENOUS | Status: DC | PRN

## 2020-11-30 MED ORDER — FENTANYL CITRATE (PF) 100 MCG/2ML IJ SOLN: 25.0000 ug | INTRAMUSCULAR | Status: DC | PRN

## 2020-11-30 MED ORDER — DEXAMETHASONE SODIUM PHOSPHATE 10 MG/ML IJ SOLN
INTRAMUSCULAR | Status: DC | PRN
  Administered 2020-11-30: 5 mg via INTRAVENOUS

## 2020-11-30 MED ORDER — ONDANSETRON HCL 4 MG/2ML IJ SOLN
INTRAMUSCULAR | Status: AC
  Filled 2020-11-30: qty 2

## 2020-11-30 MED ORDER — PROPOFOL 10 MG/ML IV BOLUS
INTRAVENOUS | Status: DC | PRN
  Administered 2020-11-30: 150 mg via INTRAVENOUS

## 2020-11-30 MED ORDER — BUPIVACAINE-EPINEPHRINE (PF) 0.5% -1:200000 IJ SOLN
INTRAMUSCULAR | Status: DC | PRN
  Administered 2020-11-30: 20 mL via PERINEURAL

## 2020-11-30 MED ORDER — CHLORHEXIDINE GLUCONATE 0.12 % MT SOLN
OROMUCOSAL | Status: AC
  Administered 2020-11-30: 15 mL via OROMUCOSAL
  Filled 2020-11-30: qty 15

## 2020-11-30 MED ORDER — EPHEDRINE 5 MG/ML INJ
INTRAVENOUS | Status: AC
  Filled 2020-11-30: qty 5

## 2020-11-30 MED ORDER — PHENYLEPHRINE HCL (PRESSORS) 10 MG/ML IV SOLN
INTRAVENOUS | Status: DC | PRN
  Administered 2020-11-30: 120 ug via INTRAVENOUS
  Administered 2020-11-30: 40 ug via INTRAVENOUS
  Administered 2020-11-30 (×2): 120 ug via INTRAVENOUS

## 2020-11-30 MED ORDER — KETOROLAC TROMETHAMINE 15 MG/ML IJ SOLN
15.0000 mg | INTRAMUSCULAR | Status: AC
  Administered 2020-11-30: 15 mg via INTRAVENOUS
  Filled 2020-11-30: qty 1

## 2020-11-30 MED ORDER — METHOCARBAMOL 500 MG PO TABS
500.0000 mg | ORAL_TABLET | Freq: Four times a day (QID) | ORAL | Status: DC | PRN
  Administered 2020-11-30: 500 mg via ORAL
  Filled 2020-11-30: qty 1

## 2020-11-30 MED ORDER — SIMETHICONE 80 MG PO CHEW: 40.0000 mg | CHEWABLE_TABLET | Freq: Four times a day (QID) | ORAL | Status: DC | PRN

## 2020-11-30 MED ORDER — BUPIVACAINE LIPOSOME 1.3 % IJ SUSP
INTRAMUSCULAR | Status: DC | PRN
  Administered 2020-11-30: 10 mL via PERINEURAL

## 2020-11-30 MED ORDER — ACETAMINOPHEN 500 MG PO TABS
ORAL_TABLET | ORAL | Status: AC
  Administered 2020-11-30: 1000 mg via ORAL
  Filled 2020-11-30: qty 2

## 2020-11-30 MED ORDER — ORAL CARE MOUTH RINSE: 15.0000 mL | Freq: Once | OROMUCOSAL | Status: AC

## 2020-11-30 MED ORDER — OXYCODONE HCL 5 MG PO TABS: 5.0000 mg | ORAL_TABLET | Freq: Once | ORAL | Status: DC | PRN

## 2020-11-30 MED ORDER — FENTANYL CITRATE (PF) 250 MCG/5ML IJ SOLN
INTRAMUSCULAR | Status: AC
  Filled 2020-11-30: qty 5

## 2020-11-30 MED ORDER — FENTANYL CITRATE (PF) 100 MCG/2ML IJ SOLN
INTRAMUSCULAR | Status: DC | PRN
  Administered 2020-11-30: 100 ug via INTRAVENOUS

## 2020-11-30 MED ORDER — ONDANSETRON HCL 4 MG/2ML IJ SOLN: 4.0000 mg | Freq: Four times a day (QID) | INTRAMUSCULAR | Status: DC | PRN

## 2020-11-30 MED ORDER — ONDANSETRON 4 MG PO TBDP: 4.0000 mg | ORAL_TABLET | Freq: Four times a day (QID) | ORAL | Status: DC | PRN

## 2020-11-30 MED ORDER — ACETAMINOPHEN 500 MG PO TABS: 1000.0000 mg | ORAL_TABLET | ORAL | Status: AC

## 2020-11-30 MED ORDER — LIDOCAINE 2% (20 MG/ML) 5 ML SYRINGE
INTRAMUSCULAR | Status: AC
  Filled 2020-11-30: qty 5

## 2020-11-30 MED ORDER — OXYCODONE HCL 5 MG/5ML PO SOLN: 5.0000 mg | Freq: Once | ORAL | Status: DC | PRN

## 2020-11-30 MED ORDER — ONDANSETRON HCL 4 MG/2ML IJ SOLN
INTRAMUSCULAR | Status: DC | PRN
  Administered 2020-11-30: 4 mg via INTRAVENOUS

## 2020-11-30 SURGICAL SUPPLY — 60 items
ADH SKN CLS APL DERMABOND .7 (GAUZE/BANDAGES/DRESSINGS) ×2
APL PRP STRL LF DISP 70% ISPRP (MISCELLANEOUS) ×1
APPLIER CLIP 9.375 MED OPEN (MISCELLANEOUS) ×4
APR CLP MED 9.3 20 MLT OPN (MISCELLANEOUS) ×2
BAG COUNTER SPONGE SURGICOUNT (BAG) ×2 IMPLANT
BAG SPNG CNTER NS LX DISP (BAG) ×1
BINDER BREAST LRG (GAUZE/BANDAGES/DRESSINGS) ×1 IMPLANT
BINDER BREAST XLRG (GAUZE/BANDAGES/DRESSINGS) IMPLANT
BIOPATCH RED 1 DISK 7.0 (GAUZE/BANDAGES/DRESSINGS) ×2 IMPLANT
CANISTER SUCT 3000ML PPV (MISCELLANEOUS) ×2 IMPLANT
CHLORAPREP W/TINT 26 (MISCELLANEOUS) ×2 IMPLANT
CLIP APPLIE 9.375 MED OPEN (MISCELLANEOUS) ×1 IMPLANT
CLSR STERI-STRIP ANTIMIC 1/2X4 (GAUZE/BANDAGES/DRESSINGS) ×1 IMPLANT
CNTNR URN SCR LID CUP LEK RST (MISCELLANEOUS) ×1 IMPLANT
CONT SPEC 4OZ STRL OR WHT (MISCELLANEOUS) ×2
COVER PROBE W GEL 5X96 (DRAPES) ×3 IMPLANT
COVER SURGICAL LIGHT HANDLE (MISCELLANEOUS) ×2 IMPLANT
DERMABOND ADVANCED (GAUZE/BANDAGES/DRESSINGS) ×2
DERMABOND ADVANCED .7 DNX12 (GAUZE/BANDAGES/DRESSINGS) ×1 IMPLANT
DRAIN CHANNEL 19F RND (DRAIN) ×2 IMPLANT
DRAPE CHEST BREAST 15X10 FENES (DRAPES) ×2 IMPLANT
DRSG TEGADERM 2-3/8X2-3/4 SM (GAUZE/BANDAGES/DRESSINGS) ×2 IMPLANT
DRSG TEGADERM 4X4.75 (GAUZE/BANDAGES/DRESSINGS) ×1 IMPLANT
ELECT BLADE 4.0 EZ CLEAN MEGAD (MISCELLANEOUS) ×2
ELECT CAUTERY BLADE 6.4 (BLADE) ×2 IMPLANT
ELECT REM PT RETURN 9FT ADLT (ELECTROSURGICAL) ×2
ELECTRODE BLDE 4.0 EZ CLN MEGD (MISCELLANEOUS) ×1 IMPLANT
ELECTRODE REM PT RTRN 9FT ADLT (ELECTROSURGICAL) ×1 IMPLANT
EVACUATOR SILICONE 100CC (DRAIN) ×2 IMPLANT
GLOVE SURG ENC MOIS LTX SZ7 (GLOVE) ×2 IMPLANT
GLOVE SURG UNDER POLY LF SZ7.5 (GLOVE) ×2 IMPLANT
GOWN STRL REUS W/ TWL LRG LVL3 (GOWN DISPOSABLE) ×2 IMPLANT
GOWN STRL REUS W/TWL LRG LVL3 (GOWN DISPOSABLE) ×4
KIT BASIN OR (CUSTOM PROCEDURE TRAY) ×2 IMPLANT
KIT TURNOVER KIT B (KITS) ×2 IMPLANT
LIGHT WAVEGUIDE WIDE FLAT (MISCELLANEOUS) ×1 IMPLANT
NDL 18GX1X1/2 (RX/OR ONLY) (NEEDLE) IMPLANT
NDL FILTER BLUNT 18X1 1/2 (NEEDLE) IMPLANT
NDL HYPO 25GX1X1/2 BEV (NEEDLE) IMPLANT
NEEDLE 18GX1X1/2 (RX/OR ONLY) (NEEDLE) IMPLANT
NEEDLE FILTER BLUNT 18X 1/2SAF (NEEDLE)
NEEDLE FILTER BLUNT 18X1 1/2 (NEEDLE) IMPLANT
NEEDLE HYPO 25GX1X1/2 BEV (NEEDLE) IMPLANT
NS IRRIG 1000ML POUR BTL (IV SOLUTION) ×2 IMPLANT
PACK GENERAL/GYN (CUSTOM PROCEDURE TRAY) ×2 IMPLANT
PAD ABD 8X10 STRL (GAUZE/BANDAGES/DRESSINGS) ×1 IMPLANT
PAD ARMBOARD 7.5X6 YLW CONV (MISCELLANEOUS) ×2 IMPLANT
PENCIL SMOKE EVACUATOR (MISCELLANEOUS) ×2 IMPLANT
PIN SAFETY STERILE (MISCELLANEOUS) ×2 IMPLANT
SPECIMEN JAR X LARGE (MISCELLANEOUS) ×2 IMPLANT
STAPLER VISISTAT 35W (STAPLE) ×2 IMPLANT
SUT ETHILON 2 0 FS 18 (SUTURE) ×2 IMPLANT
SUT MNCRL AB 4-0 PS2 18 (SUTURE) ×5 IMPLANT
SUT SILK 2 0 PERMA HAND 18 BK (SUTURE) ×2 IMPLANT
SUT VIC AB 2-0 SH 18 (SUTURE) ×2 IMPLANT
SUT VIC AB 3-0 SH 8-18 (SUTURE) ×2 IMPLANT
SYR CONTROL 10ML LL (SYRINGE) IMPLANT
TOWEL GREEN STERILE (TOWEL DISPOSABLE) ×2 IMPLANT
TOWEL GREEN STERILE FF (TOWEL DISPOSABLE) ×2 IMPLANT
TRACER MAGTRACE VIAL (MISCELLANEOUS) ×1 IMPLANT

## 2020-11-30 NOTE — Transfer of Care (Signed)
Immediate Anesthesia Transfer of Care Note  Patient: Janet [name]  Procedure(s) Performed: LEFT SIMPLE MASTECTOMY WITH AXILLARY SENTINEL NODE BIOPSY (Left: Breast) LEFT AXILLARY NODE SEED GUIDED EXCISION (Left: Breast)  Patient Location: PACU  Anesthesia Type:General  Level of Consciousness: awake, alert , oriented and patient cooperative  Airway & Oxygen Therapy: Patient Spontanous Breathing  Post-op Assessment: Report given to RN, Post -op Vital signs reviewed and stable and Patient moving all extremities X 4  Post vital signs: Reviewed and stable  Last Vitals:  Vitals Value Taken Time  BP 119/81 11/30/20 1014  Temp    Pulse 82 11/30/20 1016  Resp 12 11/30/20 1016  SpO2 100 % 11/30/20 1016  Vitals shown include unvalidated device data.  Last Pain:  Vitals:   11/30/20 0659  TempSrc:   PainSc: 0-No pain         Complications: No notable events documented.

## 2020-11-30 NOTE — Progress Notes (Signed)
Tracer injection completed by Dr Donne Hazel at [phone].

## 2020-11-30 NOTE — Anesthesia Postprocedure Evaluation (Signed)
Anesthesia Post Note  Patient: PRANAYA CONERLY  Procedure(s) Performed: LEFT SIMPLE MASTECTOMY WITH AXILLARY SENTINEL NODE BIOPSY (Left: Breast) LEFT AXILLARY NODE SEED GUIDED EXCISION (Left: Breast)     Patient location during evaluation: PACU Anesthesia Type: General Level of consciousness: awake and alert and oriented Pain management: pain level controlled Vital Signs Assessment: post-procedure vital signs reviewed and stable Respiratory status: spontaneous breathing, nonlabored ventilation and respiratory function stable Cardiovascular status: blood pressure returned to baseline and stable Postop Assessment: no apparent nausea or vomiting Anesthetic complications: no   No notable events documented.  Last Vitals:  Vitals:   11/30/20 1029 11/30/20 1044  BP: 114/77 109/79  Pulse: 82 83  Resp: 14 17  Temp:    SpO2: 100% 100%    Last Pain:  Vitals:   11/30/20 1044  TempSrc:   PainSc: 0-No pain                 Channie Bostick A.

## 2020-11-30 NOTE — Anesthesia Procedure Notes (Signed)
Anesthesia Regional Block: Pectoralis block   Pre-Anesthetic Checklist: , timeout performed,  Correct Patient, Correct Site, Correct Laterality,  Correct Procedure, Correct Position, site marked,  Risks and benefits discussed,  Surgical consent,  Pre-op evaluation,  At surgeon's request and post-op pain management  Laterality: Left  Prep: chloraprep       Needles:  Injection technique: Single-shot  Needle Type: Echogenic Stimulator Needle     Needle Length: 10cm  Needle Gauge: 21   Needle insertion depth: 7 cm   Additional Needles:   Narrative:  Start time: 11/30/2020 8:17 AM End time: 11/30/2020 8:22 AM Injection made incrementally with aspirations every 5 mL.  Performed by: Personally  Anesthesiologist: Josephine Igo, MD  Additional Notes: Timeout performed. Patient sedated. Relevant anatomy ID'd using [place]. Incremental 2-8m injection of LA with frequent aspiration. Patient tolerated procedure well.    Left Pectoralis Block

## 2020-11-30 NOTE — H&P (Signed)
"83 yof presents with palpable breast mass noted at beginning of year.  she underwent imaging that shows 2 adjacent masses in lateral left breast that are 3.9 and 4 cm by mm.  she has an axillary tail mass that is 2.9 cm.  she has at least 3 by [place] axillary nodes that are enlarged with largest being 2.5 cm.  MRI was done that shows negative right breast,   there are 3.4 and 3.6 cm masses left breast laterally that are really fairly contiguous, 3 cm mass in ax tail and at least 4 enlarged ax nodes present. there are some too small to characterize liver nodules with mr recommended.  biopsy of the im node and ax node are positive. biopsy of the breast lesions are grade III IDC 10% er pos, pr neg, her 2 positive, and Ki is 40%"  She has undergone primary chemotherapy. She has tolerated well. Finished 8/10.   She notes that the areas are smaller. She has repeat MRI that shows a negative right breast. There are no abnormal lymph nodes anywhere. Specifically her left axillary lymph nodes are now normal. On her left breast she has marked decrease in size of the 2 contiguous irregular masses in the upper outer and lower outer quadrant now measuring 1 cm and 1.6 cm. These previously were 3.4 3.6 cm. The third irregular enhancing mass in the left axillary tail is now 1 cm previously was 3 cm. There is a persistent enhancing mass between the 2 masses in the upper outer quadrant measuring 6 mm as well. She is here with her husband to discuss surgery.  Review of Systems: A complete review of systems was obtained from the patient. I have reviewed this information and discussed as appropriate with the patient. See HPI as well for other ROS.  Review of Systems  All other systems reviewed and are negative.   Medical History: Past Medical History:  Diagnosis Date   Anemia   History of cancer   Patient Active Problem List  Diagnosis   Malignant neoplasm of lower-outer quadrant of left breast of female, estrogen  receptor positive (CMS-HCC)   Past Surgical History:  Procedure Laterality Date   ABLATION ARRYTHMIA FOCUS  2017   MASTECTOMY PARTIAL / LUMPECTOMY  2006    No Known Allergies  Current Outpatient Medications on File Prior to Visit  Medication Sig Dispense Refill   dexAMETHasone (DECADRON) 4 MG tablet Take by mouth   loratadine (CLARITIN) 10 mg tablet Take 10 mg by mouth once daily   No current facility-administered medications on file prior to visit.   Family History  Problem Relation Age of Onset   High blood pressure (Hypertension) Mother   Obesity Mother   Diabetes Mother   Obesity Brother   Breast cancer Maternal Aunt    Social History   Tobacco Use  Smoking Status Former Smoker  Smokeless Tobacco Never Used    Social History   Socioeconomic History   Marital status: Married  Tobacco Use   Smoking status: Former Smoker   Smokeless tobacco: Never Used  Substance and Sexual Activity   Alcohol use: Not Currently   Drug use: Not Currently   Objective:    Physical Exam Constitutional:  Appearance: Normal appearance.  Cardiovascular:  Rate and Rhythm: Normal rate.  Pulmonary:  Effort: Pulmonary effort is normal.  Chest:  Breasts:  Right: No mass or nipple discharge.  Left: No nipple discharge.   Comments: Vague mass in the luoq  Lymphadenopathy:  Upper Body:  Right upper body: No supraclavicular or axillary adenopathy.  Left upper body: No supraclavicular or axillary adenopathy.  Neurological:  Mental Status: She is alert.     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 I think for the breast that the best option is still going to be mastectomy. We discussed mastectomy. She does not want to consider doing a reconstruction at the same time. I  discussed a mastectomy with the incision, removal of the nipple areolar complex, drain placement, and an overnight hospital stay as well. We discussed the risks of surgery. She understands that she is going to get recommended radiation postoperatively as well.  Her lymph nodes are completely normal right now. This is both clinically and radiologically. I think it is reasonable to do a targeted lymph node dissection with a seed-guided excision of the prior biopsy node as well as a sentinel lymph node biopsy. We discussed lymphedema risk. We will plan on proceeding about 3 to 4 weeks after she finished chemotherapy.

## 2020-11-30 NOTE — Interval H&P Note (Signed)
History and Physical Interval Note:  11/30/2020 7:32 AM  Janet [name]  has presented today for surgery, with the diagnosis of LEFT BREAST CANCER.  The various methods of treatment have been discussed with the patient and family. After consideration of risks, benefits and other options for treatment, the patient has consented to  Procedure(s): LEFT SIMPLE MASTECTOMY WITH AXILLARY SENTINEL NODE BIOPSY (Left) LEFT AXILLARY NODE SEED GUIDED EXCISION (Left) as a surgical intervention.  The patient's history has been reviewed, patient examined, no change in status, stable for surgery.  I have reviewed the patient's chart and labs.  Questions were answered to the patient's satisfaction.     Rolm Bookbinder

## 2020-11-30 NOTE — Op Note (Signed)
Preoperative diagnosis: Left breast cancer status post primary chemotherapy Postoperative diagnosis: Same as above Procedure: 1.  Left mastectomy 2.  Left radioactive seed guided axillary lymph node excision 3.  Left deep axillary sentinel lymph node biopsy 4.  Injection of mag trace for sentinel lymph node identification Surgeon: Dr. Serita Grammes Anesthesia: General with a pectoral block Estimated blood loss: 25 cc Drains: 19 French Blake drain to mastectomy space Specimens: 1.  Left mastectomy marked short superior, long lateral 2.  Left axillary lymph node containing clip and seed 3. left deep axillary sentinel lymph nodes with highest count of 749 Complications: None 's General counts correct x2 at end of operation Disposition recovery stable condition  Indications: This a 55 year old female presented earlier this year with 2 adjacent lateral left breast masses that measure 3.9 and 4 cm.  She also has an axillary tail mass that is 2.9 cm.  She had at least 4 enlarged axillary nodes on MRI.  The MRI showed two 3.4 and 3.6 cm left breast masses that are fairly contiguous as well as a 3 cm mass in the axillary tail.  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  She is undergone primary chemotherapy which she tolerated well.  Repeat MRI shows no abnormal lymph nodes and the left breast is now decreased to 2 masses that measure 1 cm and 1.6 cm.  The left axillary tail mass is now 1 cm.  There is some persistent enhancement in between the masses and due to this I discussed I think the only real reasonable local therapy would be a mastectomy.  We discussed a targeted axillary dissection as well.  She declined reconstruction.  Procedure: After informed consent was obtained the patient was taken the operating room.  She underwent a pectoral block.  I injected 3 cc of mag trace in the subareolar position prior to beginning as well.  She was  then placed under general anesthesia.  She was given antibiotics.  SCDs were in place.  She was prepped and draped in the standard sterile surgical fashion.  Surgical timeout was then performed.  I made a incision in a reduction pattern that encompassed the nipple and areola in order to reduce the amount of skin.  I then created flaps in the clavicle, parasternal area, inframammary fold, latissimus laterally.  I then remove the breast and the pectoralis fashion and marked it as above.  The low-lying axillary node was identified to be in the axillary tail and I excised this separately.  Mammogram confirmed removal of the clip and the seed as the prior positive node.  I then used the center mag probe to identify what appeared to be several other sentinel lymph nodes that were not enlarged.  The highest count is listed as above.  There was no background activity.  Hemostasis was then obtained.  I placed a 26 [place] Blake drain and secured this with a 2-0 nylon.  I placed some Arista in the cavity.  I then closed this with 3-0 Vicryl.  4 Monocryl and glue were used to close the skin.  She tolerated this well was extubated transferred to recovery stable.

## 2020-11-30 NOTE — Anesthesia Procedure Notes (Signed)
Procedure Name: LMA Insertion Date/Time: 11/30/2020 8:41 AM Performed by: Jonna Munro, CRNA Pre-anesthesia Checklist: Patient identified, Emergency Drugs available, Suction available, Patient being monitored and Timeout performed Patient Re-evaluated:Patient Re-evaluated prior to induction Oxygen Delivery Method: Circle system utilized Preoxygenation: Pre-oxygenation with 100% oxygen Induction Type: IV induction LMA: LMA inserted LMA Size: 4.0 Number of attempts: 1 Placement Confirmation: positive ETCO2 and breath sounds checked- equal and bilateral Tube secured with: Tape Dental Injury: Teeth and Oropharynx as per pre-operative assessment

## 2020-12-01 ENCOUNTER — Encounter (HOSPITAL_COMMUNITY): Payer: Self-pay | Admitting: General Surgery

## 2020-12-01 DIAGNOSIS — C50512 Malignant neoplasm of lower-outer quadrant of left female breast: Secondary | ICD-10-CM | POA: Diagnosis not present

## 2020-12-01 MED ORDER — OXYCODONE HCL 5 MG PO TABS: 5.0000 mg | ORAL_TABLET | ORAL | 0 refills | Status: DC | PRN

## 2020-12-01 NOTE — Discharge Instructions (Signed)
[facility], [place] [phone]  MASTECTOMY: POST OP INSTRUCTIONS Take 400 mg of ibuprofen every 8 hours or 650 mg tylenol every 6 hours for next 72 hours then as needed. Use ice several times daily also. Always review your discharge instruction sheet given to you by the facility where your surgery was performed. IF YOU HAVE DISABILITY OR FAMILY LEAVE FORMS, YOU MUST BRING THEM TO THE OFFICE FOR PROCESSING.   DO NOT GIVE THEM TO YOUR DOCTOR. A prescription for pain medication may be given to you upon discharge.  Take your pain medication as prescribed, if needed.  If narcotic pain medicine is not needed, then you may take acetaminophen (Tylenol), naprosyn (Alleve) or ibuprofen (Advil) as needed. Take your usually prescribed medications unless otherwise directed. If you need a refill on your pain medication, please contact your pharmacy.  They will contact our office to request authorization.  Prescriptions will not be filled after 5pm or on week-ends. You should follow a light diet the first few days after arrival home, such as soup and crackers, etc.  Resume your normal diet the day after surgery. Most patients will experience some swelling and bruising on the chest and underarm.  Ice packs will help.  Swelling and bruising can take several days to resolve. Wear the binder day and night until you return to the office.  It is common to experience some constipation if taking pain medication after surgery.  Increasing fluid intake and taking a stool softener (such as Colace) will usually help or prevent this problem from occurring.  A mild laxative (Milk of Magnesia or Miralax) should be taken according to package instructions if there are no bowel movements after 48 hours. Unless discharge instructions indicate otherwise, leave your bandage dry and in place until your next appointment in 3-5 days.  You may take a limited sponge bath.  No tube baths or showers until the drains are removed.   You may have steri-strips (small skin tapes) in place directly over the incision.  These strips should be left on the skin for 7-10 days. If you have glue it will come off in next couple week.  Any sutures will be removed at an office visit DRAINS:  If you have drains in place, it is important to keep a list of the amount of drainage produced each day in your drains.  Before leaving the hospital, you should be instructed on drain care.  Call our office if you have any questions about your drains. I will remove your drains when they put out less than 30 cc or ml for 2 consecutive days. ACTIVITIES:  You may resume regular (light) daily activities beginning the next day--such as daily self-care, walking, climbing stairs--gradually increasing activities as tolerated.  You may have sexual intercourse when it is comfortable.  Refrain from any heavy lifting or straining until approved by your doctor. You may drive when you are no longer taking prescription pain medication, you can comfortably wear a seatbelt, and you can safely maneuver your car and apply brakes. RETURN TO WORK:  __________________________________________________________ Janet [name] should see your doctor in the office for a follow-up appointment approximately 3-5 days after your surgery.  Your doctor's nurse will typically make your follow-up appointment when she calls you with your pathology report.  Expect your pathology report 3-4business days after surgery. OTHER INSTRUCTIONS: ______________________________________________________________________________________________ ____________________________________________________________________________________________ WHEN TO CALL YOUR DR Janet [name]: Fever over 101.0 Nausea and/or vomiting Extreme swelling or bruising Continued bleeding from incision. Increased pain, redness,  or drainage from the incision. The clinic staff is available to answer your questions during regular business hours.  Please don't  hesitate to call and ask to speak to one of the nurses for clinical concerns.  If you have a medical emergency, go to the nearest emergency room or call 911.  A surgeon from [facility] is always on call at the hospital. [address] [place], [place], [place]  [address] ? [address], [place], [place]   [address] [phone] ? [phone] ? FAX [phone] Web site: [email or web address]

## 2020-12-01 NOTE — Progress Notes (Signed)
Pt discharged home in stable condition after going over discharge teaching with no concerns voiced 

## 2020-12-02 NOTE — Discharge Summary (Signed)
Physician Discharge Summary  Patient ID: Janet [name] MRN: [id number] DOB/AGE: Dec 17, 1965 55 y.o.  Admit date: 11/30/2020 Discharge date: 12/02/2020  Admission Diagnoses: Left breast cancer s/p primary chemotherapy, node positive  Discharge Diagnoses:  Active Problems:   S/P mastectomy, right   S/P mastectomy, left   Discharged Condition: good  Hospital Course: 55 yof s/p primary chemotherapy then underwent left mastectomy, TAD.  Doing well following am and will be discharged  Consults: None  Significant Diagnostic Studies: none  Treatments: surgery: left mastectomy, left TAD  Discharge Exam: Blood pressure (!) 97/54, pulse 93, temperature 97.7 F (36.5 C), temperature source Oral, resp. rate 18, height '5\' 9"'$  (1.753 m), weight 65.8 kg, last menstrual period 01/09/2016, SpO2 100 %. Flaps viable, no hematoma, drain serosang as expected  Disposition: Discharge disposition: 01-Home or Self Care        Allergies as of 12/01/2020   No Known Allergies      Medication List     TAKE these medications    dexamethasone 4 MG tablet Commonly known as: DECADRON Take 1 tablet (4 mg total) by mouth daily. Take 1 tablet day before chemo and 1 tablet day after chemo with food   lidocaine-prilocaine cream Commonly known as: EMLA Apply to affected area once   ondansetron 8 MG tablet Commonly known as: Zofran Take 1 tablet (8 mg total) by mouth 2 (two) times daily as needed (Nausea or vomiting). Start on the third day after chemotherapy.   oxyCODONE 5 MG immediate release tablet Commonly known as: Oxy IR/ROXICODONE Take 1 tablet (5 mg total) by mouth every 4 (four) hours as needed for moderate pain.   prochlorperazine 10 MG tablet Commonly known as: COMPAZINE Take 1 tablet (10 mg total) by mouth every 6 (six) hours as needed (Nausea or vomiting).        Follow-up Information     Rolm Bookbinder, MD Follow up in 2 week(s).   Specialty: General Surgery Contact  information: [address] [place] [address] [phone]                 Signed: Rolm Bookbinder 12/02/2020, 10:53 AM

## 2020-12-07 NOTE — Progress Notes (Signed)
Patient Care Team: Charlynn Court, NP as PCP - General (Nurse Practitioner) Rockwell Germany, RN as Oncology Nurse Navigator Mauro Kaufmann, RN as Oncology Nurse Navigator  DIAGNOSIS:    ICD-10-CM   1. Malignant neoplasm of lower-outer quadrant of left breast of female, estrogen receptor positive ([place])  C50.512    Z17.0       SUMMARY OF ONCOLOGIC HISTORY: Oncology History  Malignant neoplasm of lower-outer quadrant of left breast of female, estrogen receptor positive ([place])  06/23/2020 Initial Diagnosis   Normal mammogram September 2021.  December 2021: Following trauma to the breast she felt a lump but for variety of reasons work-up was postponed.  Mammogram revealed 2 tumors measuring 7 cm individually 4 cm and 3 cm, 3 axillary lymph nodes, biopsy revealed grade 3 IDC ER 10%, PR 0%, Ki-67 40%, HER-2 +3+ by IHC, lymph node also positive   06/28/2020 Cancer Staging   Staging form: Breast, AJCC 8th Edition - Clinical stage from 06/28/2020: Stage IIB (cT2, cN1, cM0, G3, ER+, PR-, HER2+) - Signed by Nicholas Lose, MD on 06/28/2020 Stage prefix: Initial diagnosis Histologic grading system: 3 grade system   07/13/2020 -  Chemotherapy    Patient is on Treatment Plan: BREAST  DOCETAXEL + CARBOPLATIN + TRASTUZUMAB + PERTUZUMAB  (TCHP) Q21D        11/30/2020 Surgery   left mastectomy: Residual IDC 0.9 cm, focal high-grade DCIS, 5/12 lymph nodes are positive including 1 micromet, margins negative, ER 10%, PR 0%, HER2 3+, Ki-67 40%     CHIEF COMPLIANT: Follow-up after surgery  INTERVAL HISTORY: Janet [name] is a 55 y.o. with above-mentioned history of HER-2 positive breast cancer having completed neoadjuvant chemotherapy with [place]. Left mastectomy 11/30/2020 with Dr. Rolm Bookbinder showed residual invasive ductal carcinoma with 5 left axillary lymph nodes positive for metastatic carcinoma. She presents to the clinic today for follow-up.  She is healing and recovering very well  from recent surgery.  ALLERGIES:  has No Known Allergies.  MEDICATIONS:  Current Outpatient Medications  Medication Sig Dispense Refill   dexamethasone (DECADRON) 4 MG tablet Take 1 tablet (4 mg total) by mouth daily. Take 1 tablet day before chemo and 1 tablet day after chemo with food 12 tablet 0   lidocaine-prilocaine (EMLA) cream Apply to affected area once 30 g 3   ondansetron (ZOFRAN) 8 MG tablet Take 1 tablet (8 mg total) by mouth 2 (two) times daily as needed (Nausea or vomiting). Start on the third day after chemotherapy. 30 tablet 1   oxyCODONE (OXY IR/ROXICODONE) 5 MG immediate release tablet Take 1 tablet (5 mg total) by mouth every 4 (four) hours as needed for moderate pain. 10 tablet 0   prochlorperazine (COMPAZINE) 10 MG tablet Take 1 tablet (10 mg total) by mouth every 6 (six) hours as needed (Nausea or vomiting). 30 tablet 1   No current facility-administered medications for this visit.    PHYSICAL EXAMINATION: ECOG PERFORMANCE STATUS: 1 - Symptomatic but completely ambulatory  Vitals:   12/08/20 1326  BP: 119/71  Pulse: 92  Resp: 18  Temp: (!) 97.5 F (36.4 C)  SpO2: 100%   Filed Weights   12/08/20 1326  Weight: 148 lb (67.1 kg)      LABORATORY DATA:  I have reviewed the data as listed CMP Latest Ref Rng & Units 11/30/2020 10/26/2020 10/05/2020  Glucose 70 - 99 mg/dL 100(H) 91 91  BUN 6 - 20 mg/dL $Remove'9 15 13  'BJDELBm$ Creatinine 0.44 -  1.00 mg/dL 0.68 0.67 0.61  Sodium 135 - 145 mmol/L 140 141 143  Potassium 3.5 - 5.1 mmol/L 3.6 3.5 3.5  Chloride 98 - 111 mmol/L 105 108 105  CO2 22 - 32 mmol/L $RemoveB'30 25 30  'CnRaDZLs$ Calcium 8.9 - 10.3 mg/dL 9.1 9.4 9.4  Total Protein 6.5 - 8.1 g/dL - 6.6 6.9  Total Bilirubin 0.3 - 1.2 mg/dL - 0.5 0.5  Alkaline Phos 38 - 126 U/L - 85 83  AST 15 - 41 U/L - 11(L) 12(L)  ALT 0 - 44 U/L - 10 13    Lab Results  Component Value Date   WBC 4.4 11/30/2020   HGB 11.9 (L) 11/30/2020   HCT 36.3 11/30/2020   MCV 102.3 (H) 11/30/2020   PLT 228  11/30/2020   NEUTROABS 3.6 10/26/2020    ASSESSMENT & PLAN:  Malignant neoplasm of lower-outer quadrant of left breast of female, estrogen receptor positive ([place]) 06/23/2020: Normal mammogram September 2021.  December 2021: Following trauma to the breast she felt a lump but for variety of reasons work-up was postponed.  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   Treatment Plan: 1. Neoadjuvant chemotherapy with TCH [place] 6 cycles followed by Kadcyla maintenance (because of residual disease after neoadjuvant chemo) for 1 year 2. left mastectomy 11/30/2020: Residual IDC 0.9 cm, focal high-grade DCIS, 5/12 lymph nodes are positive including 1 micromet, margins negative, ER 10%, PR 0%, HER2 3+, Ki-67 40% 3. Followed by adjuvant radiation therapy 4.  Followed by antiestrogen therapy 5.  Followed by neratinib MRI liver 07/11/20: Hepatic cysts Bone scan and CT CAP: No mets Breast MRI 06/28/2020: 2 large left breast masses each 0.5 cm, overall 7 cm, third contiguous mass 3 cm (could be a lymph node, abutting pectoralis muscle), 4 lymph nodes left axilla ---------------------------------------------------------------------------------------------------------------------------- Pathology counseling: I discussed the final pathology report of the patient provided  a copy of this report. I discussed the margins.  We also discussed the final staging along with previously performed ER/PR testing.  Treatment plan: Maintenance therapy with Kadcyla Adjuvant radiation therapy. Return to clinic every 3 weeks for Kadcyla    No orders of the defined types were placed in this encounter.  The patient has a good understanding of the overall plan. she agrees with it. she will call with any problems that may develop before the next visit here.  Total time spent: 30  mins including face to face time and time spent for planning, charting and coordination of care  Rulon Eisenmenger, MD, MPH 12/08/2020  I, Thana Ates, am acting as scribe for Dr. Nicholas Lose.  I have reviewed the above documentation for accuracy and completeness, and I agree with the above.

## 2020-12-08 ENCOUNTER — Encounter: Payer: Self-pay | Admitting: *Deleted

## 2020-12-08 ENCOUNTER — Inpatient Hospital Stay: Payer: 59 | Attending: Hematology and Oncology | Admitting: Hematology and Oncology

## 2020-12-08 ENCOUNTER — Other Ambulatory Visit: Payer: Self-pay

## 2020-12-08 VITALS — BP 119/71 | HR 92 | Temp 97.5°F | Resp 18 | Ht 69.0 in | Wt 148.0 lb

## 2020-12-08 DIAGNOSIS — C50512 Malignant neoplasm of lower-outer quadrant of left female breast: Secondary | ICD-10-CM

## 2020-12-08 DIAGNOSIS — Z170 Estrogen receptor positive status [ER+]: Secondary | ICD-10-CM

## 2020-12-08 DIAGNOSIS — Z5112 Encounter for antineoplastic immunotherapy: Secondary | ICD-10-CM | POA: Diagnosis present

## 2020-12-08 DIAGNOSIS — C773 Secondary and unspecified malignant neoplasm of axilla and upper limb lymph nodes: Secondary | ICD-10-CM | POA: Insufficient documentation

## 2020-12-08 DIAGNOSIS — Z9012 Acquired absence of left breast and nipple: Secondary | ICD-10-CM | POA: Insufficient documentation

## 2020-12-08 NOTE — Assessment & Plan Note (Addendum)
06/23/2020:Normal mammogram September 2021. December 2021: Following trauma to the breast she felt a lump but for variety of reasons work-up was postponed. 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  Treatment Plan: 1. Neoadjuvant chemotherapy with TCH [place] 6 cycles followed by Kadcyla maintenance (because of residual disease after neoadjuvant chemo) for 1 year 2. left mastectomy 11/30/2020: Residual IDC 0.9 cm, focal high-grade DCIS, 5/12 lymph nodes are positive including 1 micromet, margins negative, ER 10%, PR 0%, HER2 3+, Ki-67 40% 3. Followed by adjuvant radiation therapy 4.Followed by antiestrogen therapy 5.Followed by neratinib MRI liver 07/11/20: Hepatic cysts Bone scan and CT CAP: No mets Breast MRI 06/28/2020: 2 large left breast masses each 0.5 cm, overall 7 cm, third contiguous mass 3 cm (could be a lymph node, abutting pectoralis muscle), 4 lymph nodes left axilla ---------------------------------------------------------------------------------------------------------------------------- Pathology counseling: I discussed the final pathology report of the patient provided  a copy of this report. I discussed the margins.  We also discussed the final staging along with previously performed ER/PR testing.  Treatment plan: Maintenance therapy with Kadcyla Adjuvant radiation therapy. Return to clinic every 3 weeks for Kadcyla

## 2020-12-08 NOTE — Progress Notes (Signed)
DISCONTINUE ON PATHWAY REGIMEN - Breast     A cycle is every 21 days:     Pertuzumab      Pertuzumab      Trastuzumab-xxxx      Trastuzumab-xxxx      Carboplatin      Docetaxel   **Always confirm dose/schedule in your pharmacy ordering system**  REASON: Other Reason PRIOR TREATMENT: [id number]: Docetaxel + Carboplatin + Trastuzumab IV + Pertuzumab IV (TCHP IV) q21 Days x 6 Cycles TREATMENT RESPONSE: Partial Response (PR)  START ON PATHWAY REGIMEN - Breast     A cycle is every 21 days:     Ado-trastuzumab emtansine   **Always confirm dose/schedule in your pharmacy ordering system**  Patient Characteristics: Post-Neoadjuvant Therapy and Resection, HER2 Positive, ER Positive, Residual Disease, Adjuvant Targeted Therapy After Neoadjuvant Chemo/Targeted Therapy Therapeutic Status: Post-Neoadjuvant Therapy and Resection Residual Invasive Disease Post-Neoadjuvant Therapy<= Yes ER Status: Positive (+) HER2 Status: Positive (+) PR Status: Negative (-) Intent of Therapy: Curative Intent, Discussed with Patient 

## 2020-12-12 ENCOUNTER — Encounter: Payer: Self-pay | Admitting: *Deleted

## 2020-12-14 NOTE — Progress Notes (Signed)
Patient Care Team: Charlynn Court, NP as PCP - General (Nurse Practitioner) Rockwell Germany, RN as Oncology Nurse Navigator Mauro Kaufmann, RN as Oncology Nurse Navigator  DIAGNOSIS:    ICD-10-CM   1. Malignant neoplasm of lower-outer quadrant of left breast of female, estrogen receptor positive ([place])  C50.512    Z17.0       SUMMARY OF ONCOLOGIC HISTORY: Oncology History  Malignant neoplasm of lower-outer quadrant of left breast of female, estrogen receptor positive ([place])  06/23/2020 Initial Diagnosis   Normal mammogram September 2021.  December 2021: Following trauma to the breast she felt a lump but for variety of reasons work-up was postponed.  Mammogram revealed 2 tumors measuring 7 cm individually 4 cm and 3 cm, 3 axillary lymph nodes, biopsy revealed grade 3 IDC ER 10%, PR 0%, Ki-67 40%, HER-2 +3+ by IHC, lymph node also positive   06/28/2020 Cancer Staging   Staging form: Breast, AJCC 8th Edition - Clinical stage from 06/28/2020: Stage IIB (cT2, cN1, cM0, G3, ER+, PR-, HER2+) - Signed by Nicholas Lose, MD on 06/28/2020 Stage prefix: Initial diagnosis Histologic grading system: 3 grade system   07/13/2020 - 11/16/2020 Chemotherapy   TCH Perjeta   11/30/2020 Surgery   left mastectomy: Residual IDC 0.9 cm, focal high-grade DCIS, 5/12 lymph nodes are positive including 1 micromet, margins negative, ER 10%, PR 0%, HER2 3+, Ki-67 40%   12/15/2020 -  Chemotherapy   Patient is on Treatment Plan : BREAST ADO-Trastuzumab Emtansine (Kadcyla) q21d       CHIEF COMPLIANT: Cycle 1 Kadcyla  INTERVAL HISTORY: Janet [name] is a 55 y.o. with above-mentioned history of HER-2 positive breast cancer having completed neoadjuvant chemotherapy with Ballinger Perjeta and left mastectomy, to start cycle 1 Kadcyla. She presents to the clinic today for treatment.   She denies any pain or discomfort.  ALLERGIES:  has No Known Allergies.  MEDICATIONS:  Current Outpatient Medications  Medication Sig  Dispense Refill   oxyCODONE (OXY IR/ROXICODONE) 5 MG immediate release tablet Take 1 tablet (5 mg total) by mouth every 4 (four) hours as needed for moderate pain. 10 tablet 0   No current facility-administered medications for this visit.    PHYSICAL EXAMINATION: ECOG PERFORMANCE STATUS: 1 - Symptomatic but completely ambulatory  Vitals:   12/15/20 1056  BP: 116/78  Pulse: 73  Resp: 18  Temp: (!) 97.3 F (36.3 C)  SpO2: 100%   Filed Weights   12/15/20 1056  Weight: 146 lb 12.8 oz (66.6 kg)    LABORATORY DATA:  I have reviewed the data as listed CMP Latest Ref Rng & Units 11/30/2020 10/26/2020 10/05/2020  Glucose 70 - 99 mg/dL 100(H) 91 91  BUN 6 - 20 mg/dL $Remove'9 15 13  'tnrqefn$ Creatinine 0.44 - 1.00 mg/dL 0.68 0.67 0.61  Sodium 135 - 145 mmol/L 140 141 143  Potassium 3.5 - 5.1 mmol/L 3.6 3.5 3.5  Chloride 98 - 111 mmol/L 105 108 105  CO2 22 - 32 mmol/L $RemoveB'30 25 30  'zizLPXRd$ Calcium 8.9 - 10.3 mg/dL 9.1 9.4 9.4  Total Protein 6.5 - 8.1 g/dL - 6.6 6.9  Total Bilirubin 0.3 - 1.2 mg/dL - 0.5 0.5  Alkaline Phos 38 - 126 U/L - 85 83  AST 15 - 41 U/L - 11(L) 12(L)  ALT 0 - 44 U/L - 10 13    Lab Results  Component Value Date   WBC 7.5 12/15/2020   HGB 11.6 (L) 12/15/2020   HCT 35.3 (L)  12/15/2020   MCV 99.7 12/15/2020   PLT 262 12/15/2020   NEUTROABS 4.5 12/15/2020    ASSESSMENT & PLAN:  Malignant neoplasm of lower-outer quadrant of left breast of female, estrogen receptor positive ([place]) 06/23/2020: Normal mammogram September 2021.  December 2021: Following trauma to the breast she felt a lump but for variety of reasons work-up was postponed.  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   Treatment Plan: 1. Neoadjuvant chemotherapy with TCH Perjeta 6 cycles followed by Kadcyla maintenance (because of residual disease after neoadjuvant chemo) for 1  year 2. left mastectomy 11/30/2020: Residual IDC 0.9 cm, focal high-grade DCIS, 5/12 lymph nodes are positive including 1 micromet, margins negative, ER 10%, PR 0%, HER2 3+, Ki-67 40% 3. Followed by adjuvant radiation therapy 4.  Followed by antiestrogen therapy 5.  Followed by neratinib MRI liver 07/11/20: Hepatic cysts Bone scan and CT CAP: No mets Breast MRI 06/28/2020: 2 large left breast masses each 0.5 cm, overall 7 cm, third contiguous mass 3 cm (could be a lymph node, abutting pectoralis muscle), 4 lymph nodes left axilla ---------------------------------------------------------------------------------------------------------------------------- Treatment plan: Maintenance therapy with Kadcyla, today is cycle 1 (until 07/13/2021)   Adjuvant radiation therapy: We will be starting soon I sent a message to Dr. Melina Copa with pathology to review her final path and determine her residual cancer burden.  We will discuss this when we meet her At the next visit. Return to clinic every 3 weeks for Kadcyla    No orders of the defined types were placed in this encounter.  The patient has a good understanding of the overall plan. she agrees with it. she will call with any problems that may develop before the next visit here.  Total time spent: 30 mins including face to face time and time spent for planning, charting and coordination of care  Rulon Eisenmenger, MD, MPH 12/15/2020  I, Thana Ates, am acting as scribe for Dr. Nicholas Lose.  I have reviewed the above documentation for accuracy and completeness, and I agree with the above.

## 2020-12-14 NOTE — Progress Notes (Signed)
Pharmacist Chemotherapy Monitoring - Initial Assessment    Anticipated start date: 12/15/20   The following has been reviewed per standard work regarding the patient's treatment regimen: The patient's diagnosis, treatment plan and drug doses, and organ/hematologic function Lab orders and baseline tests specific to treatment regimen  The treatment plan start date, drug sequencing, and pre-medications Prior authorization status  Patient's documented medication list, including drug-drug interaction screen and prescriptions for anti-emetics and supportive care specific to the treatment regimen The drug concentrations, fluid compatibility, administration routes, and timing of the medications to be used The patient's access for treatment and lifetime cumulative dose history, if applicable  The patient's medication allergies and previous infusion related reactions, if applicable   Changes made to treatment plan:  treatment plan date  Follow up needed:  prescriptions needed for anti-emetics    Kennith Center, Pharm.D., CPP 12/14/2020@1 :11 PM

## 2020-12-15 ENCOUNTER — Other Ambulatory Visit: Payer: Self-pay

## 2020-12-15 ENCOUNTER — Inpatient Hospital Stay: Payer: 59

## 2020-12-15 ENCOUNTER — Inpatient Hospital Stay (HOSPITAL_BASED_OUTPATIENT_CLINIC_OR_DEPARTMENT_OTHER): Payer: 59 | Admitting: Hematology and Oncology

## 2020-12-15 DIAGNOSIS — C50512 Malignant neoplasm of lower-outer quadrant of left female breast: Secondary | ICD-10-CM | POA: Diagnosis not present

## 2020-12-15 DIAGNOSIS — Z170 Estrogen receptor positive status [ER+]: Secondary | ICD-10-CM | POA: Diagnosis not present

## 2020-12-15 DIAGNOSIS — Z95828 Presence of other vascular implants and grafts: Secondary | ICD-10-CM

## 2020-12-15 DIAGNOSIS — Z5112 Encounter for antineoplastic immunotherapy: Secondary | ICD-10-CM | POA: Diagnosis not present

## 2020-12-15 LAB — CBC WITH DIFFERENTIAL (CANCER CENTER ONLY)
Abs Immature Granulocytes: 0.02 10*3/uL (ref 0.00–0.07)
Basophils Absolute: 0.1 10*3/uL (ref 0.0–0.1)
Basophils Relative: 1 %
Eosinophils Absolute: 0.7 10*3/uL — ABNORMAL HIGH (ref 0.0–0.5)
Eosinophils Relative: 9 %
HCT: 35.3 % — ABNORMAL LOW (ref 36.0–46.0)
Hemoglobin: 11.6 g/dL — ABNORMAL LOW (ref 12.0–15.0)
Immature Granulocytes: 0 %
Lymphocytes Relative: 24 %
Lymphs Abs: 1.8 10*3/uL (ref 0.7–4.0)
MCH: 32.8 pg (ref 26.0–34.0)
MCHC: 32.9 g/dL (ref 30.0–36.0)
MCV: 99.7 fL (ref 80.0–100.0)
Monocytes Absolute: 0.5 10*3/uL (ref 0.1–1.0)
Monocytes Relative: 6 %
Neutro Abs: 4.5 10*3/uL (ref 1.7–7.7)
Neutrophils Relative %: 60 %
Platelet Count: 262 10*3/uL (ref 150–400)
RBC: 3.54 MIL/uL — ABNORMAL LOW (ref 3.87–5.11)
RDW: 13 % (ref 11.5–15.5)
WBC Count: 7.5 10*3/uL (ref 4.0–10.5)
nRBC: 0 % (ref 0.0–0.2)

## 2020-12-15 LAB — CMP (CANCER CENTER ONLY)
ALT: 13 U/L (ref 0–44)
AST: 15 U/L (ref 15–41)
Albumin: 3.7 g/dL (ref 3.5–5.0)
Alkaline Phosphatase: 76 U/L (ref 38–126)
Anion gap: 9 (ref 5–15)
BUN: 15 mg/dL (ref 6–20)
CO2: 26 mmol/L (ref 22–32)
Calcium: 9.1 mg/dL (ref 8.9–10.3)
Chloride: 107 mmol/L (ref 98–111)
Creatinine: 0.71 mg/dL (ref 0.44–1.00)
GFR, Estimated: >60 mL/min (ref >60)
Glucose, Bld: 93 mg/dL (ref 70–99)
Potassium: 4.1 mmol/L (ref 3.5–5.1)
Sodium: 142 mmol/L (ref 135–145)
Total Bilirubin: 0.4 mg/dL (ref 0.3–1.2)
Total Protein: 6.7 g/dL (ref 6.5–8.1)

## 2020-12-15 MED ORDER — SODIUM CHLORIDE 0.9% FLUSH
10.0000 mL | INTRAVENOUS | Status: DC | PRN
  Administered 2020-12-15: 10 mL

## 2020-12-15 MED ORDER — ACETAMINOPHEN 325 MG PO TABS
650.0000 mg | ORAL_TABLET | Freq: Once | ORAL | Status: AC
Start: 2020-12-15 — End: 2020-12-15
  Administered 2020-12-15: 650 mg via ORAL
  Filled 2020-12-15: qty 2

## 2020-12-15 MED ORDER — SODIUM CHLORIDE 0.9 % IV SOLN
3.6000 mg/kg | Freq: Once | INTRAVENOUS | Status: AC
  Administered 2020-12-15: 240 mg via INTRAVENOUS
  Filled 2020-12-15: qty 8

## 2020-12-15 MED ORDER — DIPHENHYDRAMINE HCL 25 MG PO CAPS
25.0000 mg | ORAL_CAPSULE | Freq: Once | ORAL | Status: AC
  Administered 2020-12-15: 25 mg via ORAL
  Filled 2020-12-15: qty 1

## 2020-12-15 MED ORDER — HEPARIN SOD (PORK) LOCK FLUSH 100 UNIT/ML IV SOLN
500.0000 [IU] | Freq: Once | INTRAVENOUS | Status: AC | PRN
  Administered 2020-12-15: 500 [IU]

## 2020-12-15 MED ORDER — SODIUM CHLORIDE 0.9 % IV SOLN: Freq: Once | INTRAVENOUS | Status: AC

## 2020-12-15 NOTE — Assessment & Plan Note (Signed)
06/23/2020:Normal mammogram September 2021. December 2021: Following trauma to the breast she felt a lump but for variety of reasons work-up was postponed. 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  Treatment Plan: 1. Neoadjuvant chemotherapy with TCH Perjeta 6 cycles followed by Kadcyla maintenance (because of residual disease after neoadjuvant chemo) for 1 year 2. left mastectomy 11/30/2020: Residual IDC 0.9 cm, focal high-grade DCIS, 5/12 lymph nodes are positive including 1 micromet, margins negative, ER 10%, PR 0%, HER2 3+, Ki-67 40% 3. Followed by adjuvant radiation therapy 4.Followed by antiestrogen therapy 5.Followed by neratinib MRI liver 07/11/20: Hepatic cysts Bone scan and CT CAP: No mets Breast MRI 06/28/2020: 2 large left breast masses each 0.5 cm, overall 7 cm, third contiguous mass 3 cm (could be a lymph node, abutting pectoralis muscle), 4 lymph nodes left axilla ---------------------------------------------------------------------------------------------------------------------------- Treatment plan: Maintenance therapy with Kadcyla Kadcyla toxicities: No major side effects to Kadcyla Adjuvant radiation therapy. Return to clinic every 3 weeks for Kadcyla

## 2020-12-15 NOTE — Patient Instructions (Signed)
[facility] MEDICAL [facility]COLOGY  Discharge Instructions: °Thank you for choosing [facility] to provide your oncology and hematology care.  ° °If you have a lab appointment with the Cancer Center, please go directly to the Cancer Center and check in at the registration area. °  °Wear comfortable clothing and clothing appropriate for easy access to any Portacath or PICC line.  ° °We strive to give you quality time with your provider. You may need to reschedule your appointment if you arrive late (15 or more minutes).  Arriving late affects you and other patients whose appointments are after yours.  Also, if you miss three or more appointments without notifying the office, you may be dismissed from the clinic at the provider’s discretion.    °  °For prescription refill requests, have your pharmacy contact our office and allow 72 hours for refills to be completed.   ° °Today you received the following chemotherapy and/or immunotherapy agents Kadcyla    °  °To help prevent nausea and vomiting after your treatment, we encourage you to take your nausea medication as directed. ° °BELOW ARE SYMPTOMS THAT SHOULD BE REPORTED IMMEDIATELY: °*FEVER GREATER THAN 100.4 F (38 °C) OR HIGHER °*CHILLS OR SWEATING °*NAUSEA AND VOMITING THAT IS NOT CONTROLLED WITH YOUR NAUSEA MEDICATION °*UNUSUAL SHORTNESS OF BREATH °*UNUSUAL BRUISING OR BLEEDING °*URINARY PROBLEMS (pain or burning when urinating, or frequent urination) °*BOWEL PROBLEMS (unusual diarrhea, constipation, pain near the anus) °TENDERNESS IN MOUTH AND THROAT WITH OR WITHOUT PRESENCE OF ULCERS (sore throat, sores in mouth, or a toothache) °UNUSUAL RASH, SWELLING OR PAIN  °UNUSUAL VAGINAL DISCHARGE OR ITCHING  ° °Items with * indicate a potential emergency and should be followed up as soon as possible or go to the Emergency Department if any problems should occur. ° °Please show the CHEMOTHERAPY ALERT CARD or IMMUNOTHERAPY ALERT CARD at check-in to the  Emergency Department and triage nurse. ° °Should you have questions after your visit or need to cancel or reschedule your appointment, please contact [facility]  Dept: [phone]  and follow the prompts.  Office hours are 8:00 a.m. to 4:30 p.m. Monday - Friday. Please note that voicemails left after 4:00 p.m. may not be returned until the following business day.  We are closed weekends and major holidays. You have access to a nurse at all times for urgent questions. Please call the main number to the clinic Dept: [phone] and follow the prompts. ° ° °For any non-urgent questions, you may also contact your provider using MyChart. We now offer e-Visits for anyone 18 and older to request care online for non-urgent symptoms. For details visit [email or web address]. °  °Also download the MyChart app! Go to the app store, search "MyChart", open the app, select [facility], and log in with your MyChart username and password. ° °Due to Covid, a mask is required upon entering the hospital/clinic. If you do not have a mask, one will be given to you upon arrival. For doctor visits, patients may have 1 support person aged 18 or older with them. For treatment visits, patients cannot have anyone with them due to current Covid guidelines and our immunocompromised population.  ° °

## 2020-12-23 ENCOUNTER — Encounter: Payer: Self-pay | Admitting: Radiation Oncology

## 2020-12-23 NOTE — Progress Notes (Signed)
Location of Breast Cancer:  Malignant neoplasm of lower-outer quadrant of left breast, estrogen receptor positive  Histology per Pathology Report:  11/30/2020 FINAL MICROSCOPIC DIAGNOSIS:  A. 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  - Extracapsular extension.  B. BREAST, LEFT, MASTECTOMY:  - Residual invasive ductal carcinoma, maximal extent 0.9 cm.  - Deep resection margin free of tumor.  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  - Extensive fibrosis with lymphohistiocytic infiltrate.  C. 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  D. 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  E. LYMPH NODE, LEFT AXILLARY, SENTINEL, EXCISION:  - 1 lymph node, negative for carcinoma.  F. LYMPH NODE, LEFT AXILLARY, SENTINEL, EXCISION:  - 1 lymph node, negative for carcinoma.  G. LYMPH NODE, LEFT AXILLARY, SENTINEL, EXCISION:  - Metastatic carcinoma in 1 lymph node, with prominent  histiocytic response.  H. 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  - Extracapsular extension.  I. LYMPH NODE, LEFT AXILLARY, SENTINEL, EXCISION:  - 1 lymph node, negative for carcinoma.  J. LYMPH NODE, LEFT AXILLARY, SENTINEL, EXCISION:  - 1 lymph node, negative for carcinoma.  K. LYMPH NODE, LEFT AXILLARY, SENTINEL, EXCISION:  - 1 lymph node, negative for carcinoma.  L. LYMPH NODE, LEFT AXILLARY, SENTINEL, EXCISION:  - 1 lymph node, negative for carcinoma.  M. LYMPH NODE, LEFT AXILLARY, SENTINEL, EXCISION:  - 1 lymph node, negative for carcinoma.  Receptor Status: ER(10%), PR (0%), Her2-neu (Positive), Ki-67(40%)  Did patient present with symptoms (if so, please note symptoms) or was this found on screening mammography?:  Patient had a normal mammogram September 2021. In December 2021: Following trauma to the breast  she felt a lump but for variety of reasons work-up was postponed.  Mammogram revealed 2 tumors measuring 7 cm individually 4 cm and 3 cm, 3 axillary lymph nodes,  Past/Anticipated interventions by surgeon, if any:  11/30/2020 Dr. Rolm Bookbinder Left mastectomy Left radioactive seed guided axillary lymph node excision Left deep axillary sentinel lymph node biopsy Injection of mag trace for sentinel lymph node identification  Past/Anticipated interventions by medical oncology, if any:  Under care of Dr. Nicholas Lose 12/15/2020 --Treatment plan: Maintenance therapy with Kadcyla, today is cycle 1 (until 07/13/2021) Adjuvant radiation therapy: We will be starting soon I sent a message to Dr. Melina Copa with pathology to review her final path and determine her residual cancer burden.  We will discuss this when we meet her at the next visit. --Return to clinic every 3 weeks for Kadcyla  Lymphedema issues, if any:  Patient denies, but she has noticed a small amount of fluid that has collected under her surgical incision (states it's hardly visible, and she noticed more with palpation). She is hoping to get an appointment set up with PT for evaluation and assessment soon  Pain issues, if any:  Patient denies. Reports mild lingering soreness, and a tightness in her axilla when she tries different range of motion. Both are manageable, and she's hopeful will improve after working with PT  SAFETY ISSUES: Prior radiation? No Pacemaker/ICD? No Possible current pregnancy? No--postmenopausal Is the patient on methotrexate? No  Current Complaints / other details:  Nothing else of note

## 2020-12-24 NOTE — Progress Notes (Signed)
Radiation Oncology         [phone] ________________________________  Initial Outpatient Consultation by telephone.  The patient opted for telemedicine to maximize safety during the pandemic.  MyChart video was not obtainable.   Name: Janet [name] MRN: [id number]  Date: 12/26/2020  DOB: September 22, 1965  [name], [name], NP  Nicholas Lose, MD   REFERRING PHYSICIAN: Nicholas Lose, MD  DIAGNOSIS:    ICD-10-CM   1. Malignant neoplasm of lower-outer quadrant of left breast of female, estrogen receptor positive ([place])  C50.512    Z17.0      Cancer Staging Malignant neoplasm of lower-outer quadrant of left breast of female, estrogen receptor positive ([place]) Staging form: Breast, AJCC 8th Edition - Clinical stage from 06/28/2020: Stage IIB (cT2, cN1, cM0, G3, ER+, PR-, HER2+) - Signed by Nicholas Lose, MD on 06/28/2020 Stage prefix: Initial diagnosis Histologic grading system: 3 grade system - Pathologic stage from 12/26/2020: No Stage Recommended (ypT1c, pN2a, cM0, G3, ER+, PR-, HER2+) - Signed by Eppie Gibson, MD on 12/26/2020 Stage prefix: Post-therapy Histologic grading system: 3 grade system   CHIEF COMPLAINT: Here to discuss management of left breast cancer  HISTORY OF PRESENT ILLNESS::Janet [name] is a 55 y.o. female who presents today for consideration of radiation therapy to the left breast. In November/December of 2021, the patient injured her left breast when her grandson fell on her breast.  Subsequently she felt a knot in the breast which was felt to be a bruise but it did not go away.  Because of her husband's Covid diagnosis around that time, further workup was postponed.   Mammogram (unknown date and type) revealed 2 adjacent masses in lateral left breast measuring 3.9 cm and 4 cm, an axillary tail mass measuring 2.9 cm, and at least 3 enlarged axillary nodes, with the largest measuring 2.5 cm  Left breast biopsies collected on 06/23/20 revealed the following:   --left breast mass measuring 3.9 cm at the 3 o'clock position and left breast mass measuring 4.0 cm adjacent to the 3 o'clock mass: invasive ductal carcinoma  --enlarged left breast intramammary lymph node measuring 2.9 cm at the 2 o'clock position: invasive ductal carcinoma involving nodal tissue  Bilateral breast MRI performed on 06/28/20 demonstrated two large contiguous irregular enhancing masses within the left breast, involving both the upper outer quadrant and lower outer quadrant, each measuring approximately 3.5 cm in the greatest dimension, and overall measuring greater than 7 cm in the craniocaudal dimension, both corresponding to the biopsy-proven carcinoma. A third nearly contiguous irregular enhancing mass within the upper outer quadrant of the left breast was seen as well, measuring 3 cm in the greatest dimension and noted as presumably an additional site of biopsy-proven breast malignancy versus lymph node metastasis. Lastly, at least 4 additional enlarged/morphologically abnormal lymph nodes were seen within the left axilla, noted as almost certainly lymph node metastases. (No evidence of malignancy was seen otherwise within the right breast). A liver lesion was also appreciated which was recommended follow up with focused MRI.  Subsequently, the patient was referred to Dr. Lindi Adie on 06/28/20 for urgent discussion regarding consideration of neoadjuvant chemotherapy. The patient opted to proceed with neoadjuvant chemotherapy consisting of TCH Perjeta 6 cycles followed by Herceptin Perjeta maintenance versus Kadcyla maintenance (based on response to neoadjuvant chemo) for 1 year.  CT of the chest abdomen and pelvis on 07/08/20 revealed the known left breast masses with left axillary adenopathy; no evidence of distant metastatic disease was seen.  MRI  of the liver on 07/11/20 prompted by MRI findings on 04/12 demonstrated no evidence of abdominal metastatic disease.  Following completing  6 cycles of [place], the patient opted to proceed with left breast mastectomy with nodal biopsies on 11/30/20 under the care of Dr. Donne Hazel. Pathology from the procedure revealed: Residual invasive ductal carcinoma measuring 0.9 cm in the max extent with extensive fibrosis and lymphohistiocytic infiltrate, and focal high-grade DCIS with calcifications; margin status and invasive and in-situ disease negative; nodal status of invasive disease of 4/11 left axillary sentinel nodes positive for metastatic carcinoma, with one of the positive lymph nodes with extracapsular extension present.     Pertinent imaging since then includes a bilateral breast MRI on 10/27/20 which demonstrated a marked decrease in size of the multiple enhancing masses within the upper-outer quadrant and lower outer quadrant of the left breast, as well as the normalized appearance of the multiple enlarged lymph nodes in the left axilla, consistent with a very good response to interval treatment. Some small residual enhancing foci were seen to persist at each site of biopsy-proven disease. All lymph nodes in the left axilla were also noted to appear normal in size and morphology. No evidence of malignancy within the right breast was seen as on prior imaging.   Of note: whole body bone scan performed on 07/08/20 demonstrated no scintigraphic evidence of osteoblastic metastatic disease.  She continues to work and care for her grandchild.  She reports that she is healing well from surgery.  She has not yet been scheduled for physical therapy and is curious why that this has not occurred.  She has some tightness in her axilla and some mild lingering soreness at the operative site.  SYSTEMIC THERAPY: Treated with [facility] Perjeta 07/13/20-11/16/20 under the care of Dr. Lindi Adie. Currently receiving treatment consisting of ADO-Trastuzumab Emtansine (Kadcyla) q21d started on 12/15/20.   PREVIOUS RADIATION THERAPY: No  PAST MEDICAL HISTORY:  has  a past medical history of Anemia, Fibrocystic breast disease, Heart murmur, Mitral regurgitation, Neuromuscular disorder ([place]), and Varicose vein of leg.    PAST SURGICAL HISTORY: Past Surgical History:  Procedure Laterality Date   BREAST BIOPSY     BREAST BIOPSY     BREAST LUMPECTOMY     Fibroadenoma of the left breast   DILATATION & CURETTAGE/HYSTEROSCOPY WITH MYOSURE N/A 01/26/2016   Procedure: DILATATION & [place] WITH MYOSURE;  Surgeon: Allyn Kenner, DO;  Location: [facility];  Service: Gynecology;  Laterality: N/A;   HYSTEROSCOPY WITH NOVASURE N/A 01/26/2016   Procedure: HYSTEROSCOPY WITH NOVASURE;  Surgeon: Allyn Kenner, DO;  Location: [facility];  Service: Gynecology;  Laterality: N/A;   PORTACATH PLACEMENT Right 07/12/2020   Procedure: INSERTION PORT-A-CATH RIGHT INTERNAL JUGULAR;  Surgeon: Rolm Bookbinder, MD;  Location: [facility];  Service: General;  Laterality: Right;   RADIOACTIVE SEED GUIDED AXILLARY SENTINEL LYMPH NODE Left 11/30/2020   Procedure: LEFT AXILLARY NODE SEED GUIDED EXCISION;  Surgeon: Rolm Bookbinder, MD;  Location: [facility];  Service: General;  Laterality: Left;   SIMPLE MASTECTOMY WITH AXILLARY SENTINEL NODE BIOPSY Left 11/30/2020   Procedure: LEFT SIMPLE MASTECTOMY WITH AXILLARY SENTINEL NODE BIOPSY;  Surgeon: Rolm Bookbinder, MD;  Location: [facility];  Service: General;  Laterality: Left;   [place]     with lasor R-leg   WISDOM TOOTH EXTRACTION      FAMILY HISTORY: family history includes Cancer in her father; Coronary artery disease in her paternal uncle; Diabetes in her mother; Hypertension in her mother.  SOCIAL HISTORY:  reports that she quit smoking about 18 years ago. Her smoking use included cigarettes. She has a 20.00 pack-year smoking history. She has never used smokeless tobacco. She reports that she does not drink alcohol and does not use drugs.  ALLERGIES: Patient has no known allergies.  MEDICATIONS:  Current Outpatient  Medications  Medication Sig Dispense Refill   loratadine (CLARITIN) 10 MG tablet Take 1 tablet by mouth See admin instructions. Takes on day of Kadcyla infusion     oxyCODONE (OXY IR/ROXICODONE) 5 MG immediate release tablet Take 1 tablet (5 mg total) by mouth every 4 (four) hours as needed for moderate pain. 10 tablet 0   No current facility-administered medications for this encounter.    REVIEW OF SYSTEMS: As above in HPI.   PHYSICAL EXAM:  vitals were not taken for this visit.   General: Alert and oriented, in no acute distress       LABORATORY DATA:  Lab Results  Component Value Date   WBC 7.5 12/15/2020   HGB 11.6 (L) 12/15/2020   HCT 35.3 (L) 12/15/2020   MCV 99.7 12/15/2020   PLT 262 12/15/2020   CMP     Component Value Date/Time   NA 142 12/15/2020 1030   K 4.1 12/15/2020 1030   CL 107 12/15/2020 1030   CO2 26 12/15/2020 1030   GLUCOSE 93 12/15/2020 1030   BUN 15 12/15/2020 1030   CREATININE 0.71 12/15/2020 1030   CALCIUM 9.1 12/15/2020 1030   PROT 6.7 12/15/2020 1030   ALBUMIN 3.7 12/15/2020 1030   AST 15 12/15/2020 1030   ALT 13 12/15/2020 1030   ALKPHOS 76 12/15/2020 1030   BILITOT 0.4 12/15/2020 1030   GFRNONAA >60 12/15/2020 1030        RADIOGRAPHY: As above   IMPRESSION/PLAN: This is a very delightful 55 year old woman with locally advanced left breast cancer, status postmastectomy  It was a pleasure meeting the patient today. We discussed the risks, benefits, and side effects of radiotherapy. I recommend radiotherapy to the left chest wall and regional nodes to reduce her risk of locoregional recurrence by 2/3.  We discussed that radiation would take approximately 6 weeks to complete. We spoke about acute effects including skin irritation and fatigue as well as much less common late effects including internal organ injury  (such as lungs and heart ).  We discussed the risk of lymphedema in the irradiated fields.  We spoke about the latest technology  that is used to minimize the risk of late effects for patients undergoing radiotherapy to the breast or chest wall. No guarantees of treatment were given. The patient is enthusiastic about proceeding with treatment. I look forward to participating in the patient's care.  I will ask our staff to contact her regarding an appointment for CT simulation/treatment planning.  This encounter was provided by telemedicine platform; patient desired telemedicine during pandemic precautions.  MyChart video was not available and therefore telephone was used. The patient has given verbal consent for this type of encounter and has been advised to only accept a meeting of this type in a secure network environment. On date of service, in total, I spent 45 minutes on this encounter.   The attendants for this meeting include Eppie Gibson  and Madilyn Fireman During the encounter, Eppie Gibson was located at [facility] Radiation Oncology Department.  Madilyn Fireman was located at home.     __________________________________________   Eppie Gibson, MD  This  document serves as a record of services personally performed by Eppie Gibson, MD. It was created on her behalf by Roney Mans, a trained medical scribe. The creation of this record is based on the scribe's personal observations and the provider's statements to them. This document has been checked and approved by the attending provider.

## 2020-12-26 ENCOUNTER — Encounter: Payer: Self-pay | Admitting: Radiation Oncology

## 2020-12-26 ENCOUNTER — Ambulatory Visit: Payer: 59

## 2020-12-26 ENCOUNTER — Ambulatory Visit: Payer: 59 | Admitting: Radiation Oncology

## 2020-12-26 ENCOUNTER — Ambulatory Visit
Admission: RE | Admit: 2020-12-26 | Discharge: 2020-12-26 | Disposition: A | Discharge status: Home / Self Care | Payer: 59 | Source: Ambulatory Visit | Attending: Radiation Oncology | Admitting: Radiation Oncology

## 2020-12-26 ENCOUNTER — Other Ambulatory Visit: Payer: Self-pay | Admitting: Radiation Oncology

## 2020-12-26 DIAGNOSIS — C50512 Malignant neoplasm of lower-outer quadrant of left female breast: Secondary | ICD-10-CM

## 2020-12-26 LAB — SURGICAL PATHOLOGY

## 2020-12-29 ENCOUNTER — Other Ambulatory Visit: Payer: Self-pay

## 2020-12-29 ENCOUNTER — Ambulatory Visit: Payer: 59 | Attending: General Surgery | Admitting: Rehabilitation

## 2020-12-29 ENCOUNTER — Encounter: Payer: Self-pay | Admitting: Rehabilitation

## 2020-12-29 DIAGNOSIS — Z483 Aftercare following surgery for neoplasm: Secondary | ICD-10-CM | POA: Diagnosis present

## 2020-12-29 DIAGNOSIS — Z9012 Acquired absence of left breast and nipple: Secondary | ICD-10-CM | POA: Insufficient documentation

## 2020-12-29 NOTE — Patient Instructions (Signed)
Physical Therapy Information for After Breast Cancer Surgery/Treatment:  Lymphedema is a swelling condition that you may be at risk for in your arm if you have lymph nodes removed from the armpit area.  After a sentinel node biopsy, the risk is approximately 5-9% and is higher after an axillary node dissection.  There is treatment available for this condition and it is not life-threatening.  Contact your physician or physical therapist with concerns. You may begin the 4 shoulder/posture exercises (see additional sheet) when permitted by your physician (typically a week after surgery).  If you have drains, you may need to wait until those are removed before beginning range of motion exercises.  A general recommendation is to not lift your arms above shoulder height until drains are removed.  These exercises should be done to your tolerance and gently.  This is not a "no pain/no gain" type of recovery so listen to your body and stretch into the range of motion that you can tolerate, stopping if you have pain.  If you are having immediate reconstruction, ask your plastic surgeon about doing exercises as he or she may want you to wait. We encourage you to attend the free one time ABC (After Breast Cancer) class offered by [facility].  You will learn information related to lymphedema risk, prevention and treatment and additional exercises to regain mobility following surgery.  You can call [phone] for more information.  This is offered the 1st and 3rd Monday of each month.  You only attend the class one time. While undergoing any medical procedure or treatment, try to avoid blood pressure being taken or needle sticks from occurring on the arm on the side of cancer.   This recommendation begins after surgery and continues for the rest of your life.  This may help reduce your risk of getting lymphedema (swelling in your arm). An excellent resource for those seeking information on  lymphedema is the National Lymphedema Network's web site. It can be accessed at [place].org If you notice swelling in your hand, arm or breast at any time following surgery (even if it is many years from now), please contact your doctor or physical therapist to discuss this.  Lymphedema can be treated at any time but it is easier for you if it is treated early on.  If you feel like your shoulder motion is not returning to normal in a reasonable amount of time, please contact your surgeon or physical therapist.  [facility] [place], [place], [place] [phone]; [address] [place], [place] [address] ABC CLASS After Breast Cancer Class  After Breast Cancer Class is a specially designed exercise class to assist you in a safe recover after having breast cancer surgery.  In this class you will learn how to get back to full function whether your drains were just removed or if you had surgery a month ago.   Class Goals  Understand specific stretches to improve the flexibility of you chest and shoulder. Learn ways to safely strengthen your upper body and improve your posture. Understand the warning signs of infection and why you may be at risk for an arm infection. Learn about Lymphedema and prevention.  ** You do not attend this class until after surgery.  Drains must be removed to participate  Patient was instructed today in a home exercise program today for post op shoulder range of motion. These included active assist shoulder flexion in sitting, scapular retraction, wall walking with shoulder abduction, and  hands behind head external rotation.  She was encouraged to do these twice a day, holding 3 seconds and repeating 5 times when permitted by her physician.

## 2020-12-29 NOTE — Therapy (Signed)
[facility] @ [place], [place], [address] Phone: [phone]   Fax:  [phone]  Physical Therapy Evaluation  Patient Details  Name: Janet [name] MRN: [id number] Date of Birth: [birth year] Referring Provider (PT): Dr. Isidore Moos   Encounter Date: 12/29/2020   PT End of Session - 12/29/20 1157     Visit Number 1    Number of Visits 4    Date for PT Re-Evaluation 02/23/21    Authorization Type UHC    PT Start Time 1100    PT Stop Time 1150    PT Time Calculation (min) 50 min    Activity Tolerance Patient tolerated treatment well    Behavior During Therapy [facility] for tasks assessed/performed             Past Medical History:  Diagnosis Date   Anemia    Fibrocystic breast disease    Heart murmur    Mitral regurgitation    Mild by echo 09/17/11.   Neuromuscular disorder ([place])    mild neuropathy   Varicose vein of leg     Past Surgical History:  Procedure Laterality Date   BREAST BIOPSY     BREAST BIOPSY     BREAST LUMPECTOMY     Fibroadenoma of the left breast   DILATATION & CURETTAGE/HYSTEROSCOPY WITH MYOSURE N/A 01/26/2016   Procedure: DILATATION & [place] WITH MYOSURE;  Surgeon: Allyn Kenner, DO;  Location: [facility];  Service: Gynecology;  Laterality: N/A;   HYSTEROSCOPY WITH NOVASURE N/A 01/26/2016   Procedure: HYSTEROSCOPY WITH NOVASURE;  Surgeon: Allyn Kenner, DO;  Location: [facility];  Service: Gynecology;  Laterality: N/A;   PORTACATH PLACEMENT Right 07/12/2020   Procedure: INSERTION PORT-A-CATH RIGHT INTERNAL JUGULAR;  Surgeon: Rolm Bookbinder, MD;  Location: [facility];  Service: General;  Laterality: Right;   RADIOACTIVE SEED GUIDED AXILLARY SENTINEL LYMPH NODE Left 11/30/2020   Procedure: LEFT AXILLARY NODE SEED GUIDED EXCISION;  Surgeon: Rolm Bookbinder, MD;  Location: [facility];  Service: General;  Laterality: Left;   SIMPLE MASTECTOMY WITH AXILLARY SENTINEL NODE BIOPSY Left  11/30/2020   Procedure: LEFT SIMPLE MASTECTOMY WITH AXILLARY SENTINEL NODE BIOPSY;  Surgeon: Rolm Bookbinder, MD;  Location: [facility];  Service: General;  Laterality: Left;   [place]     with lasor R-leg   WISDOM TOOTH EXTRACTION      There were no vitals filed for this visit.    Subjective Assessment - 12/29/20 1056     Subjective I maybe have some fluid on the Lt side.  Maybe some pulling in the chest with reaching overhead. Drain was out x 2.5 weeks now.  I have worn a compression bra 24/7.    Pertinent History Left breast cancer with mastectomy on 11/30/20 following neoadjuvant chemotherapy with TCH-P.  5/12 lymph nodes positive.  Now on Kadcyla and will be having radiation    Limitations Lifting;House hold activities    Patient Stated Goals anything I can do    Currently in Pain? No/denies                [facility] PT Assessment - 12/29/20 0001       Assessment   Medical Diagnosis Left breast cancer    Referring Provider (PT) Dr. Isidore Moos    Onset Date/Surgical Date 11/30/20    Hand Dominance Right    Prior Therapy no      Precautions   Precaution Comments lymphedema precaution  Restrictions   Weight Bearing Restrictions No      Balance Screen   Has the patient fallen in the past 6 months No    Has the patient had a decrease in activity level because of a fear of falling?  No    Is the patient reluctant to leave their home because of a fear of falling?  No      Home Environment   Living Environment Private residence    Living Arrangements Spouse/significant other      Prior Function   Level of Independence Independent    Vocation Full time employment    Vocation Requirements desk work    Leisure not lately, but trying to      [employer]   Overall Cognitive Status Within Functional Limits for tasks assessed      Observation/Other Assessments   Observations large seroma in chest wall sloshing full length with palpation - note sent to Dr.  Donne Hazel      ROM / Strength   AROM / PROM / Strength AROM      AROM   AROM Assessment Site Shoulder    Right/Left Shoulder Right;Left    Right Shoulder Extension 60 Degrees    Right Shoulder Flexion 160 Degrees    Right Shoulder ABduction 168 Degrees    Right Shoulder External Rotation 95 Degrees    Left Shoulder Extension 70 Degrees    Left Shoulder Flexion 160 Degrees   pull   Left Shoulder ABduction 160 Degrees   pull   Left Shoulder External Rotation 95 Degrees               LYMPHEDEMA/ONCOLOGY QUESTIONNAIRE - 12/29/20 0001       Type   Cancer Type left breast      Surgeries   Mastectomy Date 11/30/20    Axillary Lymph Node Dissection Date 11/30/20    Number Lymph Nodes Removed 12      Treatment   Active Chemotherapy Treatment Yes    Past Chemotherapy Treatment Yes    Active Radiation Treatment No    Past Radiation Treatment No    Current Hormone Treatment No    Past Hormone Therapy No      What other symptoms do you have   Are you Having Heaviness or Tightness No    Are you having Pain No      Lymphedema Assessments   Lymphedema Assessments Upper extremities      Right Upper Extremity Lymphedema   15 cm Proximal to Olecranon Process 27.5 cm    Olecranon Process 24.5 cm    10 cm Proximal to Ulnar Styloid Process 21 cm    Just Proximal to Ulnar Styloid Process 15.4 cm    Across Hand at [employer] 18.4 cm    At [place] of 2nd Digit 6.3 cm      Left Upper Extremity Lymphedema   15 cm Proximal to Olecranon Process 27.6 cm    Olecranon Process 24.5 cm    10 cm Proximal to Ulnar Styloid Process 21 cm    Just Proximal to Ulnar Styloid Process 15.4 cm    Across Hand at [employer] 19 cm    At [place] of 2nd Digit 6.2 cm                   Quick Dash - 12/29/20 0001     Open a tight or new jar No difficulty    Do heavy household chores (wash  walls, wash floors) No difficulty    Carry a shopping bag or briefcase No difficulty    Wash  your back Mild difficulty    Use a knife to cut food No difficulty    Recreational activities in which you take some force or impact through your arm, shoulder, or hand (golf, hammering, tennis) Mild difficulty    During the past week, to what extent has your arm, shoulder or hand problem interfered with your normal social activities with family, friends, neighbors, or groups? Not at all    During the past week, to what extent has your arm, shoulder or hand problem limited your work or other regular daily activities Not at all    Arm, shoulder, or hand pain. None    Tingling (pins and needles) in your arm, shoulder, or hand None    Difficulty Sleeping No difficulty    DASH Score 4.55 %              Objective measurements completed on examination: See above findings.       [facility] Adult PT Treatment/Exercise - 12/29/20 0001       Exercises   Exercises Other Exercises    Other Exercises  gave pt post op handout for continued stretches changing to supine      Manual Therapy   Manual Therapy Edema management    Manual therapy comments discussed lymphedema and risk reduction with handout    Edema Management Made pt large foam rectangle for over seroma in [place] compression bra she already has.                     PT Education - 12/29/20 1155     Education Details seroma plan, use of foam, exercises, lymphedema and risk reduction    Person(s) Educated Patient    Methods Explanation;Handout    Comprehension Verbalized understanding                 PT Long Term Goals - 12/29/20 1202       PT LONG TERM GOAL #1   Title Pt will return to baseline AROM    Time 8    Period Weeks    Status Achieved      PT LONG TERM GOAL #2   Title Pt will be educated on lymphedema and risk reduction    Time 8    Period Weeks    Status Achieved      PT LONG TERM GOAL #3   Title Pt will be ind with final HEP    Time 8    Period Weeks    Status New                     Plan - 12/29/20 1158     Clinical Impression Statement Pt presents 4 weeks post left mastectomy and ALND doing very well with AROM, incision healing, and return to function.  Pt does have a large seroma present at the chest wall following the incision so she was given a foam rectangle for extra compression and a note was sent to Dr. Donne Hazel for follow up and possible aspiration.  If he does not want to aspirate she will return for MLD.  Pt was educated on lymphedema risk which for her is higher due to radiation, chemotherapy, and ALND.  Pt was also educated on continuing stretches throughout and after radiation even with excellent ROM.    Personal Factors and Comorbidities Comorbidity 2  Comorbidities ALND, chemotherapy hx    Examination-Activity Limitations Lift    Stability/Clinical Decision Making Stable/Uncomplicated    Clinical Decision Making Low    Rehab Potential Excellent    PT Frequency 1x / week    PT Duration 8 weeks    PT Treatment/Interventions ADLs/Self Care Home Management;Therapeutic exercise;Patient/family education;Manual techniques;Taping    PT Next Visit Plan how is seroma? MLD if needed, STM pectoralis and end range PROM/AAROM activities, eventually strength ABC    PT Home Exercise Plan post op    Recommended Other Services discussed getting sleeve now vs later. Pt waiting unitl needed    Consulted and Agree with Plan of Care Patient             Patient will benefit from skilled therapeutic intervention in order to improve the following deficits and impairments:  Increased edema, Decreased skin integrity  Visit Diagnosis: S/P mastectomy, left  Aftercare following surgery for neoplasm     Problem List Patient Active Problem List   Diagnosis Date Noted   S/P mastectomy, right 11/30/2020   S/P mastectomy, left 11/30/2020   Port-A-Cath in place 07/19/2020   Malignant neoplasm of lower-outer quadrant of left breast of female, estrogen  receptor positive ([place]) 06/28/2020   Mitral regurgitation 11/14/2011    Stark Bray, PT 12/29/2020, 12:03 PM  [facility] @ [place], [place], [address] Phone: [phone]   Fax:  [phone]  Name: CHANIQUE DUCA MRN: [id number] Date of Birth: February 11, 1966

## 2021-01-03 ENCOUNTER — Encounter: Payer: Self-pay | Admitting: *Deleted

## 2021-01-04 NOTE — Progress Notes (Signed)
Patient Care Team: Charlynn Court, NP as PCP - General (Nurse Practitioner) Rockwell Germany, RN as Oncology Nurse Navigator Mauro Kaufmann, RN as Oncology Nurse Navigator  DIAGNOSIS:    ICD-10-CM   1. Malignant neoplasm of lower-outer quadrant of left breast of female, estrogen receptor positive ([place])  C50.512    Z17.0       SUMMARY OF ONCOLOGIC HISTORY: Oncology History  Malignant neoplasm of lower-outer quadrant of left breast of female, estrogen receptor positive ([place])  06/23/2020 Initial Diagnosis   Normal mammogram September 2021.  December 2021: Following trauma to the breast she felt a lump but for variety of reasons work-up was postponed.  Mammogram revealed 2 tumors measuring 7 cm individually 4 cm and 3 cm, 3 axillary lymph nodes, biopsy revealed grade 3 IDC ER 10%, PR 0%, Ki-67 40%, HER-2 +3+ by IHC, lymph node also positive   06/28/2020 Cancer Staging   Staging form: Breast, AJCC 8th Edition - Clinical stage from 06/28/2020: Stage IIB (cT2, cN1, cM0, G3, ER+, PR-, HER2+) - Signed by Nicholas Lose, MD on 06/28/2020 Stage prefix: Initial diagnosis Histologic grading system: 3 grade system   07/13/2020 - 11/16/2020 Chemotherapy   TCH Perjeta   11/30/2020 Surgery   left mastectomy: Residual IDC 0.9 cm, focal high-grade DCIS, 5/12 lymph nodes are positive including 1 micromet, margins negative, ER 10%, PR 0%, HER2 3+, Ki-67 40%   12/15/2020 -  Chemotherapy   Patient is on Treatment Plan : BREAST ADO-Trastuzumab Emtansine (Kadcyla) q21d     12/26/2020 Cancer Staging   Staging form: Breast, AJCC 8th Edition - Pathologic stage from 12/26/2020: No Stage Recommended (ypT1c, pN2a, cM0, G3, ER+, PR-, HER2+) - Signed by Eppie Gibson, MD on 12/26/2020 Stage prefix: Post-therapy Histologic grading system: 3 grade system     CHIEF COMPLIANT: Cycle 2 Kadcyla  INTERVAL HISTORY: Janet [name] is a 55 y.o. with above-mentioned history of HER-2 positive breast cancer having  completed neoadjuvant chemotherapy with [place]ieber Perjeta and left mastectomy, currently on chemotherapy with Kadcyla. She presents to the clinic today for treatment.  Intermittent diarrhea from Kadcyla but otherwise doing quite well.  ALLERGIES:  has No Known Allergies.  MEDICATIONS:  Current Outpatient Medications  Medication Sig Dispense Refill   loratadine (CLARITIN) 10 MG tablet Take 1 tablet by mouth See admin instructions. Takes on day of Kadcyla infusion     oxyCODONE (OXY IR/ROXICODONE) 5 MG immediate release tablet Take 1 tablet (5 mg total) by mouth every 4 (four) hours as needed for moderate pain. 10 tablet 0   No current facility-administered medications for this visit.    PHYSICAL EXAMINATION: ECOG PERFORMANCE STATUS: 1 - Symptomatic but completely ambulatory  Vitals:   01/05/21 1016  BP: 128/80  Pulse: 78  Resp: 17  Temp: (!) 97 F (36.1 C)  SpO2: 99%   Filed Weights   01/05/21 1016  Weight: 151 lb 8 oz (68.7 kg)    LABORATORY DATA:  I have reviewed the data as listed CMP Latest Ref Rng & Units 12/15/2020 11/30/2020 10/26/2020  Glucose 70 - 99 mg/dL 93 100(H) 91  BUN 6 - 20 mg/dL $Remove'15 9 15  'ScAJxHL$ Creatinine 0.44 - 1.00 mg/dL 0.71 0.68 0.67  Sodium 135 - 145 mmol/L 142 140 141  Potassium 3.5 - 5.1 mmol/L 4.1 3.6 3.5  Chloride 98 - 111 mmol/L 107 105 108  CO2 22 - 32 mmol/L $RemoveB'26 30 25  'mNtuIhzC$ Calcium 8.9 - 10.3 mg/dL 9.1 9.1 9.4  Total Protein  6.5 - 8.1 g/dL 6.7 - 6.6  Total Bilirubin 0.3 - 1.2 mg/dL 0.4 - 0.5  Alkaline Phos 38 - 126 U/L 76 - 85  AST 15 - 41 U/L 15 - 11(L)  ALT 0 - 44 U/L 13 - 10    Lab Results  Component Value Date   WBC 6.5 01/05/2021   HGB 11.9 (L) 01/05/2021   HCT 35.2 (L) 01/05/2021   MCV 94.4 01/05/2021   PLT 254 01/05/2021   NEUTROABS 3.2 01/05/2021    ASSESSMENT & PLAN:  Malignant neoplasm of lower-outer quadrant of left breast of female, estrogen receptor positive ([place]) 06/23/2020: Normal mammogram September 2021.  December 2021: Following trauma  to the breast she felt a lump but for variety of reasons work-up was postponed.  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   Treatment Plan: 1. Neoadjuvant chemotherapy with TCH Perjeta 6 cycles followed by Kadcyla maintenance (because of residual disease after neoadjuvant chemo) for 1 year 2. left mastectomy 11/30/2020: Residual IDC 0.9 cm, focal high-grade DCIS, 5/12 lymph nodes are positive including 1 micromet, margins negative, ER 10%, PR 0%, HER2 3+, Ki-67 40% 3. Followed by adjuvant radiation therapy 4.  Followed by antiestrogen therapy 5.  Followed by neratinib MRI liver 07/11/20: Hepatic cysts Bone scan and CT CAP: No mets Breast MRI 06/28/2020: 2 large left breast masses each 0.5 cm, overall 7 cm, third contiguous mass 3 cm (could be a lymph node, abutting pectoralis muscle), 4 lymph nodes left axilla ---------------------------------------------------------------------------------------------------------------------------- Treatment plan: Maintenance therapy with Kadcyla, today is cycle 2 (until 07/13/2021)   Adjuvant radiation therapy: We will be starting soon Residual cancer burden: Overall cancer cellularity: 10%, percentage of cancer that is in situ: 1%, residual cancer burden 3.761, RCB class III Kadcyla toxicities: Intermittent diarrhea still ongoing.  Return to clinic every 3 weeks for Kadcyla, I will see the patient every 6 weeks.    No orders of the defined types were placed in this encounter.  The patient has a good understanding of the overall plan. she agrees with it. she will call with any problems that may develop before the next visit here.  Total time spent: 30 mins including face to face time and time spent for planning, charting and coordination of care  Rulon Eisenmenger, MD, MPH 01/05/2021  I, Thana Ates, am  acting as scribe for Dr. Nicholas Lose.  I have reviewed the above documentation for accuracy and completeness, and I agree with the above.

## 2021-01-05 ENCOUNTER — Inpatient Hospital Stay (HOSPITAL_BASED_OUTPATIENT_CLINIC_OR_DEPARTMENT_OTHER): Payer: 59 | Admitting: Hematology and Oncology

## 2021-01-05 ENCOUNTER — Other Ambulatory Visit: Payer: Self-pay

## 2021-01-05 ENCOUNTER — Inpatient Hospital Stay: Payer: 59 | Attending: Hematology and Oncology

## 2021-01-05 ENCOUNTER — Inpatient Hospital Stay: Payer: 59

## 2021-01-05 DIAGNOSIS — C50512 Malignant neoplasm of lower-outer quadrant of left female breast: Secondary | ICD-10-CM | POA: Insufficient documentation

## 2021-01-05 DIAGNOSIS — Z170 Estrogen receptor positive status [ER+]: Secondary | ICD-10-CM | POA: Diagnosis not present

## 2021-01-05 DIAGNOSIS — Z79899 Other long term (current) drug therapy: Secondary | ICD-10-CM | POA: Insufficient documentation

## 2021-01-05 DIAGNOSIS — Z95828 Presence of other vascular implants and grafts: Secondary | ICD-10-CM

## 2021-01-05 DIAGNOSIS — Z5112 Encounter for antineoplastic immunotherapy: Secondary | ICD-10-CM | POA: Insufficient documentation

## 2021-01-05 LAB — CBC WITH DIFFERENTIAL (CANCER CENTER ONLY)
Abs Immature Granulocytes: 0.02 10*3/uL (ref 0.00–0.07)
Basophils Absolute: 0.1 10*3/uL (ref 0.0–0.1)
Basophils Relative: 1 %
Eosinophils Absolute: 0.6 10*3/uL — ABNORMAL HIGH (ref 0.0–0.5)
Eosinophils Relative: 10 %
HCT: 35.2 % — ABNORMAL LOW (ref 36.0–46.0)
Hemoglobin: 11.9 g/dL — ABNORMAL LOW (ref 12.0–15.0)
Immature Granulocytes: 0 %
Lymphocytes Relative: 32 %
Lymphs Abs: 2.1 10*3/uL (ref 0.7–4.0)
MCH: 31.9 pg (ref 26.0–34.0)
MCHC: 33.8 g/dL (ref 30.0–36.0)
MCV: 94.4 fL (ref 80.0–100.0)
Monocytes Absolute: 0.5 10*3/uL (ref 0.1–1.0)
Monocytes Relative: 8 %
Neutro Abs: 3.2 10*3/uL (ref 1.7–7.7)
Neutrophils Relative %: 49 %
Platelet Count: 254 10*3/uL (ref 150–400)
RBC: 3.73 MIL/uL — ABNORMAL LOW (ref 3.87–5.11)
RDW: 12.5 % (ref 11.5–15.5)
WBC Count: 6.5 10*3/uL (ref 4.0–10.5)
nRBC: 0 % (ref 0.0–0.2)

## 2021-01-05 LAB — CMP (CANCER CENTER ONLY)
ALT: 54 U/L — ABNORMAL HIGH (ref 0–44)
AST: 39 U/L (ref 15–41)
Albumin: 3.7 g/dL (ref 3.5–5.0)
Alkaline Phosphatase: 98 U/L (ref 38–126)
Anion gap: 8 (ref 5–15)
BUN: 15 mg/dL (ref 6–20)
CO2: 27 mmol/L (ref 22–32)
Calcium: 9.5 mg/dL (ref 8.9–10.3)
Chloride: 105 mmol/L (ref 98–111)
Creatinine: 0.81 mg/dL (ref 0.44–1.00)
GFR, Estimated: >60 mL/min (ref >60)
Glucose, Bld: 92 mg/dL (ref 70–99)
Potassium: 4.1 mmol/L (ref 3.5–5.1)
Sodium: 140 mmol/L (ref 135–145)
Total Bilirubin: 0.3 mg/dL (ref 0.3–1.2)
Total Protein: 7.4 g/dL (ref 6.5–8.1)

## 2021-01-05 MED ORDER — SODIUM CHLORIDE 0.9 % IV SOLN: Freq: Once | INTRAVENOUS | Status: AC

## 2021-01-05 MED ORDER — DIPHENHYDRAMINE HCL 25 MG PO CAPS
25.0000 mg | ORAL_CAPSULE | Freq: Once | ORAL | Status: AC
Start: 2021-01-05 — End: 2021-01-05
  Administered 2021-01-05: 25 mg via ORAL
  Filled 2021-01-05: qty 1

## 2021-01-05 MED ORDER — SODIUM CHLORIDE 0.9 % IV SOLN
3.6000 mg/kg | Freq: Once | INTRAVENOUS | Status: AC
  Administered 2021-01-05: 240 mg via INTRAVENOUS
  Filled 2021-01-05: qty 8

## 2021-01-05 MED ORDER — SODIUM CHLORIDE 0.9% FLUSH
10.0000 mL | INTRAVENOUS | Status: DC | PRN
  Administered 2021-01-05: 10 mL

## 2021-01-05 MED ORDER — ACETAMINOPHEN 325 MG PO TABS
650.0000 mg | ORAL_TABLET | Freq: Once | ORAL | Status: AC
Start: 2021-01-05 — End: 2021-01-05
  Administered 2021-01-05: 650 mg via ORAL
  Filled 2021-01-05: qty 2

## 2021-01-05 NOTE — Assessment & Plan Note (Signed)
06/23/2020:Normal mammogram September 2021. December 2021: Following trauma to the breast she felt a lump but for variety of reasons work-up was postponed. 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  Treatment Plan: 1. Neoadjuvant chemotherapy with TCH Perjeta 6 cycles followed by Kadcyla maintenance (because of residual disease afterneoadjuvant chemo) for 1 year 2.left mastectomy 11/30/2020: Residual IDC 0.9 cm, focal high-grade DCIS,5/12 lymph nodes are positive including 1 micromet, margins negative, ER 10%, PR 0%, HER2 3+, Ki-67 40% 3. Followed by adjuvant radiation therapy 4.Followed by antiestrogen therapy 5.Followed by neratinib MRI liver 07/11/20: Hepatic cysts Bone scan and CT CAP: No mets Breast MRI 06/28/2020: 2 large left breast masses each 0.5 cm, overall 7 cm, third contiguous mass 3 cm (could be a lymph node, abutting pectoralis muscle), 4 lymph nodes left axilla ---------------------------------------------------------------------------------------------------------------------------- Treatment plan: Maintenance therapy with Kadcyla, today is cycle 2 (until 07/13/2021)   Adjuvant radiation therapy: We will be starting soon Residual cancer burden: Overall cancer cellularity: 10%, percentage of cancer that is in situ: 1%, residual cancer burden 3.761, RCB class III Return to clinic every 3 weeks for [facility]

## 2021-01-09 ENCOUNTER — Other Ambulatory Visit: Payer: Self-pay

## 2021-01-09 ENCOUNTER — Ambulatory Visit
Admission: RE | Admit: 2021-01-09 | Discharge: 2021-01-09 | Disposition: A | Discharge status: Home / Self Care | Payer: 59 | Source: Ambulatory Visit | Attending: Radiation Oncology | Admitting: Radiation Oncology

## 2021-01-09 DIAGNOSIS — C50512 Malignant neoplasm of lower-outer quadrant of left female breast: Secondary | ICD-10-CM | POA: Diagnosis not present

## 2021-01-09 DIAGNOSIS — Z510 Encounter for antineoplastic radiation therapy: Secondary | ICD-10-CM | POA: Diagnosis present

## 2021-01-10 ENCOUNTER — Encounter: Payer: Self-pay | Admitting: *Deleted

## 2021-01-11 DIAGNOSIS — C50512 Malignant neoplasm of lower-outer quadrant of left female breast: Secondary | ICD-10-CM | POA: Diagnosis not present

## 2021-01-13 ENCOUNTER — Ambulatory Visit: Admission: RE | Admit: 2021-01-13 | Payer: 59 | Source: Ambulatory Visit | Admitting: Radiation Oncology

## 2021-01-13 ENCOUNTER — Other Ambulatory Visit: Payer: Self-pay

## 2021-01-13 DIAGNOSIS — C50512 Malignant neoplasm of lower-outer quadrant of left female breast: Secondary | ICD-10-CM | POA: Diagnosis not present

## 2021-01-16 ENCOUNTER — Ambulatory Visit
Admission: RE | Admit: 2021-01-16 | Discharge: 2021-01-16 | Disposition: A | Discharge status: Home / Self Care | Payer: 59 | Source: Ambulatory Visit | Attending: Radiation Oncology | Admitting: Radiation Oncology

## 2021-01-16 DIAGNOSIS — C50512 Malignant neoplasm of lower-outer quadrant of left female breast: Secondary | ICD-10-CM | POA: Diagnosis not present

## 2021-01-16 NOTE — Progress Notes (Signed)
Pt here for patient teaching.  Pt given Radiation and You booklet, skin care instructions, Alra deodorant, and Radiaplex gel. Reviewed areas of pertinence such as diarrhea, fatigue, hair loss, nausea and vomiting, skin changes, headache, breast tenderness, and breast swelling. Pt able to give teach back of to pat skin, use unscented/gentle soap, use baby wipes, have Imodium on hand, drink plenty of water, and sitz bath, apply Radiaplex bid, avoid applying anything to skin within 4 hours of treatment, avoid wearing an under wire bra, and to use an electric razor if they must shave. Pt verbalizes understanding of information given and will contact nursing with any questions or concerns.     Http://rtanswers.org/treatmentinformation/whattoexpect/index

## 2021-01-17 ENCOUNTER — Ambulatory Visit
Admission: RE | Admit: 2021-01-17 | Discharge: 2021-01-17 | Disposition: A | Discharge status: Home / Self Care | Payer: 59 | Source: Ambulatory Visit | Attending: Radiation Oncology | Admitting: Radiation Oncology

## 2021-01-17 ENCOUNTER — Other Ambulatory Visit: Payer: Self-pay

## 2021-01-17 DIAGNOSIS — Z170 Estrogen receptor positive status [ER+]: Secondary | ICD-10-CM | POA: Diagnosis not present

## 2021-01-17 DIAGNOSIS — Z79899 Other long term (current) drug therapy: Secondary | ICD-10-CM | POA: Diagnosis not present

## 2021-01-17 DIAGNOSIS — C50512 Malignant neoplasm of lower-outer quadrant of left female breast: Secondary | ICD-10-CM | POA: Diagnosis present

## 2021-01-17 DIAGNOSIS — C773 Secondary and unspecified malignant neoplasm of axilla and upper limb lymph nodes: Secondary | ICD-10-CM | POA: Diagnosis not present

## 2021-01-17 DIAGNOSIS — Z5112 Encounter for antineoplastic immunotherapy: Secondary | ICD-10-CM | POA: Diagnosis not present

## 2021-01-18 ENCOUNTER — Ambulatory Visit
Admission: RE | Admit: 2021-01-18 | Discharge: 2021-01-18 | Disposition: A | Discharge status: Home / Self Care | Payer: 59 | Source: Ambulatory Visit | Attending: Radiation Oncology | Admitting: Radiation Oncology

## 2021-01-18 DIAGNOSIS — Z5112 Encounter for antineoplastic immunotherapy: Secondary | ICD-10-CM | POA: Diagnosis not present

## 2021-01-19 ENCOUNTER — Ambulatory Visit
Admission: RE | Admit: 2021-01-19 | Discharge: 2021-01-19 | Disposition: A | Discharge status: Home / Self Care | Payer: 59 | Source: Ambulatory Visit | Attending: Radiation Oncology | Admitting: Radiation Oncology

## 2021-01-19 ENCOUNTER — Other Ambulatory Visit: Payer: Self-pay

## 2021-01-19 DIAGNOSIS — Z5112 Encounter for antineoplastic immunotherapy: Secondary | ICD-10-CM | POA: Diagnosis not present

## 2021-01-20 ENCOUNTER — Ambulatory Visit
Admission: RE | Admit: 2021-01-20 | Discharge: 2021-01-20 | Disposition: A | Discharge status: Home / Self Care | Payer: 59 | Source: Ambulatory Visit | Attending: Radiation Oncology | Admitting: Radiation Oncology

## 2021-01-20 ENCOUNTER — Other Ambulatory Visit: Payer: Self-pay

## 2021-01-20 DIAGNOSIS — Z5112 Encounter for antineoplastic immunotherapy: Secondary | ICD-10-CM | POA: Diagnosis not present

## 2021-01-22 NOTE — Progress Notes (Signed)
CARDIO-ONCOLOGY CLINIC NOTE  Referring Physician:[place], [facility] Primary Care: Charlynn Court Primary Cardiologist: Glori Bickers  HPI: Janet [name] is 55 y.o. female with PMH of mild MR, stage IIB left breast cancer referred by Dr. Nicholas Lose for enrollment into the Cardio-Oncology program.  Clinical stage from 06/28/2020: Left breast cancer Stage IIB (cT2, cN1, cM0, G3, ER+, PR-, HER2+).  Started on chemo DOCETAXEL + CARBOPLATIN + TRASTUZUMAB + PERTUZUMAB  (TCHP) Q21D.  Last infusion 10/05/20.  Last infusion of TCHP scheduled for August 10th. After that Herceptin Perjeta maintenance versus Kadcyla maintenance (based on response to neoadjuvant chemo) for 1 year.   S/p mastectomy on 11/30/20. Doing XRT now. 5 weeks left. Getting Kadcyla every 3 weeks. Tolerating well.   Echo 2017 EF 60-65%  Echo 06/2020 EF 60-65%, GLS -19.3% ECHO 10/13/2020 EF 60-65%, GLS -15% but poor tracking, trivial MR  Doing well. No CP or SOB. Remains active. Back at work as an Optometrist.  Echo today EF 60-65% GLS -25% Personally reviewed   Past Medical History:  Diagnosis Date   Anemia    Fibrocystic breast disease    Heart murmur    Mitral regurgitation    Mild by echo 09/17/11.   Neuromuscular disorder ([facility])    mild neuropathy   Varicose vein of leg     Current Outpatient Medications  Medication Sig Dispense Refill   Ado-Trastuzumab Emtansine (KADCYLA IV) Inject 1 Dose into the vein every 3 (three) months.     No current facility-administered medications for this encounter.    No Known Allergies    Social History   Socioeconomic History   Marital status: Married    Spouse name: Not on file   Number of children: 2   Years of education: Not on file   Highest education level: Not on file  Occupational History   Occupation: Biochemist, clinical  Tobacco Use   Smoking status: Former    Packs/day: 1.00    Years: 20.00    Pack years: 20.00    Types: Cigarettes    Quit date:  03/19/2002    Years since quitting: 18.8   Smokeless tobacco: Never  Vaping Use   Vaping Use: Never used  Substance and Sexual Activity   Alcohol use: No   Drug use: No   Sexual activity: Yes    Birth control/protection: None  Other Topics Concern   Not on file  Social History Narrative   Not on file   Social Determinants of Health   Financial Resource Strain: Not on file  Food Insecurity: Not on file  Transportation Needs: Not on file  Physical Activity: Not on file  Stress: Not on file  Social Connections: Not on file  Intimate Partner Violence: Not on file      Family History  Problem Relation Age of Onset   Cancer Father        Lung or esphageal   Hypertension Mother    Diabetes Mother    Coronary artery disease Paternal Uncle     Vitals:   01/23/21 1210  BP: 112/77  Pulse: 77  SpO2: 98%  Weight: 69 kg (152 lb 3.2 oz)    PHYSICAL EXAM: General:  Well appearing. No resp difficulty HEENT: normal Neck: supple. no JVD. R port  Carotids 2+ bilat; no bruits. No lymphadenopathy or thryomegaly appreciated. Cor: PMI nondisplaced. Regular rate & rhythm. No rubs, gallops or murmurs. Lungs: clear Abdomen: soft, nontender, nondistended. No hepatosplenomegaly. No bruits or masses. Good bowel  sounds. Extremities: no cyanosis, clubbing, rash, edema Neuro: alert & orientedx3, cranial nerves grossly intact. moves all 4 extremities w/o difficulty. Affect pleasant   ASSESSMENT & PLAN:  1. Stage IIB left  Breast Cancer -started on chemotherapy regimen outlined above on 06/2020  -ECHO 10/13/2020 EF 60-65%, GLS -15% but poor tracking, trivial MR.   - Echo today EF 60-65% GLS -25% Personally reviewed. Continue Kadcyla -no signs of chemotherapy induced toxicity, continue serial surveillance ECHO monitoring q3 months x 1 year  Glori Bickers, MD  12:29 PM

## 2021-01-23 ENCOUNTER — Ambulatory Visit (HOSPITAL_COMMUNITY)
Admission: RE | Admit: 2021-01-23 | Discharge: 2021-01-23 | Disposition: A | Discharge status: Home / Self Care | Payer: 59 | Source: Ambulatory Visit | Attending: Internal Medicine | Admitting: Internal Medicine

## 2021-01-23 ENCOUNTER — Ambulatory Visit
Admission: RE | Admit: 2021-01-23 | Discharge: 2021-01-23 | Disposition: A | Discharge status: Home / Self Care | Payer: 59 | Source: Ambulatory Visit | Attending: Radiation Oncology | Admitting: Radiation Oncology

## 2021-01-23 ENCOUNTER — Other Ambulatory Visit: Payer: Self-pay

## 2021-01-23 ENCOUNTER — Ambulatory Visit (HOSPITAL_BASED_OUTPATIENT_CLINIC_OR_DEPARTMENT_OTHER)
Admission: RE | Admit: 2021-01-23 | Discharge: 2021-01-23 | Disposition: A | Discharge status: Home / Self Care | Payer: 59 | Source: Ambulatory Visit | Attending: Internal Medicine | Admitting: Internal Medicine

## 2021-01-23 ENCOUNTER — Encounter (HOSPITAL_COMMUNITY): Payer: Self-pay | Admitting: Internal Medicine

## 2021-01-23 VITALS — BP 112/77 | HR 77 | Wt 152.2 lb

## 2021-01-23 DIAGNOSIS — C50512 Malignant neoplasm of lower-outer quadrant of left female breast: Secondary | ICD-10-CM | POA: Insufficient documentation

## 2021-01-23 DIAGNOSIS — Z87891 Personal history of nicotine dependence: Secondary | ICD-10-CM | POA: Diagnosis not present

## 2021-01-23 DIAGNOSIS — Z170 Estrogen receptor positive status [ER+]: Secondary | ICD-10-CM | POA: Diagnosis not present

## 2021-01-23 DIAGNOSIS — Z0189 Encounter for other specified special examinations: Secondary | ICD-10-CM

## 2021-01-23 DIAGNOSIS — Z5112 Encounter for antineoplastic immunotherapy: Secondary | ICD-10-CM | POA: Diagnosis not present

## 2021-01-23 DIAGNOSIS — I340 Nonrheumatic mitral (valve) insufficiency: Secondary | ICD-10-CM | POA: Insufficient documentation

## 2021-01-23 DIAGNOSIS — Z01818 Encounter for other preprocedural examination: Secondary | ICD-10-CM | POA: Diagnosis present

## 2021-01-23 LAB — ECHOCARDIOGRAM COMPLETE
Area-P 1/2: 3.72 cm2
Calc EF: 66.2 %
S' Lateral: 2.9 cm
Single Plane A2C EF: 64.4 %
Single Plane A4C EF: 69 %

## 2021-01-23 NOTE — Progress Notes (Signed)
  Echocardiogram 2D Echocardiogram has been performed.  Janet [name] 01/23/2021, 11:45 AM

## 2021-01-23 NOTE — Addendum Note (Signed)
Encounter addended by: Scarlette Calico, RN on: 01/23/2021 12:40 PM  Actions taken: Order list changed, Diagnosis association updated

## 2021-01-24 ENCOUNTER — Ambulatory Visit
Admission: RE | Admit: 2021-01-24 | Discharge: 2021-01-24 | Disposition: A | Discharge status: Home / Self Care | Payer: 59 | Source: Ambulatory Visit | Attending: Radiation Oncology | Admitting: Radiation Oncology

## 2021-01-24 DIAGNOSIS — Z5112 Encounter for antineoplastic immunotherapy: Secondary | ICD-10-CM | POA: Diagnosis not present

## 2021-01-25 ENCOUNTER — Ambulatory Visit
Admission: RE | Admit: 2021-01-25 | Discharge: 2021-01-25 | Disposition: A | Discharge status: Home / Self Care | Payer: 59 | Source: Ambulatory Visit | Attending: Radiation Oncology | Admitting: Radiation Oncology

## 2021-01-25 ENCOUNTER — Other Ambulatory Visit: Payer: Self-pay

## 2021-01-25 DIAGNOSIS — Z5112 Encounter for antineoplastic immunotherapy: Secondary | ICD-10-CM | POA: Diagnosis not present

## 2021-01-26 ENCOUNTER — Ambulatory Visit: Payer: 59 | Admitting: Hematology and Oncology

## 2021-01-26 ENCOUNTER — Inpatient Hospital Stay: Payer: 59 | Attending: Hematology and Oncology

## 2021-01-26 ENCOUNTER — Inpatient Hospital Stay: Payer: 59

## 2021-01-26 ENCOUNTER — Ambulatory Visit
Admission: RE | Admit: 2021-01-26 | Discharge: 2021-01-26 | Disposition: A | Discharge status: Home / Self Care | Payer: 59 | Source: Ambulatory Visit | Attending: Radiation Oncology | Admitting: Radiation Oncology

## 2021-01-26 VITALS — BP 119/84 | HR 74 | Temp 98.2°F | Resp 18

## 2021-01-26 DIAGNOSIS — Z5112 Encounter for antineoplastic immunotherapy: Secondary | ICD-10-CM | POA: Diagnosis not present

## 2021-01-26 DIAGNOSIS — C50512 Malignant neoplasm of lower-outer quadrant of left female breast: Secondary | ICD-10-CM

## 2021-01-26 DIAGNOSIS — Z95828 Presence of other vascular implants and grafts: Secondary | ICD-10-CM

## 2021-01-26 DIAGNOSIS — Z170 Estrogen receptor positive status [ER+]: Secondary | ICD-10-CM | POA: Insufficient documentation

## 2021-01-26 DIAGNOSIS — C773 Secondary and unspecified malignant neoplasm of axilla and upper limb lymph nodes: Secondary | ICD-10-CM | POA: Insufficient documentation

## 2021-01-26 DIAGNOSIS — Z79899 Other long term (current) drug therapy: Secondary | ICD-10-CM | POA: Insufficient documentation

## 2021-01-26 LAB — CMP (CANCER CENTER ONLY)
ALT: 37 U/L (ref 0–44)
AST: 31 U/L (ref 15–41)
Albumin: 3.9 g/dL (ref 3.5–5.0)
Alkaline Phosphatase: 92 U/L (ref 38–126)
Anion gap: 9 (ref 5–15)
BUN: 10 mg/dL (ref 6–20)
CO2: 26 mmol/L (ref 22–32)
Calcium: 9.2 mg/dL (ref 8.9–10.3)
Chloride: 105 mmol/L (ref 98–111)
Creatinine: 0.71 mg/dL (ref 0.44–1.00)
GFR, Estimated: >60 mL/min (ref >60)
Glucose, Bld: 95 mg/dL (ref 70–99)
Potassium: 3.8 mmol/L (ref 3.5–5.1)
Sodium: 140 mmol/L (ref 135–145)
Total Bilirubin: 0.4 mg/dL (ref 0.3–1.2)
Total Protein: 7.5 g/dL (ref 6.5–8.1)

## 2021-01-26 LAB — CBC WITH DIFFERENTIAL (CANCER CENTER ONLY)
Abs Immature Granulocytes: 0.01 10*3/uL (ref 0.00–0.07)
Basophils Absolute: 0 10*3/uL (ref 0.0–0.1)
Basophils Relative: 0 %
Eosinophils Absolute: 0.2 10*3/uL (ref 0.0–0.5)
Eosinophils Relative: 4 %
HCT: 37 % (ref 36.0–46.0)
Hemoglobin: 12.3 g/dL (ref 12.0–15.0)
Immature Granulocytes: 0 %
Lymphocytes Relative: 27 %
Lymphs Abs: 1.2 10*3/uL (ref 0.7–4.0)
MCH: 30.8 pg (ref 26.0–34.0)
MCHC: 33.2 g/dL (ref 30.0–36.0)
MCV: 92.7 fL (ref 80.0–100.0)
Monocytes Absolute: 0.4 10*3/uL (ref 0.1–1.0)
Monocytes Relative: 9 %
Neutro Abs: 2.7 10*3/uL (ref 1.7–7.7)
Neutrophils Relative %: 60 %
Platelet Count: 187 10*3/uL (ref 150–400)
RBC: 3.99 MIL/uL (ref 3.87–5.11)
RDW: 12.5 % (ref 11.5–15.5)
WBC Count: 4.6 10*3/uL (ref 4.0–10.5)
nRBC: 0 % (ref 0.0–0.2)

## 2021-01-26 MED ORDER — SODIUM CHLORIDE 0.9% FLUSH
10.0000 mL | INTRAVENOUS | Status: DC | PRN
  Administered 2021-01-26: 10 mL

## 2021-01-26 MED ORDER — HEPARIN SOD (PORK) LOCK FLUSH 100 UNIT/ML IV SOLN
500.0000 [IU] | Freq: Once | INTRAVENOUS | Status: AC | PRN
  Administered 2021-01-26: 500 [IU]

## 2021-01-26 MED ORDER — SODIUM CHLORIDE 0.9 % IV SOLN
3.6000 mg/kg | Freq: Once | INTRAVENOUS | Status: AC
  Administered 2021-01-26: 240 mg via INTRAVENOUS
  Filled 2021-01-26: qty 8

## 2021-01-26 MED ORDER — DIPHENHYDRAMINE HCL 25 MG PO CAPS: 25.0000 mg | ORAL_CAPSULE | Freq: Once | ORAL | Status: DC

## 2021-01-26 MED ORDER — ACETAMINOPHEN 325 MG PO TABS
650.0000 mg | ORAL_TABLET | Freq: Once | ORAL | Status: AC
  Administered 2021-01-26: 650 mg via ORAL
  Filled 2021-01-26: qty 2

## 2021-01-26 NOTE — Patient Instructions (Signed)
[facility]  Discharge Instructions: Thank you for choosing [facility] to provide your oncology and hematology care.   If you have a lab appointment with the Cancer Center, please go directly to the Cancer Center and check in at the registration area.   Wear comfortable clothing and clothing appropriate for easy access to any Portacath or PICC line.   We strive to give you quality time with your provider. You may need to reschedule your appointment if you arrive late (15 or more minutes).  Arriving late affects you and other patients whose appointments are after yours.  Also, if you miss three or more appointments without notifying the office, you may be dismissed from the clinic at the provider's discretion.      For prescription refill requests, have your pharmacy contact our office and allow 72 hours for refills to be completed.    Today you received the following chemotherapy and/or immunotherapy agents trastuzumab      To help prevent nausea and vomiting after your treatment, we encourage you to take your nausea medication as directed.  BELOW ARE SYMPTOMS THAT SHOULD BE REPORTED IMMEDIATELY: *FEVER GREATER THAN 100.4 F (38 C) OR HIGHER *CHILLS OR SWEATING *NAUSEA AND VOMITING THAT IS NOT CONTROLLED WITH YOUR NAUSEA MEDICATION *UNUSUAL SHORTNESS OF BREATH *UNUSUAL BRUISING OR BLEEDING *URINARY PROBLEMS (pain or burning when urinating, or frequent urination) *BOWEL PROBLEMS (unusual diarrhea, constipation, pain near the anus) TENDERNESS IN MOUTH AND THROAT WITH OR WITHOUT PRESENCE OF ULCERS (sore throat, sores in mouth, or a toothache) UNUSUAL RASH, SWELLING OR PAIN  UNUSUAL VAGINAL DISCHARGE OR ITCHING   Items with * indicate a potential emergency and should be followed up as soon as possible or go to the Emergency Department if any problems should occur.  Please show the CHEMOTHERAPY ALERT CARD or IMMUNOTHERAPY ALERT CARD at check-in to  the Emergency Department and triage nurse.  Should you have questions after your visit or need to cancel or reschedule your appointment, please contact [facility]  Dept: [phone]  and follow the prompts.  Office hours are 8:00 a.m. to 4:30 p.m. Monday - Friday. Please note that voicemails left after 4:00 p.m. may not be returned until the following business day.  We are closed weekends and major holidays. You have access to a nurse at all times for urgent questions. Please call the main number to the clinic Dept: [phone] and follow the prompts.   For any non-urgent questions, you may also contact your provider using MyChart. We now offer e-Visits for anyone 18 and older to request care online for non-urgent symptoms. For details visit [email or web address].   Also download the MyChart app! Go to the app store, search "MyChart", open the app, select [facility], and log in with your MyChart username and password.  Due to Covid, a mask is required upon entering the hospital/clinic. If you do not have a mask, one will be given to you upon arrival. For doctor visits, patients may have 1 support person aged 18 or older with them. For treatment visits, patients cannot have anyone with them due to current Covid guidelines and our immunocompromised population.   

## 2021-01-27 ENCOUNTER — Ambulatory Visit
Admission: RE | Admit: 2021-01-27 | Discharge: 2021-01-27 | Disposition: A | Discharge status: Home / Self Care | Payer: 59 | Source: Ambulatory Visit | Attending: Radiation Oncology | Admitting: Radiation Oncology

## 2021-01-27 ENCOUNTER — Other Ambulatory Visit: Payer: Self-pay

## 2021-01-27 DIAGNOSIS — Z5112 Encounter for antineoplastic immunotherapy: Secondary | ICD-10-CM | POA: Diagnosis not present

## 2021-01-30 ENCOUNTER — Ambulatory Visit
Admission: RE | Admit: 2021-01-30 | Discharge: 2021-01-30 | Disposition: A | Discharge status: Home / Self Care | Payer: 59 | Source: Ambulatory Visit | Attending: Radiation Oncology | Admitting: Radiation Oncology

## 2021-01-30 ENCOUNTER — Other Ambulatory Visit: Payer: Self-pay

## 2021-01-30 DIAGNOSIS — Z170 Estrogen receptor positive status [ER+]: Secondary | ICD-10-CM

## 2021-01-30 DIAGNOSIS — C50512 Malignant neoplasm of lower-outer quadrant of left female breast: Secondary | ICD-10-CM

## 2021-01-30 DIAGNOSIS — Z5112 Encounter for antineoplastic immunotherapy: Secondary | ICD-10-CM | POA: Diagnosis not present

## 2021-01-30 MED ORDER — RADIAPLEXRX EX GEL: Freq: Once | CUTANEOUS | Status: AC

## 2021-01-31 ENCOUNTER — Ambulatory Visit
Admission: RE | Admit: 2021-01-31 | Discharge: 2021-01-31 | Disposition: A | Discharge status: Home / Self Care | Payer: 59 | Source: Ambulatory Visit | Attending: Radiation Oncology | Admitting: Radiation Oncology

## 2021-01-31 DIAGNOSIS — Z5112 Encounter for antineoplastic immunotherapy: Secondary | ICD-10-CM | POA: Diagnosis not present

## 2021-02-01 ENCOUNTER — Other Ambulatory Visit: Payer: Self-pay

## 2021-02-01 ENCOUNTER — Ambulatory Visit
Admission: RE | Admit: 2021-02-01 | Discharge: 2021-02-01 | Disposition: A | Discharge status: Home / Self Care | Payer: 59 | Source: Ambulatory Visit | Attending: Radiation Oncology | Admitting: Radiation Oncology

## 2021-02-01 DIAGNOSIS — Z5112 Encounter for antineoplastic immunotherapy: Secondary | ICD-10-CM | POA: Diagnosis not present

## 2021-02-02 ENCOUNTER — Ambulatory Visit
Admission: RE | Admit: 2021-02-02 | Discharge: 2021-02-02 | Disposition: A | Discharge status: Home / Self Care | Payer: 59 | Source: Ambulatory Visit | Attending: Radiation Oncology | Admitting: Radiation Oncology

## 2021-02-02 DIAGNOSIS — Z5112 Encounter for antineoplastic immunotherapy: Secondary | ICD-10-CM | POA: Diagnosis not present

## 2021-02-03 ENCOUNTER — Ambulatory Visit: Payer: 59

## 2021-02-05 ENCOUNTER — Ambulatory Visit
Admission: RE | Admit: 2021-02-05 | Discharge: 2021-02-05 | Disposition: A | Discharge status: Home / Self Care | Payer: 59 | Source: Ambulatory Visit | Attending: Radiation Oncology | Admitting: Radiation Oncology

## 2021-02-05 DIAGNOSIS — Z5112 Encounter for antineoplastic immunotherapy: Secondary | ICD-10-CM | POA: Diagnosis not present

## 2021-02-06 ENCOUNTER — Ambulatory Visit
Admission: RE | Admit: 2021-02-06 | Discharge: 2021-02-06 | Disposition: A | Discharge status: Home / Self Care | Payer: 59 | Source: Ambulatory Visit | Attending: Radiation Oncology | Admitting: Radiation Oncology

## 2021-02-06 ENCOUNTER — Ambulatory Visit: Payer: 59

## 2021-02-06 ENCOUNTER — Other Ambulatory Visit: Payer: Self-pay

## 2021-02-06 DIAGNOSIS — Z5112 Encounter for antineoplastic immunotherapy: Secondary | ICD-10-CM | POA: Diagnosis not present

## 2021-02-07 ENCOUNTER — Ambulatory Visit: Payer: 59

## 2021-02-08 ENCOUNTER — Ambulatory Visit: Payer: 59

## 2021-02-13 ENCOUNTER — Ambulatory Visit
Admission: RE | Admit: 2021-02-13 | Discharge: 2021-02-13 | Disposition: A | Discharge status: Home / Self Care | Payer: 59 | Source: Ambulatory Visit | Attending: Radiation Oncology | Admitting: Radiation Oncology

## 2021-02-13 ENCOUNTER — Other Ambulatory Visit: Payer: Self-pay

## 2021-02-13 ENCOUNTER — Ambulatory Visit: Payer: 59

## 2021-02-13 DIAGNOSIS — Z5112 Encounter for antineoplastic immunotherapy: Secondary | ICD-10-CM | POA: Diagnosis not present

## 2021-02-14 ENCOUNTER — Ambulatory Visit
Admission: RE | Admit: 2021-02-14 | Discharge: 2021-02-14 | Disposition: A | Discharge status: Home / Self Care | Payer: 59 | Source: Ambulatory Visit | Attending: Radiation Oncology | Admitting: Radiation Oncology

## 2021-02-14 DIAGNOSIS — Z5112 Encounter for antineoplastic immunotherapy: Secondary | ICD-10-CM | POA: Diagnosis not present

## 2021-02-15 ENCOUNTER — Ambulatory Visit
Admission: RE | Admit: 2021-02-15 | Discharge: 2021-02-15 | Disposition: A | Discharge status: Home / Self Care | Payer: 59 | Source: Ambulatory Visit | Attending: Radiation Oncology | Admitting: Radiation Oncology

## 2021-02-15 ENCOUNTER — Other Ambulatory Visit: Payer: Self-pay

## 2021-02-15 DIAGNOSIS — Z5112 Encounter for antineoplastic immunotherapy: Secondary | ICD-10-CM | POA: Diagnosis not present

## 2021-02-15 NOTE — Progress Notes (Signed)
Patient Care Team: Charlynn Court, NP as PCP - General (Nurse Practitioner) Rockwell Germany, RN as Oncology Nurse Navigator Mauro Kaufmann, RN as Oncology Nurse Navigator  DIAGNOSIS:    ICD-10-CM   1. Malignant neoplasm of lower-outer quadrant of left breast of female, estrogen receptor positive ([place])  C50.512    Z17.0       SUMMARY OF ONCOLOGIC HISTORY: Oncology History  Malignant neoplasm of lower-outer quadrant of left breast of female, estrogen receptor positive ([place])  06/23/2020 Initial Diagnosis   Normal mammogram September 2021.  December 2021: Following trauma to the breast she felt a lump but for variety of reasons work-up was postponed.  Mammogram revealed 2 tumors measuring 7 cm individually 4 cm and 3 cm, 3 axillary lymph nodes, biopsy revealed grade 3 IDC ER 10%, PR 0%, Ki-67 40%, HER-2 +3+ by IHC, lymph node also positive   06/28/2020 Cancer Staging   Staging form: Breast, AJCC 8th Edition - Clinical stage from 06/28/2020: Stage IIB (cT2, cN1, cM0, G3, ER+, PR-, HER2+) - Signed by Nicholas Lose, MD on 06/28/2020 Stage prefix: Initial diagnosis Histologic grading system: 3 grade system    07/13/2020 - 11/16/2020 Chemotherapy   TCH Perjeta   11/30/2020 Surgery   left mastectomy: Residual IDC 0.9 cm, focal high-grade DCIS, 5/12 lymph nodes are positive including 1 micromet, margins negative, ER 10%, PR 0%, HER2 3+, Ki-67 40%   12/15/2020 -  Chemotherapy   Patient is on Treatment Plan : BREAST ADO-Trastuzumab Emtansine (Kadcyla) q21d     12/26/2020 Cancer Staging   Staging form: Breast, AJCC 8th Edition - Pathologic stage from 12/26/2020: No Stage Recommended (ypT1c, pN2a, cM0, G3, ER+, PR-, HER2+) - Signed by Eppie Gibson, MD on 12/26/2020 Stage prefix: Post-therapy Histologic grading system: 3 grade system      CHIEF COMPLIANT: Cycle 4 Kadcyla  INTERVAL HISTORY: Janet [name] is a 55 y.o. with above-mentioned history of HER-2 positive breast cancer having  completed neoadjuvant chemotherapy with [place] Perjeta and left mastectomy, currently on chemotherapy with Kadcyla. She presents to the clinic today for treatment.  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  She has excellent energy levels.  She is going through radiation.  ALLERGIES:  has No Known Allergies.  MEDICATIONS:  Current Outpatient Medications  Medication Sig Dispense Refill   Ado-Trastuzumab Emtansine (KADCYLA IV) Inject 1 Dose into the vein every 3 (three) months.     No current facility-administered medications for this visit.    PHYSICAL EXAMINATION: ECOG PERFORMANCE STATUS: 1 - Symptomatic but completely ambulatory  Vitals:   02/16/21 1000  BP: 110/79  Pulse: 89  Resp: 18  Temp: 97.9 F (36.6 C)  SpO2: 100%   Filed Weights   02/16/21 1000  Weight: 153 lb 9.6 oz (69.7 kg)    LABORATORY DATA:  I have reviewed the data as listed CMP Latest Ref Rng & Units 02/16/2021 01/26/2021 01/05/2021  Glucose 70 - 99 mg/dL 104(H) 95 92  BUN 6 - 20 mg/dL $Remove'12 10 15  'fdkitER$ Creatinine 0.44 - 1.00 mg/dL 0.78 0.71 0.81  Sodium 135 - 145 mmol/L 141 140 140  Potassium 3.5 - 5.1 mmol/L 4.0 3.8 4.1  Chloride 98 - 111 mmol/L 105 105 105  CO2 22 - 32 mmol/L $RemoveB'26 26 27  'HShbRrEy$ Calcium 8.9 - 10.3 mg/dL 9.2 9.2 9.5  Total Protein 6.5 - 8.1 g/dL 7.8 7.5 7.4  Total Bilirubin 0.3 - 1.2 mg/dL 0.4 0.4 0.3  Alkaline Phos 38 - 126 U/L  96 92 98  AST 15 - 41 U/L 28 31 39  ALT 0 - 44 U/L 32 37 54(H)    Lab Results  Component Value Date   WBC 5.5 02/16/2021   HGB 12.7 02/16/2021   HCT 38.7 02/16/2021   MCV 90.8 02/16/2021   PLT 180 02/16/2021   NEUTROABS 3.6 02/16/2021    ASSESSMENT & PLAN:  Malignant neoplasm of lower-outer quadrant of left breast of female, estrogen receptor positive ([place]) 06/23/2020: Normal mammogram September 2021.  December 2021: Following trauma to the breast she felt a lump but for variety of reasons work-up was postponed.  Mammogram revealed 2 tumors measuring 7 cm  individually 4 cm and 3 cm, 3 axillary lymph nodes, biopsy revealed grade 3 IDC ER 10%, PR 0%, Ki-67 40%, HER-2 +3+ by IHC, lymph node also positive.   Treatment Plan: 1. Neoadjuvant chemotherapy with TCH Perjeta 6 cycles followed by Kadcyla maintenance (because of residual disease after neoadjuvant chemo) for 1 year 2. left mastectomy 11/30/2020: Residual IDC 0.9 cm, focal high-grade DCIS, 5/12 lymph nodes are positive including 1 micromet, margins negative, ER 10%, PR 0%, HER2 3+, Ki-67 40% 3. Followed by adjuvant radiation therapy 4.  Followed by antiestrogen therapy 5.  Followed by neratinib MRI liver 07/11/20: Hepatic cysts Bone scan and CT CAP: No mets Breast MRI 06/28/2020: 2 large left breast masses each 0.5 cm, overall 7 cm, third contiguous mass 3 cm (could be a lymph node, abutting pectoralis muscle), 4 lymph nodes left axilla ---------------------------------------------------------------------------------------------------------------------------- Treatment plan: Maintenance therapy with Kadcyla, today is cycle 4 (until 07/13/2021)   Adjuvant radiation therapy: We will be starting soon Residual cancer burden: Overall cancer cellularity: 10%, percentage of cancer that is in situ: 1%, residual cancer burden 3.761, RCB class III Kadcyla toxicities: Intermittent diarrhea doing much better.   We will plan to obtain staging scans after Kadcyla is complete. Return to clinic every 3 weeks for Kadcyla, I will see the patient every 6 weeks.    No orders of the defined types were placed in this encounter.  The patient has a good understanding of the overall plan. she agrees with it. she will call with any problems that may develop before the next visit here.  Total time spent: 30 mins including face to face time and time spent for planning, charting and coordination of care  Rulon Eisenmenger, MD, MPH 02/16/2021  I, Thana Ates, am acting as scribe for Dr. Nicholas Lose.  I have  reviewed the above documentation for accuracy and completeness, and I agree with the above.

## 2021-02-16 ENCOUNTER — Inpatient Hospital Stay: Payer: 59 | Attending: Hematology and Oncology

## 2021-02-16 ENCOUNTER — Inpatient Hospital Stay: Payer: 59

## 2021-02-16 ENCOUNTER — Ambulatory Visit
Admission: RE | Admit: 2021-02-16 | Discharge: 2021-02-16 | Disposition: A | Discharge status: Home / Self Care | Payer: 59 | Source: Ambulatory Visit | Attending: Radiation Oncology | Admitting: Radiation Oncology

## 2021-02-16 ENCOUNTER — Inpatient Hospital Stay (HOSPITAL_BASED_OUTPATIENT_CLINIC_OR_DEPARTMENT_OTHER): Payer: 59 | Admitting: Hematology and Oncology

## 2021-02-16 VITALS — BP 103/66 | HR 78 | Temp 98.8°F | Resp 18

## 2021-02-16 DIAGNOSIS — Z170 Estrogen receptor positive status [ER+]: Secondary | ICD-10-CM

## 2021-02-16 DIAGNOSIS — G629 Polyneuropathy, unspecified: Secondary | ICD-10-CM | POA: Insufficient documentation

## 2021-02-16 DIAGNOSIS — Z9012 Acquired absence of left breast and nipple: Secondary | ICD-10-CM | POA: Diagnosis not present

## 2021-02-16 DIAGNOSIS — Z9221 Personal history of antineoplastic chemotherapy: Secondary | ICD-10-CM | POA: Diagnosis not present

## 2021-02-16 DIAGNOSIS — C50512 Malignant neoplasm of lower-outer quadrant of left female breast: Secondary | ICD-10-CM | POA: Insufficient documentation

## 2021-02-16 DIAGNOSIS — Z5112 Encounter for antineoplastic immunotherapy: Secondary | ICD-10-CM | POA: Diagnosis not present

## 2021-02-16 DIAGNOSIS — Z79899 Other long term (current) drug therapy: Secondary | ICD-10-CM | POA: Insufficient documentation

## 2021-02-16 DIAGNOSIS — Z95828 Presence of other vascular implants and grafts: Secondary | ICD-10-CM

## 2021-02-16 LAB — CBC WITH DIFFERENTIAL (CANCER CENTER ONLY)
Abs Immature Granulocytes: 0.01 10*3/uL (ref 0.00–0.07)
Basophils Absolute: 0.1 10*3/uL (ref 0.0–0.1)
Basophils Relative: 1 %
Eosinophils Absolute: 0.2 10*3/uL (ref 0.0–0.5)
Eosinophils Relative: 3 %
HCT: 38.7 % (ref 36.0–46.0)
Hemoglobin: 12.7 g/dL (ref 12.0–15.0)
Immature Granulocytes: 0 %
Lymphocytes Relative: 19 %
Lymphs Abs: 1.1 10*3/uL (ref 0.7–4.0)
MCH: 29.8 pg (ref 26.0–34.0)
MCHC: 32.8 g/dL (ref 30.0–36.0)
MCV: 90.8 fL (ref 80.0–100.0)
Monocytes Absolute: 0.5 10*3/uL (ref 0.1–1.0)
Monocytes Relative: 10 %
Neutro Abs: 3.6 10*3/uL (ref 1.7–7.7)
Neutrophils Relative %: 67 %
Platelet Count: 180 10*3/uL (ref 150–400)
RBC: 4.26 MIL/uL (ref 3.87–5.11)
RDW: 12.8 % (ref 11.5–15.5)
WBC Count: 5.5 10*3/uL (ref 4.0–10.5)
nRBC: 0 % (ref 0.0–0.2)

## 2021-02-16 LAB — CMP (CANCER CENTER ONLY)
ALT: 32 U/L (ref 0–44)
AST: 28 U/L (ref 15–41)
Albumin: 3.9 g/dL (ref 3.5–5.0)
Alkaline Phosphatase: 96 U/L (ref 38–126)
Anion gap: 10 (ref 5–15)
BUN: 12 mg/dL (ref 6–20)
CO2: 26 mmol/L (ref 22–32)
Calcium: 9.2 mg/dL (ref 8.9–10.3)
Chloride: 105 mmol/L (ref 98–111)
Creatinine: 0.78 mg/dL (ref 0.44–1.00)
GFR, Estimated: >60 mL/min (ref >60)
Glucose, Bld: 104 mg/dL — ABNORMAL HIGH (ref 70–99)
Potassium: 4 mmol/L (ref 3.5–5.1)
Sodium: 141 mmol/L (ref 135–145)
Total Bilirubin: 0.4 mg/dL (ref 0.3–1.2)
Total Protein: 7.8 g/dL (ref 6.5–8.1)

## 2021-02-16 MED ORDER — HEPARIN SOD (PORK) LOCK FLUSH 100 UNIT/ML IV SOLN
500.0000 [IU] | Freq: Once | INTRAVENOUS | Status: AC | PRN
  Administered 2021-02-16: 500 [IU]

## 2021-02-16 MED ORDER — SODIUM CHLORIDE 0.9% FLUSH
10.0000 mL | INTRAVENOUS | Status: DC | PRN
  Administered 2021-02-16: 10 mL

## 2021-02-16 MED ORDER — SODIUM CHLORIDE 0.9 % IV SOLN: Freq: Once | INTRAVENOUS | Status: AC

## 2021-02-16 MED ORDER — ACETAMINOPHEN 325 MG PO TABS
650.0000 mg | ORAL_TABLET | Freq: Once | ORAL | Status: AC
  Administered 2021-02-16: 650 mg via ORAL
  Filled 2021-02-16: qty 2

## 2021-02-16 MED ORDER — SODIUM CHLORIDE 0.9 % IV SOLN
3.6000 mg/kg | Freq: Once | INTRAVENOUS | Status: AC
  Administered 2021-02-16: 240 mg via INTRAVENOUS
  Filled 2021-02-16: qty 5

## 2021-02-16 NOTE — Progress Notes (Signed)
Pt declined 30 min observation, VSS at discharge

## 2021-02-16 NOTE — Assessment & Plan Note (Signed)
06/23/2020:Normal mammogram September 2021. December 2021: Following trauma to the breast she felt a lump but for variety of reasons work-up was postponed. 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  Treatment Plan: 1. Neoadjuvant chemotherapy with TCH Perjeta 6 cycles followed by Kadcyla maintenance (because of residual disease afterneoadjuvant chemo) for 1 year 2.left mastectomy 11/30/2020: Residual IDC 0.9 cm, focal high-grade DCIS,5/12 lymph nodes are positive including 1 micromet, margins negative, ER 10%, PR 0%, HER2 3+, Ki-67 40% 3. Followed by adjuvant radiation therapy 4.Followed by antiestrogen therapy 5.Followed by neratinib MRI liver 07/11/20: Hepatic cysts Bone scan and CT CAP: No mets Breast MRI 06/28/2020: 2 large left breast masses each 0.5 cm, overall 7 cm, third contiguous mass 3 cm (could be a lymph node, abutting pectoralis muscle), 4 lymph nodes left axilla ---------------------------------------------------------------------------------------------------------------------------- Treatment plan: Maintenance therapy with Kadcyla, today is cycle 4(until 07/13/2021)  Adjuvant radiation therapy: We will be starting soon Residual cancer burden: Overall cancer cellularity: 10%, percentage of cancer that is in situ: 1%, residual cancer burden 3.761, RCB class III Kadcyla toxicities: Intermittent diarrhea still ongoing.  Return to clinic every 3 weeks for Kadcyla, I will see the patient every 6 weeks.

## 2021-02-16 NOTE — Patient Instructions (Signed)
[facility]  Discharge Instructions: °Thank you for choosing [facility] to provide your oncology and hematology care.  ° °If you have a lab appointment with the Cancer Center, please go directly to the Cancer Center and check in at the registration area. °  °Wear comfortable clothing and clothing appropriate for easy access to any Portacath or PICC line.  ° °We strive to give you quality time with your provider. You may need to reschedule your appointment if you arrive late (15 or more minutes).  Arriving late affects you and other patients whose appointments are after yours.  Also, if you miss three or more appointments without notifying the office, you may be dismissed from the clinic at the provider’s discretion.    °  °For prescription refill requests, have your pharmacy contact our office and allow 72 hours for refills to be completed.   ° °Today you received the following chemotherapy and/or immunotherapy agents Kadcyla    °  °To help prevent nausea and vomiting after your treatment, we encourage you to take your nausea medication as directed. ° °BELOW ARE SYMPTOMS THAT SHOULD BE REPORTED IMMEDIATELY: °*FEVER GREATER THAN 100.4 F (38 °C) OR HIGHER °*CHILLS OR SWEATING °*NAUSEA AND VOMITING THAT IS NOT CONTROLLED WITH YOUR NAUSEA MEDICATION °*UNUSUAL SHORTNESS OF BREATH °*UNUSUAL BRUISING OR BLEEDING °*URINARY PROBLEMS (pain or burning when urinating, or frequent urination) °*BOWEL PROBLEMS (unusual diarrhea, constipation, pain near the anus) °TENDERNESS IN MOUTH AND THROAT WITH OR WITHOUT PRESENCE OF ULCERS (sore throat, sores in mouth, or a toothache) °UNUSUAL RASH, SWELLING OR PAIN  °UNUSUAL VAGINAL DISCHARGE OR ITCHING  ° °Items with * indicate a potential emergency and should be followed up as soon as possible or go to the Emergency Department if any problems should occur. ° °Please show the CHEMOTHERAPY ALERT CARD or IMMUNOTHERAPY ALERT CARD at check-in to the  Emergency Department and triage nurse. ° °Should you have questions after your visit or need to cancel or reschedule your appointment, please contact [facility]  Dept: [phone]  and follow the prompts.  Office hours are 8:00 a.m. to 4:30 p.m. Monday - Friday. Please note that voicemails left after 4:00 p.m. may not be returned until the following business day.  We are closed weekends and major holidays. You have access to a nurse at all times for urgent questions. Please call the main number to the clinic Dept: [phone] and follow the prompts. ° ° °For any non-urgent questions, you may also contact your provider using MyChart. We now offer e-Visits for anyone 55 and older to request care online for non-urgent symptoms. For details visit [email or web address]. °  °Also download the MyChart app! Go to the app store, search "MyChart", open the app, select [facility], and log in with your MyChart username and password. ° °Due to Covid, a mask is required upon entering the hospital/clinic. If you do not have a mask, one will be given to you upon arrival. For doctor visits, patients may have 1 support person aged 18 or older with them. For treatment visits, patients cannot have anyone with them due to current Covid guidelines and our immunocompromised population.  ° °

## 2021-02-17 ENCOUNTER — Ambulatory Visit
Admission: RE | Admit: 2021-02-17 | Discharge: 2021-02-17 | Disposition: A | Discharge status: Home / Self Care | Payer: 59 | Source: Ambulatory Visit | Attending: Radiation Oncology | Admitting: Radiation Oncology

## 2021-02-17 ENCOUNTER — Other Ambulatory Visit: Payer: Self-pay

## 2021-02-17 DIAGNOSIS — C50512 Malignant neoplasm of lower-outer quadrant of left female breast: Secondary | ICD-10-CM | POA: Diagnosis not present

## 2021-02-20 ENCOUNTER — Ambulatory Visit: Payer: 59 | Admitting: Radiation Oncology

## 2021-02-20 ENCOUNTER — Ambulatory Visit
Admission: RE | Admit: 2021-02-20 | Discharge: 2021-02-20 | Disposition: A | Discharge status: Home / Self Care | Payer: 59 | Source: Ambulatory Visit | Attending: Radiation Oncology | Admitting: Radiation Oncology

## 2021-02-20 ENCOUNTER — Other Ambulatory Visit: Payer: Self-pay

## 2021-02-20 DIAGNOSIS — C50512 Malignant neoplasm of lower-outer quadrant of left female breast: Secondary | ICD-10-CM | POA: Diagnosis not present

## 2021-02-20 DIAGNOSIS — Z170 Estrogen receptor positive status [ER+]: Secondary | ICD-10-CM

## 2021-02-20 MED ORDER — RADIAPLEXRX EX GEL: Freq: Once | CUTANEOUS | Status: AC

## 2021-02-21 ENCOUNTER — Ambulatory Visit: Payer: 59

## 2021-02-21 DIAGNOSIS — C50512 Malignant neoplasm of lower-outer quadrant of left female breast: Secondary | ICD-10-CM | POA: Diagnosis not present

## 2021-02-22 ENCOUNTER — Ambulatory Visit: Payer: 59

## 2021-02-22 ENCOUNTER — Other Ambulatory Visit: Payer: Self-pay

## 2021-02-22 DIAGNOSIS — C50512 Malignant neoplasm of lower-outer quadrant of left female breast: Secondary | ICD-10-CM | POA: Diagnosis not present

## 2021-02-23 ENCOUNTER — Ambulatory Visit
Admission: RE | Admit: 2021-02-23 | Discharge: 2021-02-23 | Disposition: A | Discharge status: Home / Self Care | Payer: 59 | Source: Ambulatory Visit | Attending: Radiation Oncology | Admitting: Radiation Oncology

## 2021-02-23 DIAGNOSIS — C50512 Malignant neoplasm of lower-outer quadrant of left female breast: Secondary | ICD-10-CM | POA: Diagnosis not present

## 2021-02-24 ENCOUNTER — Other Ambulatory Visit: Payer: Self-pay

## 2021-02-24 ENCOUNTER — Ambulatory Visit
Admission: RE | Admit: 2021-02-24 | Discharge: 2021-02-24 | Disposition: A | Discharge status: Home / Self Care | Payer: 59 | Source: Ambulatory Visit | Attending: Radiation Oncology | Admitting: Radiation Oncology

## 2021-02-24 DIAGNOSIS — C50512 Malignant neoplasm of lower-outer quadrant of left female breast: Secondary | ICD-10-CM | POA: Diagnosis not present

## 2021-02-27 ENCOUNTER — Ambulatory Visit: Payer: 59

## 2021-02-27 ENCOUNTER — Ambulatory Visit
Admission: RE | Admit: 2021-02-27 | Discharge: 2021-02-27 | Disposition: A | Discharge status: Home / Self Care | Payer: 59 | Source: Ambulatory Visit | Attending: Radiation Oncology | Admitting: Radiation Oncology

## 2021-02-27 DIAGNOSIS — C50512 Malignant neoplasm of lower-outer quadrant of left female breast: Secondary | ICD-10-CM

## 2021-02-27 MED ORDER — RADIAPLEXRX EX GEL: Freq: Once | CUTANEOUS | Status: AC

## 2021-02-28 ENCOUNTER — Ambulatory Visit: Payer: 59

## 2021-02-28 ENCOUNTER — Encounter: Payer: Self-pay | Admitting: *Deleted

## 2021-02-28 ENCOUNTER — Ambulatory Visit
Admission: RE | Admit: 2021-02-28 | Discharge: 2021-02-28 | Disposition: A | Discharge status: Home / Self Care | Payer: 59 | Source: Ambulatory Visit | Attending: Radiation Oncology | Admitting: Radiation Oncology

## 2021-02-28 DIAGNOSIS — C50512 Malignant neoplasm of lower-outer quadrant of left female breast: Secondary | ICD-10-CM | POA: Diagnosis not present

## 2021-03-01 ENCOUNTER — Other Ambulatory Visit: Payer: Self-pay

## 2021-03-01 ENCOUNTER — Ambulatory Visit
Admission: RE | Admit: 2021-03-01 | Discharge: 2021-03-01 | Disposition: A | Discharge status: Home / Self Care | Payer: 59 | Source: Ambulatory Visit | Attending: Radiation Oncology | Admitting: Radiation Oncology

## 2021-03-01 DIAGNOSIS — C50512 Malignant neoplasm of lower-outer quadrant of left female breast: Secondary | ICD-10-CM | POA: Diagnosis not present

## 2021-03-02 ENCOUNTER — Encounter: Payer: Self-pay | Admitting: Radiation Oncology

## 2021-03-02 ENCOUNTER — Ambulatory Visit
Admission: RE | Admit: 2021-03-02 | Discharge: 2021-03-02 | Disposition: A | Discharge status: Home / Self Care | Payer: 59 | Source: Ambulatory Visit | Attending: Radiation Oncology | Admitting: Radiation Oncology

## 2021-03-02 DIAGNOSIS — C50512 Malignant neoplasm of lower-outer quadrant of left female breast: Secondary | ICD-10-CM | POA: Diagnosis not present

## 2021-03-09 ENCOUNTER — Inpatient Hospital Stay: Payer: 59

## 2021-03-09 ENCOUNTER — Other Ambulatory Visit: Payer: Self-pay

## 2021-03-09 VITALS — BP 113/75 | HR 80 | Temp 97.7°F | Resp 18 | Wt 155.2 lb

## 2021-03-09 DIAGNOSIS — Z95828 Presence of other vascular implants and grafts: Secondary | ICD-10-CM

## 2021-03-09 DIAGNOSIS — C50512 Malignant neoplasm of lower-outer quadrant of left female breast: Secondary | ICD-10-CM

## 2021-03-09 DIAGNOSIS — Z170 Estrogen receptor positive status [ER+]: Secondary | ICD-10-CM

## 2021-03-09 LAB — CBC WITH DIFFERENTIAL (CANCER CENTER ONLY)
Abs Immature Granulocytes: 0.01 10*3/uL (ref 0.00–0.07)
Basophils Absolute: 0 10*3/uL (ref 0.0–0.1)
Basophils Relative: 1 %
Eosinophils Absolute: 0.2 10*3/uL (ref 0.0–0.5)
Eosinophils Relative: 5 %
HCT: 37 % (ref 36.0–46.0)
Hemoglobin: 12.3 g/dL (ref 12.0–15.0)
Immature Granulocytes: 0 %
Lymphocytes Relative: 20 %
Lymphs Abs: 0.9 10*3/uL (ref 0.7–4.0)
MCH: 29.6 pg (ref 26.0–34.0)
MCHC: 33.2 g/dL (ref 30.0–36.0)
MCV: 89.2 fL (ref 80.0–100.0)
Monocytes Absolute: 0.6 10*3/uL (ref 0.1–1.0)
Monocytes Relative: 15 %
Neutro Abs: 2.6 10*3/uL (ref 1.7–7.7)
Neutrophils Relative %: 59 %
Platelet Count: 179 10*3/uL (ref 150–400)
RBC: 4.15 MIL/uL (ref 3.87–5.11)
RDW: 13.4 % (ref 11.5–15.5)
WBC Count: 4.4 10*3/uL (ref 4.0–10.5)
nRBC: 0 % (ref 0.0–0.2)

## 2021-03-09 LAB — CMP (CANCER CENTER ONLY)
ALT: 24 U/L (ref 0–44)
AST: 24 U/L (ref 15–41)
Albumin: 3.9 g/dL (ref 3.5–5.0)
Alkaline Phosphatase: 81 U/L (ref 38–126)
Anion gap: 6 (ref 5–15)
BUN: 13 mg/dL (ref 6–20)
CO2: 29 mmol/L (ref 22–32)
Calcium: 9.3 mg/dL (ref 8.9–10.3)
Chloride: 105 mmol/L (ref 98–111)
Creatinine: 0.7 mg/dL (ref 0.44–1.00)
GFR, Estimated: >60 mL/min (ref >60)
Glucose, Bld: 88 mg/dL (ref 70–99)
Potassium: 3.8 mmol/L (ref 3.5–5.1)
Sodium: 140 mmol/L (ref 135–145)
Total Bilirubin: 0.5 mg/dL (ref 0.3–1.2)
Total Protein: 7.5 g/dL (ref 6.5–8.1)

## 2021-03-09 MED ORDER — SODIUM CHLORIDE 0.9 % IV SOLN: Freq: Once | INTRAVENOUS | Status: AC

## 2021-03-09 MED ORDER — SODIUM CHLORIDE 0.9% FLUSH
10.0000 mL | INTRAVENOUS | Status: DC | PRN
  Administered 2021-03-09: 09:00:00 10 mL

## 2021-03-09 MED ORDER — ACETAMINOPHEN 325 MG PO TABS
650.0000 mg | ORAL_TABLET | Freq: Once | ORAL | Status: AC
  Administered 2021-03-09: 10:00:00 650 mg via ORAL
  Filled 2021-03-09: qty 2

## 2021-03-09 MED ORDER — SODIUM CHLORIDE 0.9% FLUSH
10.0000 mL | INTRAVENOUS | Status: DC | PRN
  Administered 2021-03-09: 11:00:00 10 mL

## 2021-03-09 MED ORDER — HEPARIN SOD (PORK) LOCK FLUSH 100 UNIT/ML IV SOLN
500.0000 [IU] | Freq: Once | INTRAVENOUS | Status: AC | PRN
  Administered 2021-03-09: 11:00:00 500 [IU]

## 2021-03-09 MED ORDER — SODIUM CHLORIDE 0.9 % IV SOLN
3.6000 mg/kg | Freq: Once | INTRAVENOUS | Status: AC
  Administered 2021-03-09: 11:00:00 240 mg via INTRAVENOUS
  Filled 2021-03-09: qty 8

## 2021-03-09 NOTE — Progress Notes (Signed)
Pt declined to stay for 30 min observation following infusion. VSS and pt discharged

## 2021-03-09 NOTE — Patient Instructions (Signed)
[facility]  Discharge Instructions: °Thank you for choosing [facility] to provide your oncology and hematology care.  ° °If you have a lab appointment with the Cancer Center, please go directly to the Cancer Center and check in at the registration area. °  °Wear comfortable clothing and clothing appropriate for easy access to any Portacath or PICC line.  ° °We strive to give you quality time with your provider. You may need to reschedule your appointment if you arrive late (15 or more minutes).  Arriving late affects you and other patients whose appointments are after yours.  Also, if you miss three or more appointments without notifying the office, you may be dismissed from the clinic at the provider’s discretion.    °  °For prescription refill requests, have your pharmacy contact our office and allow 72 hours for refills to be completed.   ° °Today you received the following chemotherapy and/or immunotherapy agents Kadcyla    °  °To help prevent nausea and vomiting after your treatment, we encourage you to take your nausea medication as directed. ° °BELOW ARE SYMPTOMS THAT SHOULD BE REPORTED IMMEDIATELY: °*FEVER GREATER THAN 100.4 F (38 °C) OR HIGHER °*CHILLS OR SWEATING °*NAUSEA AND VOMITING THAT IS NOT CONTROLLED WITH YOUR NAUSEA MEDICATION °*UNUSUAL SHORTNESS OF BREATH °*UNUSUAL BRUISING OR BLEEDING °*URINARY PROBLEMS (pain or burning when urinating, or frequent urination) °*BOWEL PROBLEMS (unusual diarrhea, constipation, pain near the anus) °TENDERNESS IN MOUTH AND THROAT WITH OR WITHOUT PRESENCE OF ULCERS (sore throat, sores in mouth, or a toothache) °UNUSUAL RASH, SWELLING OR PAIN  °UNUSUAL VAGINAL DISCHARGE OR ITCHING  ° °Items with * indicate a potential emergency and should be followed up as soon as possible or go to the Emergency Department if any problems should occur. ° °Please show the CHEMOTHERAPY ALERT CARD or IMMUNOTHERAPY ALERT CARD at check-in to the  Emergency Department and triage nurse. ° °Should you have questions after your visit or need to cancel or reschedule your appointment, please contact [facility]  Dept: [phone]  and follow the prompts.  Office hours are 8:00 a.m. to 4:30 p.m. Monday - Friday. Please note that voicemails left after 4:00 p.m. may not be returned until the following business day.  We are closed weekends and major holidays. You have access to a nurse at all times for urgent questions. Please call the main number to the clinic Dept: [phone] and follow the prompts. ° ° °For any non-urgent questions, you may also contact your provider using MyChart. We now offer e-Visits for anyone 18 and older to request care online for non-urgent symptoms. For details visit [email or web address]. °  °Also download the MyChart app! Go to the app store, search "MyChart", open the app, select [facility], and log in with your MyChart username and password. ° °Due to Covid, a mask is required upon entering the hospital/clinic. If you do not have a mask, one will be given to you upon arrival. For doctor visits, patients may have 1 support person aged 18 or older with them. For treatment visits, patients cannot have anyone with them due to current Covid guidelines and our immunocompromised population.  ° °

## 2021-03-24 ENCOUNTER — Encounter: Payer: Self-pay | Admitting: Hematology and Oncology

## 2021-03-24 NOTE — Progress Notes (Signed)
° °                                                                                                                                                          °  Patient Name: Janet [name] MRN: [id number] DOB: 1965-10-06 Referring Physician: Nicholas Lose (Profile Not Attached) Date of Service: 03/02/2021 [facility] [place]                                                        End Of Treatment Note  Diagnoses: C50.512-Malignant neoplasm of lower-outer quadrant of left female breast  Cancer Staging:  Cancer Staging  Malignant neoplasm of lower-outer quadrant of left breast of female, estrogen receptor positive ([place]) Staging form: Breast, AJCC 8th Edition - Clinical stage from 06/28/2020: Stage IIB (cT2, cN1, cM0, G3, ER+, PR-, HER2+) - Signed by Nicholas Lose, MD on 06/28/2020 Stage prefix: Initial diagnosis Histologic grading system: 3 grade system - Pathologic stage from 12/26/2020: No Stage Recommended (ypT1c, pN2a, cM0, G3, ER+, PR-, HER2+) - Signed by Eppie Gibson, MD on 12/26/2020 Stage prefix: Post-therapy Histologic grading system: 3 grade system   Intent: Curative  Radiation Treatment Dates: 01/16/2021 through 03/02/2021 Site Technique Total Dose (Gy) Dose per Fx (Gy) Completed Fx Beam Energies  Chest Wall, Left: CW_Lt_IMN 3D 50/50 2 25/25 6X, 10X  Chest Wall, Left: CW_Lt_SCV_PAB 3D 50/50 2 25/25 6X, 10X  Chest Wall, Left: CW_Lt_Bst Electron 10/10 2 5/5 6E   Narrative: The patient tolerated radiation therapy relatively well.   Plan: The patient will follow-up with radiation oncology in 82mo . -----------------------------------  Eppie Gibson, MD

## 2021-03-29 NOTE — Assessment & Plan Note (Signed)
/  09/2020:Normal mammogram September 2021. December 2021: Following trauma to the breast she felt a lump but for variety of reasons work-up was postponed. 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  Treatment Plan: 1. Neoadjuvant chemotherapy with TCH Perjeta 6 cycles followed by Kadcyla maintenance (because of residual disease afterneoadjuvant chemo) for 1 year 2.left mastectomy 11/30/2020: Residual IDC 0.9 cm, focal high-grade DCIS,5/12 lymph nodes are positive including 1 micromet, margins negative, ER 10%, PR 0%, HER2 3+, Ki-67 40% 3. Followed by adjuvant radiation therapy 4.Followed by antiestrogen therapy 5.Followed by neratinib MRI liver 07/11/20: Hepatic cysts Bone scan and CT CAP: No mets Breast MRI 06/28/2020: 2 large left breast masses each 0.5 cm, overall 7 cm, third contiguous mass 3 cm (could be a lymph node, abutting pectoralis muscle), 4 lymph nodes left axilla ---------------------------------------------------------------------------------------------------------------------------- Treatment plan: Maintenance therapy with Kadcyla, today is cycle5(until 07/13/2021)  Adjuvant radiation therapy: We will be starting soon Residual cancer burden: Overall cancer cellularity: 10%, percentage of cancer that is in situ: 1%, residual cancer burden 3.761, RCB class III Kadcyla toxicities:Intermittent diarrhea doing much better.  We will plan to obtain staging scans after Kadcyla is complete. Return to clinic every 3 weeks for Kadcyla, I will see the patient every 6 weeks.

## 2021-03-30 ENCOUNTER — Inpatient Hospital Stay: Payer: 59

## 2021-03-30 ENCOUNTER — Inpatient Hospital Stay: Payer: 59 | Attending: Hematology and Oncology

## 2021-03-30 ENCOUNTER — Inpatient Hospital Stay (HOSPITAL_BASED_OUTPATIENT_CLINIC_OR_DEPARTMENT_OTHER): Payer: 59 | Admitting: Hematology and Oncology

## 2021-03-30 ENCOUNTER — Other Ambulatory Visit: Payer: Self-pay

## 2021-03-30 DIAGNOSIS — Z79899 Other long term (current) drug therapy: Secondary | ICD-10-CM | POA: Insufficient documentation

## 2021-03-30 DIAGNOSIS — C50512 Malignant neoplasm of lower-outer quadrant of left female breast: Secondary | ICD-10-CM | POA: Insufficient documentation

## 2021-03-30 DIAGNOSIS — Z95828 Presence of other vascular implants and grafts: Secondary | ICD-10-CM

## 2021-03-30 DIAGNOSIS — Z170 Estrogen receptor positive status [ER+]: Secondary | ICD-10-CM

## 2021-03-30 DIAGNOSIS — Z5112 Encounter for antineoplastic immunotherapy: Secondary | ICD-10-CM | POA: Insufficient documentation

## 2021-03-30 LAB — CBC WITH DIFFERENTIAL (CANCER CENTER ONLY)
Abs Immature Granulocytes: 0.03 10*3/uL (ref 0.00–0.07)
Basophils Absolute: 0.1 10*3/uL (ref 0.0–0.1)
Basophils Relative: 1 %
Eosinophils Absolute: 0.2 10*3/uL (ref 0.0–0.5)
Eosinophils Relative: 4 %
HCT: 35.4 % — ABNORMAL LOW (ref 36.0–46.0)
Hemoglobin: 11.7 g/dL — ABNORMAL LOW (ref 12.0–15.0)
Immature Granulocytes: 1 %
Lymphocytes Relative: 18 %
Lymphs Abs: 1 10*3/uL (ref 0.7–4.0)
MCH: 29.2 pg (ref 26.0–34.0)
MCHC: 33.1 g/dL (ref 30.0–36.0)
MCV: 88.3 fL (ref 80.0–100.0)
Monocytes Absolute: 0.9 10*3/uL (ref 0.1–1.0)
Monocytes Relative: 16 %
Neutro Abs: 3.4 10*3/uL (ref 1.7–7.7)
Neutrophils Relative %: 60 %
Platelet Count: 208 10*3/uL (ref 150–400)
RBC: 4.01 MIL/uL (ref 3.87–5.11)
RDW: 13.9 % (ref 11.5–15.5)
WBC Count: 5.5 10*3/uL (ref 4.0–10.5)
nRBC: 0 % (ref 0.0–0.2)

## 2021-03-30 LAB — CMP (CANCER CENTER ONLY)
ALT: 16 U/L (ref 0–44)
AST: 23 U/L (ref 15–41)
Albumin: 3.8 g/dL (ref 3.5–5.0)
Alkaline Phosphatase: 97 U/L (ref 38–126)
Anion gap: 7 (ref 5–15)
BUN: 10 mg/dL (ref 6–20)
CO2: 29 mmol/L (ref 22–32)
Calcium: 9.2 mg/dL (ref 8.9–10.3)
Chloride: 101 mmol/L (ref 98–111)
Creatinine: 0.67 mg/dL (ref 0.44–1.00)
GFR, Estimated: >60 mL/min (ref >60)
Glucose, Bld: 89 mg/dL (ref 70–99)
Potassium: 3.6 mmol/L (ref 3.5–5.1)
Sodium: 137 mmol/L (ref 135–145)
Total Bilirubin: 0.5 mg/dL (ref 0.3–1.2)
Total Protein: 7.8 g/dL (ref 6.5–8.1)

## 2021-03-30 MED ORDER — ACETAMINOPHEN 325 MG PO TABS
650.0000 mg | ORAL_TABLET | Freq: Once | ORAL | Status: AC
  Administered 2021-03-30: 650 mg via ORAL
  Filled 2021-03-30: qty 2

## 2021-03-30 MED ORDER — LETROZOLE 2.5 MG PO TABS: 2.5000 mg | ORAL_TABLET | Freq: Every day | ORAL | 3 refills | Status: DC

## 2021-03-30 MED ORDER — SODIUM CHLORIDE 0.9 % IV SOLN: Freq: Once | INTRAVENOUS | Status: AC

## 2021-03-30 MED ORDER — SODIUM CHLORIDE 0.9% FLUSH
10.0000 mL | INTRAVENOUS | Status: DC | PRN
  Administered 2021-03-30: 10 mL

## 2021-03-30 MED ORDER — SODIUM CHLORIDE 0.9 % IV SOLN
3.6000 mg/kg | Freq: Once | INTRAVENOUS | Status: AC
  Administered 2021-03-30: 240 mg via INTRAVENOUS
  Filled 2021-03-30: qty 8

## 2021-03-30 NOTE — Progress Notes (Signed)
Patient declined to stay for 30 minute post observation period. No complaints, ambulatory to lobby.

## 2021-03-30 NOTE — Progress Notes (Signed)
Patient Care Team: Charlynn Court, NP as PCP - General (Nurse Practitioner) Rockwell Germany, RN as Oncology Nurse Navigator Mauro Kaufmann, RN as Oncology Nurse Navigator  DIAGNOSIS:  Encounter Diagnosis  Name Primary?   Malignant neoplasm of lower-outer quadrant of left breast of female, estrogen receptor positive ([place])     SUMMARY OF ONCOLOGIC HISTORY: Oncology History  Malignant neoplasm of lower-outer quadrant of left breast of female, estrogen receptor positive ([place])  06/23/2020 Initial Diagnosis   Normal mammogram September 2021.  December 2021: Following trauma to the breast she felt a lump but for variety of reasons work-up was postponed.  Mammogram revealed 2 tumors measuring 7 cm individually 4 cm and 3 cm, 3 axillary lymph nodes, biopsy revealed grade 3 IDC ER 10%, PR 0%, Ki-67 40%, HER-2 +3+ by IHC, lymph node also positive   06/28/2020 Cancer Staging   Staging form: Breast, AJCC 8th Edition - Clinical stage from 06/28/2020: Stage IIB (cT2, cN1, cM0, G3, ER+, PR-, HER2+) - Signed by Nicholas Lose, MD on 06/28/2020 Stage prefix: Initial diagnosis Histologic grading system: 3 grade system    07/13/2020 - 11/16/2020 Chemotherapy   TCH Perjeta   11/30/2020 Surgery   left mastectomy: Residual IDC 0.9 cm, focal high-grade DCIS, 5/12 lymph nodes are positive including 1 micromet, margins negative, ER 10%, PR 0%, HER2 3+, Ki-67 40%   12/15/2020 -  Chemotherapy   Patient is on Treatment Plan : BREAST ADO-Trastuzumab Emtansine (Kadcyla) q21d     12/26/2020 Cancer Staging   Staging form: Breast, AJCC 8th Edition - Pathologic stage from 12/26/2020: No Stage Recommended (ypT1c, pN2a, cM0, G3, ER+, PR-, HER2+) - Signed by Eppie Gibson, MD on 12/26/2020 Stage prefix: Post-therapy Histologic grading system: 3 grade system      CHIEF COMPLIANT: Follow-up on Kadcyla  INTERVAL HISTORY: [name] is a 56 year old above-mentioned history of HER2 positive breast cancer who is  currently on adjuvant treatment with cancer and appears to be tolerating it fairly well.  She has intermittent diarrhea which is not significant.  She is able to manage that.  Denies any nausea or vomiting.   ALLERGIES:  has No Known Allergies.  MEDICATIONS:  Current Outpatient Medications  Medication Sig Dispense Refill   Ado-Trastuzumab Emtansine (KADCYLA IV) Inject 1 Dose into the vein every 3 (three) months.     No current facility-administered medications for this visit.    PHYSICAL EXAMINATION: ECOG PERFORMANCE STATUS: 1 - Symptomatic but completely ambulatory  Vitals:   03/30/21 0845  BP: 118/75  Pulse: 88  Resp: 17  Temp: (!) 97.2 F (36.2 C)  SpO2: 98%   Filed Weights   03/30/21 0845  Weight: 156 lb 9.6 oz (71 kg)     LABORATORY DATA:  I have reviewed the data as listed CMP Latest Ref Rng & Units 03/09/2021 02/16/2021 01/26/2021  Glucose 70 - 99 mg/dL 88 104(H) 95  BUN 6 - 20 mg/dL $Remove'13 12 10  'rYtUTwN$ Creatinine 0.44 - 1.00 mg/dL 0.70 0.78 0.71  Sodium 135 - 145 mmol/L 140 141 140  Potassium 3.5 - 5.1 mmol/L 3.8 4.0 3.8  Chloride 98 - 111 mmol/L 105 105 105  CO2 22 - 32 mmol/L $RemoveB'29 26 26  'qzMcDmnT$ Calcium 8.9 - 10.3 mg/dL 9.3 9.2 9.2  Total Protein 6.5 - 8.1 g/dL 7.5 7.8 7.5  Total Bilirubin 0.3 - 1.2 mg/dL 0.5 0.4 0.4  Alkaline Phos 38 - 126 U/L 81 96 92  AST 15 - 41 U/L 24 28  31  ALT 0 - 44 U/L 24 32 37    Lab Results  Component Value Date   WBC 4.4 03/09/2021   HGB 12.3 03/09/2021   HCT 37.0 03/09/2021   MCV 89.2 03/09/2021   PLT 179 03/09/2021   NEUTROABS 2.6 03/09/2021    ASSESSMENT & PLAN:  Malignant neoplasm of lower-outer quadrant of left breast of female, estrogen receptor positive ([place]) /09/2020: Normal mammogram September 2021.  December 2021: Following trauma to the breast she felt a lump but for variety of reasons work-up was postponed.  Mammogram revealed 2 tumors measuring 7 cm individually 4 cm and 3 cm, 3 axillary lymph nodes, biopsy revealed grade 3 IDC  ER 10%, PR 0%, Ki-67 40%, HER-2 +3+ by IHC, lymph node also positive.   Treatment Plan: 1. Neoadjuvant chemotherapy with TCH Perjeta 6 cycles followed by Kadcyla maintenance (because of residual disease after neoadjuvant chemo) for 1 year 2. left mastectomy 11/30/2020: Residual IDC 0.9 cm, focal high-grade DCIS, 5/12 lymph nodes are positive including 1 micromet, margins negative, ER 10%, PR 0%, HER2 3+, Ki-67 40% 3. Followed by adjuvant radiation therapy 4.  Followed by antiestrogen therapy 5.  Followed by neratinib MRI liver 07/11/20: Hepatic cysts Bone scan and CT CAP: No mets Breast MRI 06/28/2020: 2 large left breast masses each 0.5 cm, overall 7 cm, third contiguous mass 3 cm (could be a lymph node, abutting pectoralis muscle), 4 lymph nodes left axilla ---------------------------------------------------------------------------------------------------------------------------- Treatment plan: Maintenance therapy with Kadcyla, today is cycle 6 (until 07/13/2021), starting letrozole 03/30/2021   Adjuvant radiation therapy: We will be starting soon Residual cancer burden: Overall cancer cellularity: 10%, percentage of cancer that is in situ: 1%, residual cancer burden 3.761, RCB class III Kadcyla toxicities: Intermittent diarrhea doing much better.  Letrozole counseling: We discussed the risks and benefits of anti-estrogen therapy with aromatase inhibitors. These include but not limited to insomnia, hot flashes, mood changes, vaginal dryness, bone density loss, and weight gain. We strongly believe that the benefits far outweigh the risks. Patient understands these risks and consented to starting treatment. Planned treatment duration is 7 years.   I discussed with her that once Kadcyla is complete we will start her on neratinib as well. We will plan to obtain staging scans after Kadcyla is complete. Return to clinic every 3 weeks for Kadcyla, I will see the patient every 6 weeks.     No orders  of the defined types were placed in this encounter.  The patient has a good understanding of the overall plan. she agrees with it. she will call with any problems that may develop before the next visit here. Total time spent: 30 mins including face to face time and time spent for planning, charting and co-ordination of care   Harriette Ohara, MD 03/30/21

## 2021-03-30 NOTE — Patient Instructions (Signed)
[name]  Discharge Instructions: °Thank you for choosing [name] to provide your oncology and hematology care.  ° °If you have a lab appointment with the [name], please go directly to the [name] and check in at the registration area. °  °Wear comfortable clothing and clothing appropriate for easy access to any Portacath or PICC line.  ° °We strive to give you quality time with your provider. You may need to reschedule your appointment if you arrive late (15 or more minutes).  Arriving late affects you and other patients whose appointments are after yours.  Also, if you miss three or more appointments without notifying the office, you may be dismissed from the clinic at the provider’s discretion.    °  °For prescription refill requests, have your pharmacy contact our office and allow 72 hours for refills to be completed.   ° °Today you received the following chemotherapy and/or immunotherapy agents Kadcyla    °  °To help prevent nausea and vomiting after your treatment, we encourage you to take your nausea medication as directed. ° °BELOW ARE SYMPTOMS THAT SHOULD BE REPORTED IMMEDIATELY: °*FEVER GREATER THAN 100.4 F (38 °C) OR HIGHER °*CHILLS OR SWEATING °*NAUSEA AND VOMITING THAT IS NOT CONTROLLED WITH YOUR NAUSEA MEDICATION °*UNUSUAL SHORTNESS OF BREATH °*UNUSUAL BRUISING OR BLEEDING °*URINARY PROBLEMS (pain or burning when urinating, or frequent urination) °*BOWEL PROBLEMS (unusual diarrhea, constipation, pain near the anus) °TENDERNESS IN MOUTH AND THROAT WITH OR WITHOUT PRESENCE OF ULCERS (sore throat, sores in mouth, or a toothache) °UNUSUAL RASH, SWELLING OR PAIN  °UNUSUAL VAGINAL DISCHARGE OR ITCHING  ° °Items with * indicate a potential emergency and should be followed up as soon as possible or go to the Emergency Department if any problems should occur. ° °Please show the CHEMOTHERAPY ALERT CARD or IMMUNOTHERAPY ALERT CARD at check-in to the  Emergency Department and triage nurse. ° °Should you have questions after your visit or need to cancel or reschedule your appointment, please contact [name]  Dept: [phone]  and follow the prompts.  Office hours are 8:00 a.m. to 4:30 p.m. Monday - Friday. Please note that voicemails left after 4:00 p.m. may not be returned until the following business day.  We are closed weekends and major holidays. You have access to a nurse at all times for urgent questions. Please call the main number to the clinic Dept: [phone] and follow the prompts. ° ° °For any non-urgent questions, you may also contact your provider using MyChart. We now offer e-Visits for anyone 18 and older to request care online for non-urgent symptoms. For details visit [email or web address]. °  °Also download the MyChart app! Go to the app store, search "MyChart", open the app, select [place] [facility], and log in with your MyChart username and password. ° °Due to Covid, a mask is required upon entering the hospital/clinic. If you do not have a mask, one will be given to you upon arrival. For doctor visits, patients may have 1 support person aged 18 or older with them. For treatment visits, patients cannot have anyone with them due to current Covid guidelines and our immunocompromised population.  ° °

## 2021-04-07 ENCOUNTER — Telehealth: Payer: Self-pay

## 2021-04-07 ENCOUNTER — Ambulatory Visit: Payer: 59 | Admitting: Radiation Oncology

## 2021-04-07 NOTE — Telephone Encounter (Signed)
I called the patient today about her upcoming follow-up appointment in radiation oncology.   Given the state of the COVID-19 pandemic, concerning case numbers in our community, and guidance from [facility], I offered a phone assessment with the patient to determine if coming to the clinic was necessary. She accepted.  The patient denies any symptomatic concerns.  She reports mild fatigue, but feels it is more related to her systemic therapy than a lingering side effect from radiation. She reports she is starting to regain feeling to her left arm since her mastectomy, and denies any difficulty with range of motion to her left arm/shoulder. Specifically, she reports good healing of her skin in the radiation fields. She reports her skin never blistered or peeled, and remains intact. I recommended that she continue skin care by applying oil or lotion with vitamin E to the skin in the radiation fields, BID, for 2 more months.    Continue follow-up with medical oncology - follow-up is scheduled on 05/11/2021 with Dr. Nicholas Lose before one of her scheduled infusions.  I explained that yearly mammograms are important for patients with intact breast tissue, and physical exams are important after mastectomy for patients that cannot undergo mammography. Patient verbalized understanding and agreement.  I encouraged her to call if she had further questions or concerns about her healing. Otherwise, she will follow-up PRN in radiation oncology. Patient is pleased with this plan, and we will cancel her upcoming follow-up to reduce the risk of COVID-19 transmission.

## 2021-04-20 ENCOUNTER — Inpatient Hospital Stay: Payer: 59 | Attending: Hematology and Oncology

## 2021-04-20 ENCOUNTER — Inpatient Hospital Stay: Payer: 59

## 2021-04-20 ENCOUNTER — Other Ambulatory Visit: Payer: Self-pay

## 2021-04-20 VITALS — BP 112/68 | HR 72 | Temp 99.0°F | Resp 18 | Ht 69.0 in | Wt 156.5 lb

## 2021-04-20 DIAGNOSIS — Z95828 Presence of other vascular implants and grafts: Secondary | ICD-10-CM

## 2021-04-20 DIAGNOSIS — Z5112 Encounter for antineoplastic immunotherapy: Secondary | ICD-10-CM | POA: Insufficient documentation

## 2021-04-20 DIAGNOSIS — C50512 Malignant neoplasm of lower-outer quadrant of left female breast: Secondary | ICD-10-CM | POA: Diagnosis present

## 2021-04-20 DIAGNOSIS — Z79899 Other long term (current) drug therapy: Secondary | ICD-10-CM | POA: Insufficient documentation

## 2021-04-20 LAB — CBC WITH DIFFERENTIAL (CANCER CENTER ONLY)
Abs Immature Granulocytes: 0.02 10*3/uL (ref 0.00–0.07)
Basophils Absolute: 0.1 10*3/uL (ref 0.0–0.1)
Basophils Relative: 1 %
Eosinophils Absolute: 0.1 10*3/uL (ref 0.0–0.5)
Eosinophils Relative: 2 %
HCT: 38.1 % (ref 36.0–46.0)
Hemoglobin: 12.4 g/dL (ref 12.0–15.0)
Immature Granulocytes: 0 %
Lymphocytes Relative: 24 %
Lymphs Abs: 1.3 10*3/uL (ref 0.7–4.0)
MCH: 28.9 pg (ref 26.0–34.0)
MCHC: 32.5 g/dL (ref 30.0–36.0)
MCV: 88.8 fL (ref 80.0–100.0)
Monocytes Absolute: 0.6 10*3/uL (ref 0.1–1.0)
Monocytes Relative: 10 %
Neutro Abs: 3.3 10*3/uL (ref 1.7–7.7)
Neutrophils Relative %: 63 %
Platelet Count: 206 10*3/uL (ref 150–400)
RBC: 4.29 MIL/uL (ref 3.87–5.11)
RDW: 14.2 % (ref 11.5–15.5)
WBC Count: 5.3 10*3/uL (ref 4.0–10.5)
nRBC: 0 % (ref 0.0–0.2)

## 2021-04-20 LAB — CMP (CANCER CENTER ONLY)
ALT: 28 U/L (ref 0–44)
AST: 29 U/L (ref 15–41)
Albumin: 3.9 g/dL (ref 3.5–5.0)
Alkaline Phosphatase: 89 U/L (ref 38–126)
Anion gap: 4 — ABNORMAL LOW (ref 5–15)
BUN: 13 mg/dL (ref 6–20)
CO2: 31 mmol/L (ref 22–32)
Calcium: 9.8 mg/dL (ref 8.9–10.3)
Chloride: 103 mmol/L (ref 98–111)
Creatinine: 0.7 mg/dL (ref 0.44–1.00)
GFR, Estimated: >60 mL/min (ref >60)
Glucose, Bld: 86 mg/dL (ref 70–99)
Potassium: 3.9 mmol/L (ref 3.5–5.1)
Sodium: 138 mmol/L (ref 135–145)
Total Bilirubin: 0.4 mg/dL (ref 0.3–1.2)
Total Protein: 8.1 g/dL (ref 6.5–8.1)

## 2021-04-20 MED ORDER — SODIUM CHLORIDE 0.9 % IV SOLN: Freq: Once | INTRAVENOUS | Status: AC

## 2021-04-20 MED ORDER — SODIUM CHLORIDE 0.9% FLUSH
10.0000 mL | INTRAVENOUS | Status: DC | PRN
  Administered 2021-04-20: 10 mL

## 2021-04-20 MED ORDER — HEPARIN SOD (PORK) LOCK FLUSH 100 UNIT/ML IV SOLN
500.0000 [IU] | Freq: Once | INTRAVENOUS | Status: AC | PRN
  Administered 2021-04-20: 500 [IU]

## 2021-04-20 MED ORDER — ACETAMINOPHEN 325 MG PO TABS
650.0000 mg | ORAL_TABLET | Freq: Once | ORAL | Status: AC
  Administered 2021-04-20: 650 mg via ORAL
  Filled 2021-04-20: qty 2

## 2021-04-20 MED ORDER — SODIUM CHLORIDE 0.9 % IV SOLN
3.6000 mg/kg | Freq: Once | INTRAVENOUS | Status: AC
  Administered 2021-04-20: 240 mg via INTRAVENOUS
  Filled 2021-04-20: qty 5

## 2021-04-20 NOTE — Progress Notes (Signed)
Pt. declines to stay for 30 minute post observation. States she has been tolerating treatment well. Stable for discharge

## 2021-04-20 NOTE — Patient Instructions (Signed)
[facility]   Discharge Instructions: Thank you for choosing [facility] to provide your oncology and hematology care.   If you have a lab appointment with the Cancer Center, please go directly to the Cancer Center and check in at the registration area.   Wear comfortable clothing and clothing appropriate for easy access to any Portacath or PICC line.   We strive to give you quality time with your provider. You may need to reschedule your appointment if you arrive late (15 or more minutes).  Arriving late affects you and other patients whose appointments are after yours.  Also, if you miss three or more appointments without notifying the office, you may be dismissed from the clinic at the provider's discretion.      For prescription refill requests, have your pharmacy contact our office and allow 72 hours for refills to be completed.    Today you received the following chemotherapy and/or immunotherapy agent: Trastuzumab (Kadcyla)   To help prevent nausea and vomiting after your treatment, we encourage you to take your nausea medication as directed.  BELOW ARE SYMPTOMS THAT SHOULD BE REPORTED IMMEDIATELY: *FEVER GREATER THAN 100.4 F (38 C) OR HIGHER *CHILLS OR SWEATING *NAUSEA AND VOMITING THAT IS NOT CONTROLLED WITH YOUR NAUSEA MEDICATION *UNUSUAL SHORTNESS OF BREATH *UNUSUAL BRUISING OR BLEEDING *URINARY PROBLEMS (pain or burning when urinating, or frequent urination) *BOWEL PROBLEMS (unusual diarrhea, constipation, pain near the anus) TENDERNESS IN MOUTH AND THROAT WITH OR WITHOUT PRESENCE OF ULCERS (sore throat, sores in mouth, or a toothache) UNUSUAL RASH, SWELLING OR PAIN  UNUSUAL VAGINAL DISCHARGE OR ITCHING   Items with * indicate a potential emergency and should be followed up as soon as possible or go to the Emergency Department if any problems should occur.  Please show the CHEMOTHERAPY ALERT CARD or IMMUNOTHERAPY ALERT CARD at  check-in to the Emergency Department and triage nurse.  Should you have questions after your visit or need to cancel or reschedule your appointment, please contact [facility]  Dept: [phone]  and follow the prompts.  Office hours are 8:00 a.m. to 4:30 p.m. Monday - Friday. Please note that voicemails left after 4:00 p.m. may not be returned until the following business day.  We are closed weekends and major holidays. You have access to a nurse at all times for urgent questions. Please call the main number to the clinic Dept: [phone] and follow the prompts.   For any non-urgent questions, you may also contact your provider using MyChart. We now offer e-Visits for anyone 18 and older to request care online for non-urgent symptoms. For details visit [email or web address].   Also download the MyChart app! Go to the app store, search "MyChart", open the app, select [facility], and log in with your MyChart username and password.  Due to Covid, a mask is required upon entering the hospital/clinic. If you do not have a mask, one will be given to you upon arrival. For doctor visits, patients may have 1 support person aged 18 or older with them. For treatment visits, patients cannot have anyone with them due to current Covid guidelines and our immunocompromised population.   

## 2021-04-26 ENCOUNTER — Other Ambulatory Visit: Payer: Self-pay

## 2021-04-26 ENCOUNTER — Ambulatory Visit (HOSPITAL_COMMUNITY)
Admission: RE | Admit: 2021-04-26 | Discharge: 2021-04-26 | Disposition: A | Discharge status: Home / Self Care | Payer: 59 | Source: Ambulatory Visit | Attending: Internal Medicine | Admitting: Internal Medicine

## 2021-04-26 ENCOUNTER — Encounter (HOSPITAL_COMMUNITY): Payer: Self-pay | Admitting: Internal Medicine

## 2021-04-26 ENCOUNTER — Ambulatory Visit (HOSPITAL_BASED_OUTPATIENT_CLINIC_OR_DEPARTMENT_OTHER)
Admission: RE | Admit: 2021-04-26 | Discharge: 2021-04-26 | Disposition: A | Discharge status: Home / Self Care | Payer: 59 | Source: Ambulatory Visit | Attending: Internal Medicine | Admitting: Internal Medicine

## 2021-04-26 VITALS — BP 120/78 | HR 75 | Wt 158.2 lb

## 2021-04-26 DIAGNOSIS — Z9010 Acquired absence of unspecified breast and nipple: Secondary | ICD-10-CM | POA: Insufficient documentation

## 2021-04-26 DIAGNOSIS — Z0189 Encounter for other specified special examinations: Secondary | ICD-10-CM

## 2021-04-26 DIAGNOSIS — I340 Nonrheumatic mitral (valve) insufficiency: Secondary | ICD-10-CM | POA: Diagnosis not present

## 2021-04-26 DIAGNOSIS — C50512 Malignant neoplasm of lower-outer quadrant of left female breast: Secondary | ICD-10-CM

## 2021-04-26 DIAGNOSIS — Z01818 Encounter for other preprocedural examination: Secondary | ICD-10-CM | POA: Insufficient documentation

## 2021-04-26 DIAGNOSIS — Z79899 Other long term (current) drug therapy: Secondary | ICD-10-CM | POA: Insufficient documentation

## 2021-04-26 DIAGNOSIS — Z170 Estrogen receptor positive status [ER+]: Secondary | ICD-10-CM

## 2021-04-26 DIAGNOSIS — Z923 Personal history of irradiation: Secondary | ICD-10-CM | POA: Insufficient documentation

## 2021-04-26 DIAGNOSIS — Z853 Personal history of malignant neoplasm of breast: Secondary | ICD-10-CM | POA: Insufficient documentation

## 2021-04-26 LAB — ECHOCARDIOGRAM COMPLETE
AR max vel: 2.49 cm2
AV Area VTI: 2.47 cm2
AV Area mean vel: 2.46 cm2
AV Mean grad: 4 mmHg
AV Peak grad: 8 mmHg
Ao pk vel: 1.41 m/s
Area-P 1/2: 2.53 cm2
S' Lateral: 2.2 cm

## 2021-04-26 NOTE — Addendum Note (Signed)
Encounter addended by: Jerl Mina, RN on: 04/26/2021 11:12 AM  Actions taken: Clinical Note Signed

## 2021-04-26 NOTE — Patient Instructions (Signed)
Thank you for your visit today.  YOU HAVE GRADUATED FROM THE HEART FAILURE CLINIC. CONGRATULATIONS!!!  No changes to your medications.   If you have any questions or concerns before your next appointment please send [place] a message through [place] or call our office at [phone].    TO LEAVE A MESSAGE FOR THE NURSE SELECT OPTION 2, PLEASE LEAVE A MESSAGE INCLUDING: YOUR NAME DATE OF BIRTH CALL BACK NUMBER REASON FOR CALL**this is important as we prioritize the call backs  YOU WILL RECEIVE A CALL BACK THE SAME DAY AS LONG AS YOU CALL BEFORE 4:00 PM  At the [facility], you and your health needs are our priority. As part of our continuing mission to provide you with exceptional heart care, we have created designated Provider Care Teams. These Care Teams include your primary Cardiologist (physician) and Advanced Practice Providers (APPs- Physician Assistants and Nurse Practitioners) who all work together to provide you with the care you need, when you need it.   You may see any of the following providers on your designated Care Team at your next follow up: Dr Glori Bickers Dr Haynes Kerns, NP Lyda Jester, [place] [facility] [place], [place] Audry Riles, PharmD   Please be sure to bring in all your medications bottles to every appointment.

## 2021-04-26 NOTE — Progress Notes (Signed)
°  Echocardiogram 2D Echocardiogram has been performed.  [name] 04/26/2021, 10:53 AM

## 2021-04-26 NOTE — Progress Notes (Signed)
CARDIO-ONCOLOGY CLINIC NOTE  Referring Physician:Gudena, Vinay Oncology Primary Care: Hal Morales Primary Cardiologist: Arvilla Meres  HPI: [name] is 56 y.o. female with PMH of mild MR, stage IIB left breast cancer referred by Dr. Serena Croissant for enrollment into the Cardio-Oncology program.  Clinical stage from 06/28/2020: Left breast cancer Stage IIB (cT2, cN1, cM0, G3, ER+, PR-, HER2+).  Started on chemo DOCETAXEL + CARBOPLATIN + TRASTUZUMAB + PERTUZUMAB  (TCHP) Q21D.  Last infusion 10/05/20.  Last infusion of TCHP scheduled for August 10th. After that Herceptin Perjeta maintenance versus Kadcyla maintenance (based on response to neoadjuvant chemo) for 1 year.   S/p mastectomy on 11/30/20. Finished XRT. Getting Kadcyla every 3 weeks. Tolerating well. Has 4 more doses - finishes 4/27. No HF signs and symptoms. Walking a couple of miles. Works as an Airline pilot  Echo today 04/26/21: EF 60-65% Personally reviewed  Echo 2017 EF 60-65%  Echo 06/2020 EF 60-65%, GLS -19.3% ECHO 10/13/2020 EF 60-65%, GLS -15% but poor tracking, trivial MR Echo 11/22 EF 60-65% GLS -25%    Past Medical History:  Diagnosis Date   Anemia    Fibrocystic breast disease    Heart murmur    Mitral regurgitation    Mild by echo 09/17/11.   Neuromuscular disorder (HCC)    mild neuropathy   Varicose vein of leg     Current Outpatient Medications  Medication Sig Dispense Refill   Ado-Trastuzumab Emtansine (KADCYLA IV) Inject 1 Dose into the vein once a week. Q 3 weeks     Calcium Carb-Cholecalciferol (CALCIUM 500+D3 PO) Take 1 tablet by mouth daily in the afternoon.     letrozole (FEMARA) 2.5 MG tablet Take 1 tablet (2.5 mg total) by mouth daily. 90 tablet 3   No current facility-administered medications for this encounter.    No Known Allergies    Social History   Socioeconomic History   Marital status: Married    Spouse name: Not on file   Number of children: 2   Years of education: Not  on file   Highest education level: Not on file  Occupational History   Occupation: Gaffer  Tobacco Use   Smoking status: Former    Packs/day: 1.00    Years: 20.00    Pack years: 20.00    Types: Cigarettes    Quit date: 03/19/2002    Years since quitting: 19.1   Smokeless tobacco: Never  Vaping Use   Vaping Use: Never used  Substance and Sexual Activity   Alcohol use: No   Drug use: No   Sexual activity: Yes    Birth control/protection: None  Other Topics Concern   Not on file  Social History Narrative   Not on file   Social Determinants of Health   Financial Resource Strain: Not on file  Food Insecurity: Not on file  Transportation Needs: Not on file  Physical Activity: Not on file  Stress: Not on file  Social Connections: Not on file  Intimate Partner Violence: Not on file      Family History  Problem Relation Age of Onset   Cancer Father        Lung or esphageal   Hypertension Mother    Diabetes Mother    Coronary artery disease Paternal Uncle     Vitals:   04/26/21 1049  BP: 120/78  Pulse: 75  SpO2: 97%  Weight: 71.8 kg (158 lb 3.2 oz)    PHYSICAL EXAM: General:  Well appearing. No resp  difficulty HEENT: normal Neck: supple. no JVD. Carotids 2+ bilat; no bruits. No lymphadenopathy or thryomegaly appreciated. RIJ port Cor: PMI nondisplaced. Regular rate & rhythm. No rubs, gallops or murmurs. Lungs: clear Abdomen: soft, nontender, nondistended. No hepatosplenomegaly. No bruits or masses. Good bowel sounds. Extremities: no cyanosis, clubbing, rash, edema Neuro: alert & orientedx3, cranial nerves grossly intact. moves all 4 extremities w/o difficulty. Affect pleasant    ASSESSMENT & PLAN:  1. Stage IIB left  Breast Cancer -started on chemotherapy regimen outlined above on 06/2020  -ECHO 10/13/2020 EF 60-65%, GLS -15% but poor tracking, trivial MR.   - Echo 11/22 EF 60-65% GLS -25% - Echo today 04/26/21 EF 60-65% Personally reviewed - has  one dose of Kadcyla - no signs of chemotherapy induced toxicity. She is about to finish  - can f/u PRN  Glori Bickers, MD  11:04 AM

## 2021-04-27 ENCOUNTER — Encounter: Payer: Self-pay | Admitting: *Deleted

## 2021-05-10 NOTE — Progress Notes (Signed)
Patient Care Team: Charlynn Court, NP as PCP - General (Nurse Practitioner) Rockwell Germany, RN as Oncology Nurse Navigator Mauro Kaufmann, RN as Oncology Nurse Navigator  DIAGNOSIS:    ICD-10-CM   1. 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  December 2021: Following trauma to the breast she felt a lump but for variety of reasons work-up was postponed.  Mammogram revealed 2 tumors measuring 7 cm individually 4 cm and 3 cm, 3 axillary lymph nodes, biopsy revealed grade 3 IDC ER 10%, PR 0%, Ki-67 40%, HER-2 +3+ by IHC, lymph node also positive   06/28/2020 Cancer Staging   Staging form: Breast, AJCC 8th Edition - Clinical stage from 06/28/2020: Stage IIB (cT2, cN1, cM0, G3, ER+, PR-, HER2+) - Signed by Nicholas Lose, MD on 06/28/2020 Stage prefix: Initial diagnosis Histologic grading system: 3 grade system    07/13/2020 - 11/16/2020 Chemotherapy   [facility] Perjeta   11/30/2020 Surgery   left mastectomy: Residual IDC 0.9 cm, focal high-grade DCIS, 5/12 lymph nodes are positive including 1 micromet, margins negative, ER 10%, PR 0%, HER2 3+, Ki-67 40%   12/15/2020 -  Chemotherapy   Patient is on Treatment Plan : BREAST ADO-Trastuzumab Emtansine (Kadcyla) q21d     12/26/2020 Cancer Staging   Staging form: Breast, AJCC 8th Edition - Pathologic stage from 12/26/2020: No Stage Recommended (ypT1c, pN2a, cM0, G3, ER+, PR-, HER2+) - Signed by Eppie Gibson, MD on 12/26/2020 Stage prefix: Post-therapy Histologic grading system: 3 grade system      CHIEF COMPLIANT: Follow-up on Kadcyla  INTERVAL HISTORY: Janet [name] is a 56 y.o. with above-mentioned history of HER2 positive breast cancer who is  currently on adjuvant treatment with Kadcyla. She presents to the clinic today for treatment.  She is tolerating Kadcyla extremely well without any problems or concerns.  Denies any nausea vomiting or diarrhea.  She is started letrozole in January and appears to be tolerating it fairly well.  She has very occasional hot flashes.  Denies any arthralgias or myalgias.  Some weight gain was noted.  ALLERGIES:  has No Known Allergies.  MEDICATIONS:  Current Outpatient Medications  Medication Sig Dispense Refill   Ado-Trastuzumab Emtansine (KADCYLA IV) Inject 1 Dose into the vein once a week. Q 3 weeks     Calcium Carb-Cholecalciferol (CALCIUM 500+D3 PO) Take 1 tablet by mouth daily in the afternoon.     letrozole (FEMARA) 2.5 MG tablet Take 1 tablet (2.5 mg total) by mouth daily. 90 tablet 3   No current facility-administered medications for this visit.    PHYSICAL EXAMINATION: ECOG PERFORMANCE STATUS: 1 - Symptomatic but completely ambulatory  Vitals:   05/11/21 0906  BP: 108/66  Pulse: 94  Resp: 18  Temp: (!) 97.3 F (36.3 C)  SpO2: 99%   Filed Weights   05/11/21 0906  Weight: 158 lb 1.6 oz (71.7 kg)      LABORATORY DATA:  I have reviewed the data as listed CMP Latest Ref Rng & Units 05/11/2021 04/20/2021 03/30/2021  Glucose 70 - 99 mg/dL 89 86 89  BUN 6 - 20 mg/dL $Remove'11 13 10  'xKjryEd$ Creatinine 0.44 - 1.00 mg/dL 0.71 0.70 0.67  Sodium 135 -  145 mmol/L 138 138 137  Potassium 3.5 - 5.1 mmol/L 3.9 3.9 3.6  Chloride 98 - 111 mmol/L 103 103 101  CO2 22 - 32 mmol/L $RemoveB'30 31 29  'BTHbpSsZ$ Calcium 8.9 - 10.3 mg/dL 9.7 9.8 9.2  Total Protein 6.5 - 8.1 g/dL 7.9 8.1 7.8  Total Bilirubin 0.3 - 1.2 mg/dL 0.5 0.4 0.5  Alkaline Phos 38 - 126 U/L 92 89 97  AST 15 - 41 U/L $Remo'29 29 23  'Wtwkj$ ALT 0 - 44 U/L $Remo'23 28 16    'VQojO$ Lab Results  Component Value Date   WBC 5.0 05/11/2021   HGB 12.5 05/11/2021   HCT 37.0 05/11/2021   MCV 88.1 05/11/2021   PLT 175 05/11/2021   NEUTROABS 2.8 05/11/2021    ASSESSMENT & PLAN:   Malignant neoplasm of lower-outer quadrant of left breast of female, estrogen receptor positive ([place]) /09/2020: Normal mammogram September 2021.  December 2021: Following trauma to the breast she felt a lump but for variety of reasons work-up was postponed.  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   Treatment Plan: 1. Neoadjuvant chemotherapy with [facility] Perjeta 6 cycles followed by Kadcyla maintenance (because of residual disease after neoadjuvant chemo) for 1 year 2. left mastectomy 11/30/2020: Residual IDC 0.9 cm, focal high-grade DCIS, 5/12 lymph nodes are positive including 1 micromet, margins negative, ER 10%, PR 0%, HER2 3+, Ki-67 40% 3. Followed by adjuvant radiation therapy completed 03/02/2021 4.  Followed by antiestrogen therapy started 03/30/2021 5.  Followed by neratinib MRI liver 07/11/20: Hepatic cysts Bone scan and CT CAP: No mets Breast MRI 06/28/2020: 2 large left breast masses each 0.5 cm, overall 7 cm, third contiguous mass 3 cm (could be a lymph node, abutting pectoralis muscle), 4 lymph nodes left axilla ---------------------------------------------------------------------------------------------------------------------------- Treatment plan: Maintenance therapy with Kadcyla, today is cycle 6 (until 07/13/2021), started letrozole 03/30/2021 Kadcyla toxicities: Intermittent diarrhea doing much better.   Letrozole Toxicities: Mild hot flashes and weight gain  Neratinib counseling: Discussed risks and benefits of neratinib.  She will start neratinib after the [place] Drone is completed We discussed the risks and benefits of doing Signatera circulating tumor DNA testing. She is agreeable to do it.  Return to clinic every 3 weeks for Kadcyla  No orders of the defined types were placed in this encounter.  The patient has a good  understanding of the overall plan. she agrees with it. she will call with any problems that may develop before the next visit here.  Total time spent: 30 mins including face to face time and time spent for planning, charting and coordination of care  Rulon Eisenmenger, MD, MPH 05/11/2021  I, Thana Ates, am acting as scribe for Dr. Nicholas Lose.  I have reviewed the above documentation for accuracy and completeness, and I agree with the above.

## 2021-05-10 NOTE — Assessment & Plan Note (Signed)
/  09/2020:Normal mammogram September 2021. December 2021: Following trauma to the breast she felt a lump but for variety of reasons work-up was postponed. 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  Treatment Plan: 1. Neoadjuvant chemotherapy with [facility] Perjeta 6 cycles followed by Kadcyla maintenance (because of residual disease afterneoadjuvant chemo) for 1 year 2.left mastectomy 11/30/2020: Residual IDC 0.9 cm, focal high-grade DCIS,5/12 lymph nodes are positive including 1 micromet, margins negative, ER 10%, PR 0%, HER2 3+, Ki-67 40% 3. Followed by adjuvant radiation therapy 4.Followed by antiestrogen therapy 5.Followed by neratinib MRI liver 07/11/20: Hepatic cysts Bone scan and CT CAP: No mets Breast MRI 06/28/2020: 2 large left breast masses each 0.5 cm, overall 7 cm, third contiguous mass 3 cm (could be a lymph node, abutting pectoralis muscle), 4 lymph nodes left axilla ---------------------------------------------------------------------------------------------------------------------------- Treatment plan: Maintenance therapy with Kadcyla, today is cycle6(until 07/13/2021), starting letrozole 03/30/2021  Adjuvant radiation therapy: completed 03/02/21 Residual cancer burden: Overall cancer cellularity: 10%, percentage of cancer that is in situ: 1%, residual cancer burden 3.761, RCB class III Kadcyla toxicities:Intermittent diarrheadoing much better.  Letrozole Toxicities:  Neratinib:

## 2021-05-11 ENCOUNTER — Other Ambulatory Visit: Payer: Self-pay

## 2021-05-11 ENCOUNTER — Encounter: Payer: Self-pay | Admitting: Hematology and Oncology

## 2021-05-11 ENCOUNTER — Inpatient Hospital Stay: Payer: 59

## 2021-05-11 ENCOUNTER — Inpatient Hospital Stay (HOSPITAL_BASED_OUTPATIENT_CLINIC_OR_DEPARTMENT_OTHER): Payer: 59 | Admitting: Hematology and Oncology

## 2021-05-11 DIAGNOSIS — Z95828 Presence of other vascular implants and grafts: Secondary | ICD-10-CM

## 2021-05-11 DIAGNOSIS — Z170 Estrogen receptor positive status [ER+]: Secondary | ICD-10-CM | POA: Diagnosis not present

## 2021-05-11 DIAGNOSIS — C50512 Malignant neoplasm of lower-outer quadrant of left female breast: Secondary | ICD-10-CM

## 2021-05-11 DIAGNOSIS — Z5112 Encounter for antineoplastic immunotherapy: Secondary | ICD-10-CM | POA: Diagnosis not present

## 2021-05-11 LAB — CBC WITH DIFFERENTIAL (CANCER CENTER ONLY)
Abs Immature Granulocytes: 0.03 10*3/uL (ref 0.00–0.07)
Basophils Absolute: 0.1 10*3/uL (ref 0.0–0.1)
Basophils Relative: 1 %
Eosinophils Absolute: 0.1 10*3/uL (ref 0.0–0.5)
Eosinophils Relative: 3 %
HCT: 37 % (ref 36.0–46.0)
Hemoglobin: 12.5 g/dL (ref 12.0–15.0)
Immature Granulocytes: 1 %
Lymphocytes Relative: 28 %
Lymphs Abs: 1.4 10*3/uL (ref 0.7–4.0)
MCH: 29.8 pg (ref 26.0–34.0)
MCHC: 33.8 g/dL (ref 30.0–36.0)
MCV: 88.1 fL (ref 80.0–100.0)
Monocytes Absolute: 0.6 10*3/uL (ref 0.1–1.0)
Monocytes Relative: 12 %
Neutro Abs: 2.8 10*3/uL (ref 1.7–7.7)
Neutrophils Relative %: 55 %
Platelet Count: 175 10*3/uL (ref 150–400)
RBC: 4.2 MIL/uL (ref 3.87–5.11)
RDW: 13.9 % (ref 11.5–15.5)
WBC Count: 5 10*3/uL (ref 4.0–10.5)
nRBC: 0 % (ref 0.0–0.2)

## 2021-05-11 LAB — CMP (CANCER CENTER ONLY)
ALT: 23 U/L (ref 0–44)
AST: 29 U/L (ref 15–41)
Albumin: 4 g/dL (ref 3.5–5.0)
Alkaline Phosphatase: 92 U/L (ref 38–126)
Anion gap: 5 (ref 5–15)
BUN: 11 mg/dL (ref 6–20)
CO2: 30 mmol/L (ref 22–32)
Calcium: 9.7 mg/dL (ref 8.9–10.3)
Chloride: 103 mmol/L (ref 98–111)
Creatinine: 0.71 mg/dL (ref 0.44–1.00)
GFR, Estimated: >60 mL/min (ref >60)
Glucose, Bld: 89 mg/dL (ref 70–99)
Potassium: 3.9 mmol/L (ref 3.5–5.1)
Sodium: 138 mmol/L (ref 135–145)
Total Bilirubin: 0.5 mg/dL (ref 0.3–1.2)
Total Protein: 7.9 g/dL (ref 6.5–8.1)

## 2021-05-11 MED ORDER — SODIUM CHLORIDE 0.9 % IV SOLN: Freq: Once | INTRAVENOUS | Status: AC

## 2021-05-11 MED ORDER — HEPARIN SOD (PORK) LOCK FLUSH 100 UNIT/ML IV SOLN
500.0000 [IU] | Freq: Once | INTRAVENOUS | Status: AC | PRN
  Administered 2021-05-11: 500 [IU]

## 2021-05-11 MED ORDER — SODIUM CHLORIDE 0.9% FLUSH
10.0000 mL | INTRAVENOUS | Status: DC | PRN
  Administered 2021-05-11: 10 mL

## 2021-05-11 MED ORDER — SODIUM CHLORIDE 0.9 % IV SOLN
3.6000 mg/kg | Freq: Once | INTRAVENOUS | Status: AC
  Administered 2021-05-11: 240 mg via INTRAVENOUS
  Filled 2021-05-11: qty 8

## 2021-05-11 MED ORDER — ACETAMINOPHEN 325 MG PO TABS
650.0000 mg | ORAL_TABLET | Freq: Once | ORAL | Status: AC
  Administered 2021-05-11: 650 mg via ORAL
  Filled 2021-05-11: qty 2

## 2021-05-11 NOTE — Patient Instructions (Signed)
[facility]ICAL [facility]COLOG[facility]  Discharge Instructions: °Thank you for choosing [facility] to provide your oncology and hematology care.  ° °If you have a lab appointment with the Cancer Center, please go directly to the Cancer Center and check in at the registration area. °  °Wear comfortable clothing and clothing appropriate for easy access to any Portacath or PICC line.  ° °We strive to give you quality time with your provider. You may need to reschedule your appointment if you arrive late (15 or more minutes).  Arriving late affects you and other patients whose appointments are after yours.  Also, if you miss three or more appointments without notifying the office, you may be dismissed from the clinic at the provider’s discretion.    °  °For prescription refill requests, have your pharmacy contact our office and allow 72 hours for refills to be completed.   ° °Today you received the following chemotherapy and/or immunotherapy agents Kadcyla    °  °To help prevent nausea and vomiting after your treatment, we encourage you to take your nausea medication as directed. ° °BELOW ARE SYMPTOMS THAT SHOULD BE REPORTED IMMEDIATELY: °*FEVER GREATER THAN 100.4 F (38 °C) OR HIGHER °*CHILLS OR SWEATING °*NAUSEA AND VOMITING THAT IS NOT CONTROLLED WITH YOUR NAUSEA MEDICATION °*UNUSUAL SHORTNESS OF BREATH °*UNUSUAL BRUISING OR BLEEDING °*URINARY PROBLEMS (pain or burning when urinating, or frequent urination) °*BOWEL PROBLEMS (unusual diarrhea, constipation, pain near the anus) °TENDERNESS IN MOUTH AND THROAT WITH OR WITHOUT PRESENCE OF ULCERS (sore throat, sores in mouth, or a toothache) °UNUSUAL RASH, SWELLING OR PAIN  °UNUSUAL VAGINAL DISCHARGE OR ITCHING  ° °Items with * indicate a potential emergency and should be followed up as soon as possible or go to the Emergency Department if any problems should occur. ° °Please show the CHEMOTHERAPY ALERT CARD or IMMUNOTHERAPY ALERT CARD at check-in to the  Emergency Department and triage nurse. ° °Should you have questions after your visit or need to cancel or reschedule your appointment, please contact [facility]  Dept: [phone]  and follow the prompts.  Office hours are 8:00 a.m. to 4:30 p.m. Monday - Friday. Please note that voicemails left after 4:00 p.m. may not be returned until the following business day.  We are closed weekends and major holidays. You have access to a nurse at all times for urgent questions. Please call the main number to the clinic Dept: [phone] and follow the prompts. ° ° °For any non-urgent questions, you may also contact your provider using MyChart. We now offer e-Visits for anyone 18 and older to request care online for non-urgent symptoms. For details visit [email or web address]. °  °Also download the MyChart app! Go to the app store, search "MyChart", open the app, select [facility], and log in with your MyChart username and password. ° °Due to Covid, a mask is required upon entering the hospital/clinic. If you do not have a mask, one will be given to you upon arrival. For doctor visits, patients may have 1 support person aged 18 or older with them. For treatment visits, patients cannot have anyone with them due to current Covid guidelines and our immunocompromised population.  ° °

## 2021-06-01 ENCOUNTER — Other Ambulatory Visit: Payer: Self-pay

## 2021-06-01 ENCOUNTER — Inpatient Hospital Stay: Payer: 59

## 2021-06-01 ENCOUNTER — Inpatient Hospital Stay: Payer: 59 | Attending: Hematology and Oncology

## 2021-06-01 VITALS — BP 115/77 | HR 72 | Temp 98.1°F | Resp 16 | Wt 160.0 lb

## 2021-06-01 DIAGNOSIS — Z79899 Other long term (current) drug therapy: Secondary | ICD-10-CM | POA: Insufficient documentation

## 2021-06-01 DIAGNOSIS — Z170 Estrogen receptor positive status [ER+]: Secondary | ICD-10-CM

## 2021-06-01 DIAGNOSIS — C50512 Malignant neoplasm of lower-outer quadrant of left female breast: Secondary | ICD-10-CM | POA: Insufficient documentation

## 2021-06-01 DIAGNOSIS — Z5112 Encounter for antineoplastic immunotherapy: Secondary | ICD-10-CM | POA: Diagnosis present

## 2021-06-01 DIAGNOSIS — Z95828 Presence of other vascular implants and grafts: Secondary | ICD-10-CM

## 2021-06-01 LAB — CBC WITH DIFFERENTIAL (CANCER CENTER ONLY)
Abs Immature Granulocytes: 0.01 10*3/uL (ref 0.00–0.07)
Basophils Absolute: 0.1 10*3/uL (ref 0.0–0.1)
Basophils Relative: 1 %
Eosinophils Absolute: 0.1 10*3/uL (ref 0.0–0.5)
Eosinophils Relative: 3 %
HCT: 39.7 % (ref 36.0–46.0)
Hemoglobin: 13.1 g/dL (ref 12.0–15.0)
Immature Granulocytes: 0 %
Lymphocytes Relative: 27 %
Lymphs Abs: 1.3 10*3/uL (ref 0.7–4.0)
MCH: 29.2 pg (ref 26.0–34.0)
MCHC: 33 g/dL (ref 30.0–36.0)
MCV: 88.4 fL (ref 80.0–100.0)
Monocytes Absolute: 0.5 10*3/uL (ref 0.1–1.0)
Monocytes Relative: 11 %
Neutro Abs: 2.9 10*3/uL (ref 1.7–7.7)
Neutrophils Relative %: 58 %
Platelet Count: 165 10*3/uL (ref 150–400)
RBC: 4.49 MIL/uL (ref 3.87–5.11)
RDW: 13.6 % (ref 11.5–15.5)
WBC Count: 4.9 10*3/uL (ref 4.0–10.5)
nRBC: 0 % (ref 0.0–0.2)

## 2021-06-01 LAB — CMP (CANCER CENTER ONLY)
ALT: 23 U/L (ref 0–44)
AST: 29 U/L (ref 15–41)
Albumin: 4.2 g/dL (ref 3.5–5.0)
Alkaline Phosphatase: 86 U/L (ref 38–126)
Anion gap: 5 (ref 5–15)
BUN: 13 mg/dL (ref 6–20)
CO2: 30 mmol/L (ref 22–32)
Calcium: 9.9 mg/dL (ref 8.9–10.3)
Chloride: 104 mmol/L (ref 98–111)
Creatinine: 0.69 mg/dL (ref 0.44–1.00)
GFR, Estimated: >60 mL/min (ref >60)
Glucose, Bld: 95 mg/dL (ref 70–99)
Potassium: 3.8 mmol/L (ref 3.5–5.1)
Sodium: 139 mmol/L (ref 135–145)
Total Bilirubin: 0.6 mg/dL (ref 0.3–1.2)
Total Protein: 8.3 g/dL — ABNORMAL HIGH (ref 6.5–8.1)

## 2021-06-01 MED ORDER — ACETAMINOPHEN 325 MG PO TABS
650.0000 mg | ORAL_TABLET | Freq: Once | ORAL | Status: AC
  Administered 2021-06-01: 650 mg via ORAL
  Filled 2021-06-01: qty 2

## 2021-06-01 MED ORDER — SODIUM CHLORIDE 0.9% FLUSH
10.0000 mL | INTRAVENOUS | Status: DC | PRN
  Administered 2021-06-01: 10 mL

## 2021-06-01 MED ORDER — SODIUM CHLORIDE 0.9 % IV SOLN: Freq: Once | INTRAVENOUS | Status: AC

## 2021-06-01 MED ORDER — HEPARIN SOD (PORK) LOCK FLUSH 100 UNIT/ML IV SOLN
500.0000 [IU] | Freq: Once | INTRAVENOUS | Status: AC | PRN
  Administered 2021-06-01: 500 [IU]

## 2021-06-01 MED ORDER — SODIUM CHLORIDE 0.9 % IV SOLN
3.6000 mg/kg | Freq: Once | INTRAVENOUS | Status: AC
  Administered 2021-06-01: 240 mg via INTRAVENOUS
  Filled 2021-06-01: qty 8

## 2021-06-01 NOTE — Patient Instructions (Signed)
[facility]COL[facility]   °Discharge Instructions: °Thank you for choosing [facility] to provide your oncology and hematology care.  ° °If you have a lab appointment with the Cancer Center, please go directly to the Cancer Center and check in at the registration area. °  °Wear comfortable clothing and clothing appropriate for easy access to any Portacath or PICC line.  ° °We strive to give you quality time with your provider. You may need to reschedule your appointment if you arrive late (15 or more minutes).  Arriving late affects you and other patients whose appointments are after yours.  Also, if you miss three or more appointments without notifying the office, you may be dismissed from the clinic at the provider’s discretion.    °  °For prescription refill requests, have your pharmacy contact our office and allow 72 hours for refills to be completed.   ° °Today you received the following chemotherapy and/or immunotherapy agents: ado-trastuzumab emtansine    °  °To help prevent nausea and vomiting after your treatment, we encourage you to take your nausea medication as directed. ° °BELOW ARE SYMPTOMS THAT SHOULD BE REPORTED IMMEDIATELY: °*FEVER GREATER THAN 100.4 F (38 °C) OR HIGHER °*CHILLS OR SWEATING °*NAUSEA AND VOMITING THAT IS NOT CONTROLLED WITH YOUR NAUSEA MEDICATION °*UNUSUAL SHORTNESS OF BREATH °*UNUSUAL BRUISING OR BLEEDING °*URINARY PROBLEMS (pain or burning when urinating, or frequent urination) °*BOWEL PROBLEMS (unusual diarrhea, constipation, pain near the anus) °TENDERNESS IN MOUTH AND THROAT WITH OR WITHOUT PRESENCE OF ULCERS (sore throat, sores in mouth, or a toothache) °UNUSUAL RASH, SWELLING OR PAIN  °UNUSUAL VAGINAL DISCHARGE OR ITCHING  ° °Items with * indicate a potential emergency and should be followed up as soon as possible or go to the Emergency Department if any problems should occur. ° °Please show the CHEMOTHERAPY ALERT CARD or IMMUNOTHERAPY ALERT  CARD at check-in to the Emergency Department and triage nurse. ° °Should you have questions after your visit or need to cancel or reschedule your appointment, please contact [facility]  Dept: [phone]  and follow the prompts.  Office hours are 8:00 a.m. to 4:30 p.m. Monday - Friday. Please note that voicemails left after 4:00 p.m. may not be returned until the following business day.  We are closed weekends and major holidays. You have access to a nurse at all times for urgent questions. Please call the main number to the clinic Dept: [phone] and follow the prompts. ° ° °For any non-urgent questions, you may also contact your provider using MyChart. We now offer e-Visits for anyone 18 and older to request care online for non-urgent symptoms. For details visit [email or web address]. °  °Also download the MyChart app! Go to the app store, search "MyChart", open the app, select , and log in with your MyChart username and password. ° °Due to Covid, a mask is required upon entering the hospital/clinic. If you do not have a mask, one will be given to you upon arrival. For doctor visits, patients may have 1 support person aged 18 or older with them. For treatment visits, patients cannot have anyone with them due to current Covid guidelines and our immunocompromised population.  ° °

## 2021-06-21 NOTE — Progress Notes (Signed)
? ?Patient Care Team: ?Charlynn Court, NP as PCP - General (Nurse Practitioner) ?Rockwell Germany, RN as Oncology Nurse Navigator ?Mauro Kaufmann, RN as Oncology Nurse Navigator ? ?DIAGNOSIS:  ?Encounter Diagnosis  ?Name Primary?  ? Malignant neoplasm of lower-outer quadrant of left breast of female, estrogen receptor positive ([place])   ? ? ?SUMMARY OF ONCOLOGIC HISTORY: ?Oncology History  ?Malignant neoplasm of lower-outer quadrant of left breast of female, estrogen receptor positive ([place])  ?06/23/2020 Initial Diagnosis  ? Normal mammogram September 2021.  December 2021: Following trauma to the breast she felt a lump but for variety of reasons work-up was postponed.  Mammogram revealed 2 tumors measuring 7 cm individually 4 cm and 3 cm, 3 axillary lymph nodes, biopsy revealed grade 3 IDC ER 10%, PR 0%, Ki-67 40%, HER-2 +3+ by IHC, lymph node also positive ?  ?06/28/2020 Cancer Staging  ? Staging form: Breast, AJCC 8th Edition ?- Clinical stage from 06/28/2020: Stage IIB (cT2, cN1, cM0, G3, ER+, PR-, HER2+) - Signed by Nicholas Lose, MD on 06/28/2020 ?Stage prefix: Initial diagnosis ?Histologic grading system: 3 grade system ? ?  ?07/13/2020 - 11/16/2020 Chemotherapy  ? [place] Perjeta ?  ?11/30/2020 Surgery  ? left mastectomy: Residual IDC 0.9 cm, focal high-grade DCIS, 5/12 lymph nodes are positive including 1 micromet, margins negative, ER 10%, PR 0%, HER2 3+, Ki-67 40% ?  ?12/15/2020 -  Chemotherapy  ? Patient is on Treatment Plan : BREAST ADO-Trastuzumab Emtansine (Kadcyla) q21d  ?   ?12/26/2020 Cancer Staging  ? Staging form: Breast, AJCC 8th Edition ?- Pathologic stage from 12/26/2020: No Stage Recommended (ypT1c, pN2a, cM0, G3, ER+, PR-, HER2+) - Signed by Eppie Gibson, MD on 12/26/2020 ?Stage prefix: Post-therapy ?Histologic grading system: 3 grade system ? ?  ? ? ?CHIEF COMPLIANT: Follow-up on Kadcyla ?Cycle 10 ? ?INTERVAL HISTORY: Janet [name] is a 56 y.o. with above-mentioned history of HER2 positive breast  cancer who is currently on adjuvant treatment with Kadcyla. She presents to the clinic today for treatment.  She is tolerating cancer extremely well without any problems or concerns.  Denies any nausea or vomiting.  Very occasional diarrhea. ? ? ?ALLERGIES:  has No Known Allergies. ? ?MEDICATIONS:  ?Current Outpatient Medications  ?Medication Sig Dispense Refill  ? [START ON 08/17/2021] Neratinib Maleate (NERLYNX) 40 MG tablet Take 3 tablets (120 mg total) by mouth daily. Take with food. 90 tablet 0  ? Ado-Trastuzumab Emtansine (KADCYLA IV) Inject 1 Dose into the vein once a week. Q 3 weeks    ? Calcium Carb-Cholecalciferol (CALCIUM 500+D3 PO) Take 1 tablet by mouth daily in the afternoon.    ? letrozole (FEMARA) 2.5 MG tablet Take 1 tablet (2.5 mg total) by mouth daily. 90 tablet 3  ? ?No current facility-administered medications for this visit.  ? ? ?PHYSICAL EXAMINATION: ?ECOG PERFORMANCE STATUS: 1 - Symptomatic but completely ambulatory ? ?Vitals:  ? 06/22/21 0924  ?BP: 110/60  ?Pulse: 77  ?Resp: 18  ?Temp: 97.9 ?F (36.6 ?C)  ?SpO2: 100%  ? ?Filed Weights  ? 06/22/21 0924  ?Weight: 161 lb 3.2 oz (73.1 kg)  ? ?  ? ?LABORATORY DATA:  ?I have reviewed the data as listed ? ?  Latest Ref Rng & Units 06/01/2021  ?  8:54 AM 05/11/2021  ?  8:53 AM 04/20/2021  ?  8:37 AM  ?CMP  ?Glucose 70 - 99 mg/dL 95   89   86    ?BUN 6 - 20 mg/dL 13  11   13    ?Creatinine 0.44 - 1.00 mg/dL 0.69   0.71   0.70    ?Sodium 135 - 145 mmol/L 139   138   138    ?Potassium 3.5 - 5.1 mmol/L 3.8   3.9   3.9    ?Chloride 98 - 111 mmol/L 104   103   103    ?CO2 22 - 32 mmol/L $RemoveB'30   30   31    'LNJariaA$ ?Calcium 8.9 - 10.3 mg/dL 9.9   9.7   9.8    ?Total Protein 6.5 - 8.1 g/dL 8.3   7.9   8.1    ?Total Bilirubin 0.3 - 1.2 mg/dL 0.6   0.5   0.4    ?Alkaline Phos 38 - 126 U/L 86   92   89    ?AST 15 - 41 U/L $Remo'29   29   29    'EGLnh$ ?ALT 0 - 44 U/L $Remo'23   23   28    'nqbhQ$ ? ? ?Lab Results  ?Component Value Date  ? WBC 4.5 06/22/2021  ? HGB 13.0 06/22/2021  ? HCT 38.4 06/22/2021   ? MCV 87.7 06/22/2021  ? PLT 155 06/22/2021  ? NEUTROABS 2.4 06/22/2021  ? ? ?ASSESSMENT & PLAN:  ?Malignant neoplasm of lower-outer quadrant of left breast of female, estrogen receptor positive ([place]) ?06/23/2020: Normal mammogram September 2021.  December 2021: Following trauma to the breast she felt a lump but for variety of reasons work-up was postponed.  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 ?  ?Treatment Plan: ?1. Neoadjuvant chemotherapy with [place] Perjeta ?6 cycles followed by Kadcyla maintenance (because of residual disease after neoadjuvant chemo) for 1 year ?2. left mastectomy 11/30/2020: Residual IDC 0.9 cm, focal high-grade DCIS, 5/12 lymph nodes are positive including 1 micromet, margins negative, ER 10%, PR 0%, HER2 3+, Ki-67 40% ?3. Followed by adjuvant radiation therapy completed 03/02/2021 ?4.  Followed by antiestrogen therapy started 03/30/2021 ?5.  Followed by neratinib ?MRI liver 07/11/20: Hepatic cysts ?Bone scan and CT CAP: No mets ?Breast MRI 06/28/2020: 2 large left breast masses each 0.5 cm, overall 7 cm, third contiguous mass 3 cm (could be a lymph node, abutting pectoralis muscle), 4 lymph nodes left axilla ?---------------------------------------------------------------------------------------------------------------------------- ?Treatment plan: Maintenance therapy with Kadcyla, today is cycle 10 (until 07/13/2021), started letrozole 03/30/2021 ?Kadcyla toxicities: Intermittent diarrhea doing much better. ?  ?Letrozole Toxicities: Mild hot flashes and weight gain ?  ?Neratinib counseling: Discussed risks and benefits of neratinib.  She will start neratinib after the [place] Drone is completed (approximately August 17, 2021) she will start with 3 tablets a day and slowly titrated upwards. ? ?We discussed the risks and benefits of doing Signatera  circulating tumor DNA testing. ?She is agreeable to do it. ?  ?Return to clinic in 3 weeks for her last treatment of Kadcyla. ?She will come in on June 15 for follow-up with Jenny Reichmann to discuss tolerance and adjustment of dosage of neratinib. ? ? ? ?No orders of the defined types were placed in this encounter. ? ?The patient has a good understanding of the overall plan. she agrees with it. she will call with any problems that may develop before the next visit here. ?Total time spent: 30 mins including face to face time and time spent for planning, charting  and co-ordination of care ? ? Harriette Ohara, MD ?06/22/21 ? ? ? I Gardiner Coins am scribing for Dr. Lindi Adie ? ?I have reviewed the above documentation for accuracy and completeness, and I agree with the above. ?  ?

## 2021-06-22 ENCOUNTER — Other Ambulatory Visit: Payer: Self-pay

## 2021-06-22 ENCOUNTER — Encounter: Payer: Self-pay | Admitting: Hematology and Oncology

## 2021-06-22 ENCOUNTER — Inpatient Hospital Stay: Payer: 59

## 2021-06-22 ENCOUNTER — Telehealth: Payer: Self-pay | Admitting: Pharmacy Technician

## 2021-06-22 ENCOUNTER — Other Ambulatory Visit (HOSPITAL_COMMUNITY): Payer: Self-pay

## 2021-06-22 ENCOUNTER — Inpatient Hospital Stay (HOSPITAL_BASED_OUTPATIENT_CLINIC_OR_DEPARTMENT_OTHER): Payer: 59 | Admitting: Hematology and Oncology

## 2021-06-22 ENCOUNTER — Inpatient Hospital Stay: Payer: 59 | Attending: Hematology and Oncology

## 2021-06-22 ENCOUNTER — Telehealth: Payer: Self-pay

## 2021-06-22 DIAGNOSIS — Z79899 Other long term (current) drug therapy: Secondary | ICD-10-CM | POA: Insufficient documentation

## 2021-06-22 DIAGNOSIS — Z95828 Presence of other vascular implants and grafts: Secondary | ICD-10-CM

## 2021-06-22 DIAGNOSIS — Z170 Estrogen receptor positive status [ER+]: Secondary | ICD-10-CM

## 2021-06-22 DIAGNOSIS — Z5112 Encounter for antineoplastic immunotherapy: Secondary | ICD-10-CM | POA: Insufficient documentation

## 2021-06-22 DIAGNOSIS — C50512 Malignant neoplasm of lower-outer quadrant of left female breast: Secondary | ICD-10-CM

## 2021-06-22 LAB — CBC WITH DIFFERENTIAL (CANCER CENTER ONLY)
Abs Immature Granulocytes: 0.04 10*3/uL (ref 0.00–0.07)
Basophils Absolute: 0.1 10*3/uL (ref 0.0–0.1)
Basophils Relative: 1 %
Eosinophils Absolute: 0.1 10*3/uL (ref 0.0–0.5)
Eosinophils Relative: 3 %
HCT: 38.4 % (ref 36.0–46.0)
Hemoglobin: 13 g/dL (ref 12.0–15.0)
Immature Granulocytes: 1 %
Lymphocytes Relative: 30 %
Lymphs Abs: 1.3 10*3/uL (ref 0.7–4.0)
MCH: 29.7 pg (ref 26.0–34.0)
MCHC: 33.9 g/dL (ref 30.0–36.0)
MCV: 87.7 fL (ref 80.0–100.0)
Monocytes Absolute: 0.6 10*3/uL (ref 0.1–1.0)
Monocytes Relative: 13 %
Neutro Abs: 2.4 10*3/uL (ref 1.7–7.7)
Neutrophils Relative %: 52 %
Platelet Count: 155 10*3/uL (ref 150–400)
RBC: 4.38 MIL/uL (ref 3.87–5.11)
RDW: 13.4 % (ref 11.5–15.5)
WBC Count: 4.5 10*3/uL (ref 4.0–10.5)
nRBC: 0 % (ref 0.0–0.2)

## 2021-06-22 LAB — CMP (CANCER CENTER ONLY)
ALT: 21 U/L (ref 0–44)
AST: 28 U/L (ref 15–41)
Albumin: 4.1 g/dL (ref 3.5–5.0)
Alkaline Phosphatase: 92 U/L (ref 38–126)
Anion gap: 6 (ref 5–15)
BUN: 12 mg/dL (ref 6–20)
CO2: 29 mmol/L (ref 22–32)
Calcium: 9.7 mg/dL (ref 8.9–10.3)
Chloride: 105 mmol/L (ref 98–111)
Creatinine: 0.66 mg/dL (ref 0.44–1.00)
GFR, Estimated: >60 mL/min (ref >60)
Glucose, Bld: 98 mg/dL (ref 70–99)
Potassium: 3.8 mmol/L (ref 3.5–5.1)
Sodium: 140 mmol/L (ref 135–145)
Total Bilirubin: 0.6 mg/dL (ref 0.3–1.2)
Total Protein: 8.1 g/dL (ref 6.5–8.1)

## 2021-06-22 MED ORDER — HEPARIN SOD (PORK) LOCK FLUSH 100 UNIT/ML IV SOLN
500.0000 [IU] | Freq: Once | INTRAVENOUS | Status: AC | PRN
  Administered 2021-06-22: 500 [IU]

## 2021-06-22 MED ORDER — SODIUM CHLORIDE 0.9 % IV SOLN: Freq: Once | INTRAVENOUS | Status: AC

## 2021-06-22 MED ORDER — NERATINIB MALEATE 40 MG PO TABS
120.0000 mg | ORAL_TABLET | Freq: Every day | ORAL | 0 refills | Status: DC
  Filled 2021-06-22: qty 90, 30d supply, fill #0

## 2021-06-22 MED ORDER — SODIUM CHLORIDE 0.9% FLUSH
10.0000 mL | INTRAVENOUS | Status: DC | PRN
  Administered 2021-06-22: 10 mL

## 2021-06-22 MED ORDER — SODIUM CHLORIDE 0.9 % IV SOLN
3.6000 mg/kg | Freq: Once | INTRAVENOUS | Status: AC
  Administered 2021-06-22: 240 mg via INTRAVENOUS
  Filled 2021-06-22: qty 8

## 2021-06-22 MED ORDER — ACETAMINOPHEN 325 MG PO TABS
650.0000 mg | ORAL_TABLET | Freq: Once | ORAL | Status: AC
  Administered 2021-06-22: 650 mg via ORAL
  Filled 2021-06-22: qty 2

## 2021-06-22 NOTE — Assessment & Plan Note (Addendum)
06/23/2020:?Normal mammogram September 2021. ?December 2021: Following trauma to the breast she felt a lump but for variety of reasons work-up was postponed. ?Mammogram revealed 2 tumors measuring 7 cm individually 4 cm and 3 cm, 3 axillary lymph nodes, biopsy revealed grade 3 IDC ER 10%, PR 0%, Ki-67 40%, HER-2 +3+ by IHC, lymph node also positive. ?? ?Treatment Plan: ?1. Neoadjuvant chemotherapy with [place] Perjeta ?6 cycles followed by Kadcyla maintenance (because of residual disease after?neoadjuvant chemo) for 1 year ?2.?left mastectomy 11/30/2020: Residual IDC 0.9 cm, focal high-grade DCIS,?5/12 lymph nodes are positive including 1 micromet, margins negative, ER 10%, PR 0%, HER2 3+, Ki-67 40% ?3. Followed by adjuvant radiation therapy completed 03/02/2021 ?4.??Followed by antiestrogen therapy started 03/30/2021 ?5.??Followed by neratinib ?MRI liver 07/11/20: Hepatic cysts ?Bone scan and CT CAP: No mets ?Breast MRI 06/28/2020: 2 large left breast masses each 0.5 cm, overall 7 cm, third contiguous mass 3 cm (could be a lymph node, abutting pectoralis muscle), 4 lymph nodes left axilla ?---------------------------------------------------------------------------------------------------------------------------- ?Treatment plan: Maintenance therapy with Kadcyla, today is cycle?10?(until 07/13/2021), started letrozole 03/30/2021 ?Kadcyla toxicities:?Intermittent diarrhea?doing much better. ?? ?Letrozole Toxicities: Mild hot flashes and weight gain ?? ?Neratinib counseling: Discussed risks and benefits of neratinib.  She will start neratinib after the [place] Drone is completed (approximately August 17, 2021) she will start with 3 tablets a day and slowly titrated upwards. ? ?We discussed the risks and benefits of doing Signatera circulating tumor DNA testing. ?She is agreeable to do it. ?? ?Return to clinic in 3 weeks for her last treatment of Kadcyla. ?She will come in on June 15 for follow-up with Jenny Reichmann to discuss tolerance and  adjustment of dosage of neratinib. ?

## 2021-06-22 NOTE — Telephone Encounter (Signed)
Oral Oncology Pharmacist Encounter ? ?Received new prescription for neratinib (Nerlynx) for the treatment of breast cancer, planned duration until disease progression or unacceptable toxicity or for a total of 1 year. ? ?Labs from 06/22/2021 assessed, no interventions needed. ? ?Current medication list in Epic reviewed, DDIs with Nerlynx identified: ? ?Evaluated chart and no patient barriers to medication adherence noted.  ? ?Patient agreement for treatment documented in MD note on 06/22/2021. ? ?Prescription has been e-scribed to the [facility] for benefits analysis and approval. ? ?Oral Oncology Clinic will continue to follow for insurance authorization, copayment issues, initial counseling and start date. ? ?Drema Halon, PharmD ?Hematology/Oncology Clinical Pharmacist ?[facility] ?[phone] ?06/22/2021 10:29 AM ? ?

## 2021-06-22 NOTE — Telephone Encounter (Signed)
Oral Oncology Patient Advocate Encounter ? ?Prior Authorization for Nerlynx '40mg'$  has been approved.   ? ?PA# Key: [id number] - PA Case ID: [id number] ?Effective dates: 06/22/21 through 06/23/22 ? ?Patient must fill through [facility]. Please send copay card details in prescription when sending to pharmacy. ? ?Oral Oncology Clinic will continue to follow.   ?

## 2021-06-22 NOTE — Progress Notes (Signed)
Patient declined to stay for 30 minute post Kadcyla observation. 

## 2021-06-22 NOTE — Telephone Encounter (Signed)
Received new start notification for  Nerlynx '40mg'$  . Will update as we work through the benefits process.  ? ?June 2023 start date ? ?Submitted a Prior Authorization request to [facility] for  Nerlynx '40mg'$   via CoverMyMeds. Will update once we receive a response. ? ? ?Key: [id number] - PA Case ID: [id number]  ? ? ?

## 2021-06-23 ENCOUNTER — Telehealth: Payer: Self-pay | Admitting: Hematology and Oncology

## 2021-06-23 NOTE — Telephone Encounter (Signed)
Scheduled appointment per 4/6 los. Patient is aware. ?

## 2021-06-27 ENCOUNTER — Telehealth: Payer: Self-pay | Admitting: *Deleted

## 2021-06-27 NOTE — Telephone Encounter (Signed)
Received after hours message from pt with complaint of UTI symptoms.  RN placed call to pt and pt states she was seen in Urgent care today and was prescribed Cipro p.o for UTI. Pt states no other needs at this time.  ?

## 2021-07-05 ENCOUNTER — Encounter (HOSPITAL_COMMUNITY): Payer: Self-pay

## 2021-07-13 ENCOUNTER — Inpatient Hospital Stay: Payer: 59

## 2021-07-13 ENCOUNTER — Other Ambulatory Visit: Payer: Self-pay

## 2021-07-13 VITALS — BP 125/81 | HR 78 | Temp 98.0°F | Resp 18 | Wt 165.0 lb

## 2021-07-13 DIAGNOSIS — Z5112 Encounter for antineoplastic immunotherapy: Secondary | ICD-10-CM | POA: Diagnosis not present

## 2021-07-13 DIAGNOSIS — Z95828 Presence of other vascular implants and grafts: Secondary | ICD-10-CM

## 2021-07-13 DIAGNOSIS — Z170 Estrogen receptor positive status [ER+]: Secondary | ICD-10-CM

## 2021-07-13 LAB — CMP (CANCER CENTER ONLY)
ALT: 21 U/L (ref 0–44)
AST: 30 U/L (ref 15–41)
Albumin: 4 g/dL (ref 3.5–5.0)
Alkaline Phosphatase: 89 U/L (ref 38–126)
Anion gap: 4 — ABNORMAL LOW (ref 5–15)
BUN: 13 mg/dL (ref 6–20)
CO2: 29 mmol/L (ref 22–32)
Calcium: 9.8 mg/dL (ref 8.9–10.3)
Chloride: 107 mmol/L (ref 98–111)
Creatinine: 0.68 mg/dL (ref 0.44–1.00)
GFR, Estimated: >60 mL/min (ref >60)
Glucose, Bld: 94 mg/dL (ref 70–99)
Potassium: 3.8 mmol/L (ref 3.5–5.1)
Sodium: 140 mmol/L (ref 135–145)
Total Bilirubin: 0.5 mg/dL (ref 0.3–1.2)
Total Protein: 7.6 g/dL (ref 6.5–8.1)

## 2021-07-13 LAB — CBC WITH DIFFERENTIAL (CANCER CENTER ONLY)
Abs Immature Granulocytes: 0.01 10*3/uL (ref 0.00–0.07)
Basophils Absolute: 0 10*3/uL (ref 0.0–0.1)
Basophils Relative: 1 %
Eosinophils Absolute: 0.1 10*3/uL (ref 0.0–0.5)
Eosinophils Relative: 3 %
HCT: 36.9 % (ref 36.0–46.0)
Hemoglobin: 12.5 g/dL (ref 12.0–15.0)
Immature Granulocytes: 0 %
Lymphocytes Relative: 30 %
Lymphs Abs: 1.3 10*3/uL (ref 0.7–4.0)
MCH: 30 pg (ref 26.0–34.0)
MCHC: 33.9 g/dL (ref 30.0–36.0)
MCV: 88.5 fL (ref 80.0–100.0)
Monocytes Absolute: 0.5 10*3/uL (ref 0.1–1.0)
Monocytes Relative: 13 %
Neutro Abs: 2.3 10*3/uL (ref 1.7–7.7)
Neutrophils Relative %: 53 %
Platelet Count: 163 10*3/uL (ref 150–400)
RBC: 4.17 MIL/uL (ref 3.87–5.11)
RDW: 13.7 % (ref 11.5–15.5)
WBC Count: 4.3 10*3/uL (ref 4.0–10.5)
nRBC: 0 % (ref 0.0–0.2)

## 2021-07-13 MED ORDER — SODIUM CHLORIDE 0.9% FLUSH
10.0000 mL | INTRAVENOUS | Status: DC | PRN
  Administered 2021-07-13: 10 mL

## 2021-07-13 MED ORDER — ACETAMINOPHEN 325 MG PO TABS
650.0000 mg | ORAL_TABLET | Freq: Once | ORAL | Status: AC
  Administered 2021-07-13: 650 mg via ORAL
  Filled 2021-07-13: qty 2

## 2021-07-13 MED ORDER — SODIUM CHLORIDE 0.9 % IV SOLN
3.6000 mg/kg | Freq: Once | INTRAVENOUS | Status: AC
  Administered 2021-07-13: 240 mg via INTRAVENOUS
  Filled 2021-07-13: qty 8

## 2021-07-13 MED ORDER — SODIUM CHLORIDE 0.9 % IV SOLN: Freq: Once | INTRAVENOUS | Status: AC

## 2021-07-13 MED ORDER — HEPARIN SOD (PORK) LOCK FLUSH 100 UNIT/ML IV SOLN
500.0000 [IU] | Freq: Once | INTRAVENOUS | Status: AC | PRN
  Administered 2021-07-13: 500 [IU]

## 2021-07-13 NOTE — Progress Notes (Signed)
Patient has had kadcyla many times before and opted out of staying for her 30 minute observation period. VSS and no complaints- ?

## 2021-07-13 NOTE — Patient Instructions (Signed)
[facility] MEDICAL ONCOLOGY   °Discharge Instructions: °Thank you for choosing [facility] to provide your oncology and hematology care.  ° °If you have a lab appointment with the Cancer Center, please go directly to the Cancer Center and check in at the registration area. °  °Wear comfortable clothing and clothing appropriate for easy access to any Portacath or PICC line.  ° °We strive to give you quality time with your provider. You may need to reschedule your appointment if you arrive late (15 or more minutes).  Arriving late affects you and other patients whose appointments are after yours.  Also, if you miss three or more appointments without notifying the office, you may be dismissed from the clinic at the provider’s discretion.    °  °For prescription refill requests, have your pharmacy contact our office and allow 72 hours for refills to be completed.   ° °Today you received the following chemotherapy and/or immunotherapy agents: ado-trastuzumab emtansine    °  °To help prevent nausea and vomiting after your treatment, we encourage you to take your nausea medication as directed. ° °BELOW ARE SYMPTOMS THAT SHOULD BE REPORTED IMMEDIATELY: °*FEVER GREATER THAN 100.4 F (38 °C) OR HIGHER °*CHILLS OR SWEATING °*NAUSEA AND VOMITING THAT IS NOT CONTROLLED WITH YOUR NAUSEA MEDICATION °*UNUSUAL SHORTNESS OF BREATH °*UNUSUAL BRUISING OR BLEEDING °*URINARY PROBLEMS (pain or burning when urinating, or frequent urination) °*BOWEL PROBLEMS (unusual diarrhea, constipation, pain near the anus) °TENDERNESS IN MOUTH AND THROAT WITH OR WITHOUT PRESENCE OF ULCERS (sore throat, sores in mouth, or a toothache) °UNUSUAL RASH, SWELLING OR PAIN  °UNUSUAL VAGINAL DISCHARGE OR ITCHING  ° °Items with * indicate a potential emergency and should be followed up as soon as possible or go to the Emergency Department if any problems should occur. ° °Please show the CHEMOTHERAPY ALERT CARD or IMMUNOTHERAPY ALERT  CARD at check-in to the Emergency Department and triage nurse. ° °Should you have questions after your visit or need to cancel or reschedule your appointment, please contact [facility]ICAL [facility]COL[facility]  Dept: [phone]  and follow the prompts.  Office hours are 8:00 a.m. to 4:30 p.m. Monday - Friday. Please note that voicemails left after 4:00 p.m. may not be returned until the following business day.  We are closed weekends and major holidays. You have access to a nurse at all times for urgent questions. Please call the main number to the clinic Dept: [phone] and follow the prompts. ° ° °For any non-urgent questions, you may also contact your provider using MyChart. We now offer e-Visits for anyone 18 and older to request care online for non-urgent symptoms. For details visit [email or web address]. °  °Also download the MyChart app! Go to the app store, search "MyChart", open the app, select [facility], and log in with your MyChart username and password. ° °Due to Covid, a mask is required upon entering the hospital/clinic. If you do not have a mask, one will be given to you upon arrival. For doctor visits, patients may have 1 support person aged 56 or older with them. For treatment visits, patients cannot have anyone with them due to current Covid guidelines and our immunocompromised population.  ° °

## 2021-07-13 NOTE — Progress Notes (Signed)
Calculated dose of Kadcyla is slightly outside of 10% (242 mg vs 240 mg). Will continue 240 mg dose and will follow closely and adjust based on weight changes. RN aware.  ? ?Larene Beach, PharmD ? ?

## 2021-07-18 ENCOUNTER — Encounter: Payer: Self-pay | Admitting: *Deleted

## 2021-07-20 MED ORDER — NERATINIB MALEATE 40 MG PO TABS: 120.0000 mg | ORAL_TABLET | Freq: Every day | ORAL | 0 refills | Status: DC

## 2021-07-24 ENCOUNTER — Other Ambulatory Visit: Payer: Self-pay | Admitting: Hematology and Oncology

## 2021-07-24 ENCOUNTER — Encounter: Payer: Self-pay | Admitting: Hematology and Oncology

## 2021-07-24 ENCOUNTER — Other Ambulatory Visit: Payer: Self-pay | Admitting: *Deleted

## 2021-07-24 DIAGNOSIS — C50512 Malignant neoplasm of lower-outer quadrant of left female breast: Secondary | ICD-10-CM

## 2021-07-24 MED ORDER — LETROZOLE 2.5 MG PO TABS: 2.5000 mg | ORAL_TABLET | Freq: Every day | ORAL | 3 refills | Status: DC

## 2021-07-24 NOTE — Therapy (Signed)
?OUTPATIENT PHYSICAL THERAPY ONCOLOGY EVALUATION ? ?Patient Name: Janet [name] ?MRN: [id number] ?DOB:[birth year], 56 y.o., female ?Today's Date: 07/25/2021 ? ? PT End of Session - 07/25/21 1129   ? ? Visit Number 2   ? Number of Visits 4   ? Date for PT Re-Evaluation 09/05/21   ? PT Start Time 0800   ? PT Stop Time 1517   ? PT Time Calculation (min) 65 min   ? Activity Tolerance Patient tolerated treatment well   ? Behavior During Therapy [facility] for tasks assessed/performed   ? ?  ?  ? ?  ? ? ?Past Medical History:  ?Diagnosis Date  ? Anemia   ? Fibrocystic breast disease   ? Heart murmur   ? Mitral regurgitation   ? Mild by echo 09/17/11.  ? Neuromuscular disorder ([place])   ? mild neuropathy  ? Varicose vein of leg   ? ?Past Surgical History:  ?Procedure Laterality Date  ? BREAST BIOPSY    ? BREAST BIOPSY    ? BREAST LUMPECTOMY    ? Fibroadenoma of the left breast  ? DILATATION & CURETTAGE/HYSTEROSCOPY WITH MYOSURE N/A 01/26/2016  ? Procedure: DILATATION & [place] WITH MYOSURE;  Surgeon: Allyn Kenner, DO;  Location: [facility];  Service: Gynecology;  Laterality: N/A;  ? HYSTEROSCOPY WITH NOVASURE N/A 01/26/2016  ? Procedure: HYSTEROSCOPY WITH NOVASURE;  Surgeon: Allyn Kenner, DO;  Location: [facility];  Service: Gynecology;  Laterality: N/A;  ? PORTACATH PLACEMENT Right 07/12/2020  ? Procedure: INSERTION PORT-A-CATH RIGHT INTERNAL JUGULAR;  Surgeon: Rolm Bookbinder, MD;  Location: [facility];  Service: General;  Laterality: Right;  ? RADIOACTIVE SEED GUIDED AXILLARY SENTINEL LYMPH NODE Left 11/30/2020  ? Procedure: LEFT AXILLARY NODE SEED GUIDED EXCISION;  Surgeon: Rolm Bookbinder, MD;  Location: [place];  Service: General;  Laterality: Left;  ? SIMPLE MASTECTOMY WITH AXILLARY SENTINEL NODE BIOPSY Left 11/30/2020  ? Procedure: LEFT SIMPLE MASTECTOMY WITH AXILLARY SENTINEL NODE BIOPSY;  Surgeon: Rolm Bookbinder, MD;  Location: [place];  Service: General;  Laterality: Left;  ? VARICOSE VEIN SURGERY    ? with lasor R-leg  ?  WISDOM TOOTH EXTRACTION    ? ?Patient Active Problem List  ? Diagnosis Date Noted  ? S/P mastectomy, right 11/30/2020  ? S/P mastectomy, left 11/30/2020  ? Port-A-Cath in place 07/19/2020  ? Malignant neoplasm of lower-outer quadrant of left breast of female, estrogen receptor positive ([place]) 06/28/2020  ? Mitral regurgitation 11/14/2011  ? ? ?PCP: Lucita Lora, NP ? ?REFERRING PROVIDER: Dr. Donne Hazel ? ?REFERRING DIAG: Left breast cancer ? ?THERAPY DIAG:  ?S/P mastectomy, left ? ?Aftercare following surgery for neoplasm ? ?ONSET DATE: 11/30/20 ? ?SUBJECTIVE                                                                                                                                                                                          ? ?  SUBJECTIVE STATEMENT:My surgeon saw cording when I got my port out last week.  It is just in the armpit.  My ring may be a bit tighter but I think its the joint pain from the antiestrogens ? ?PERTINENT HISTORY:  Completion of neoadjuvant chemotherapy followed by Left mastectomy without reconstruction with 5/12 LN positive. Radiation completed.  ER positive, PR negative, HER2 positive.   ? ?PAIN:  ?Are you having pain? Yes ?NPRS scale: 2-3/10 ?Pain location: intermittent arm pain ?Pain orientation: Left  ?PAIN TYPE: aching and dull ?Pain description: intermittent  ?Aggravating factors: after sleeping, and certain reaches ?Relieving factors: change positions ? ?PRECAUTIONS: Lt lymphedema risk ? ?WEIGHT BEARING RESTRICTIONS No ? ?FALLS:  ?Has patient fallen in last 6 months? No ? ?LIVING ENVIRONMENT: ?Lives with: lives with their spouse ?Lives in: House/apartment ? ?OCCUPATION: works full time desk work ? ?LEISURE: I am trying and I need to do more  ? ?HAND DOMINANCE : right  ? ?PRIOR LEVEL OF FUNCTION: Independent ? ?PATIENT GOALS is there anything I should do for cording ? ? ?OBJECTIVE ? ?COGNITION: ? Overall cognitive status: Within functional limits for tasks  assessed  ? ?PALPATION: Tightness Lt pectoralis and in axilla - cording vs muscle tightness ? ?OBSERVATIONS / OTHER ASSESSMENTS: tight pectoralis band axillary border ? ?POSTURE: WNL ? ?UPPER EXTREMITY AROM/PROM: ? ?A/PROM RIGHT  07/25/2021 ?  ?Shoulder extension 60  ?Shoulder flexion 160  ?Shoulder abduction 168  ?Shoulder internal rotation   ?Shoulder external rotation 95  ?   (Blank rows = not tested) ? ?A/PROM LEFT  12/29/20 07/25/21  ?Shoulder extension 70 65  ?Shoulder flexion 160 with pull 155  ?Shoulder abduction 160 with pull 155 - some ulnar nerve numbness   ?Shoulder internal rotation    ?Shoulder external rotation 95 85  ?  (Blank rows = not tested) ? ? ?CERVICAL AROM: ?All within normal limits:  ? ?UPPER EXTREMITY STRENGTH:  ? ? ?LYMPHEDEMA ASSESSMENTS:  ? ?[place] RIGHT  07/25/21  ?10 cm proximal to olecranon process 28.3  ?Olecranon process 25.5  ?10 cm proximal to ulnar styloid process 21.2  ?Just proximal to ulnar styloid process 15.7  ?Across hand at thumb web space 19  ?At base of 2nd digit 6.4  ?(Blank rows = not tested) ? ?[facility] LEFT  12/29/21 07/25/21  ?10 cm proximal to olecranon process 27.6 28  ?Olecranon process 24.5 25  ?10 cm proximal to ulnar styloid process 21 22  ?Just proximal to ulnar styloid process 15.4 15.5  ?Across hand at thumb web space 19 18.7  ?At base of 2nd digit 6.2 6.4  ?(Blank rows = not tested) ? ? ?QUICK DASH SURVEY: 14% ? ?LDEX: 2.8 ? ?TODAY'S TREATMENT  ?MT: Supine STM and cording / pectoralis MFR with flexion, abduction, and D2 PROM. STM latissimus in flexion overhead. ?TE: education on new HEP with performance of each: ? LTR with goalpost arms 6" x 4 bil ? Supine yellow band horizontal abduction and diagonals bil x 10 ? Standing single arm chest stretch on wall  ? ? ?PATIENT EDUCATION:  ?Education details: use of SOZO at this time and how it is not sensitive with just one reading but as she has no signs of edema since surgery it can be used as a baseline.  HEP,  POC ?Person educated: Patient ?Education method: Explanation, Demonstration, Tactile cues, Verbal cues, and Handouts ?Education comprehension: verbalized understanding and returned demonstration ? ? ?HOME EXERCISE PROGRAM: ?LTR with goalpost arms 6" x 4 bil ?  Supine yellow band horizontal abduction and diagonals bil x 10 ?Standing single arm chest stretch on wall  ? ?ASSESSMENT: ? ?CLINICAL IMPRESSION: ?Patient is a 56 y.o. female who was seen today for physical therapy evaluation and treatment of her continued left upper quadrant tightness post mastectomy and radiation. Pt was seen here originally post op and had regained her AROM and was starting radiation.  Now a few months post radiation she has lost some of her AROM with more feelings of pull.  Tightness in axilla vs cording.  Good incision mobility and pt is performing scar work.  Pt lives 22min away and would like to do limited in person sessions.  Will see again in 1 week as she did see benefit from MT today and schedule more PRN  ? ? ?OBJECTIVE IMPAIRMENTS decreased knowledge of use of DME and decreased ROM.  ? ?ACTIVITY LIMITATIONS community activity.  ? ?PERSONAL FACTORS 3+ comorbidities: radiation hx, chemo hx, ALND,   are also affecting patient's functional outcome.  ? ? ?REHAB POTENTIAL: Excellent ? ?CLINICAL DECISION MAKING: Stable/uncomplicated ? ?EVALUATION COMPLEXITY: Low ? ?GOALS: ?Goals reviewed with patient? Yes ? ?LONG TERM GOALS: Target date: 09/05/2021 ? ?Pt will return to baseline AROM equal to the opposite UE ?Baseline:  ?Goal status: INITIAL ? ?2.  Pt will be ind with final HEP to include strength and mobility  ?Baseline:  ?Goal status: INITIAL ? ?PLAN: ?PT FREQUENCY: 1x/week ? ?PT DURATION: 6 weeks ? ?PLANNED INTERVENTIONS: Therapeutic exercises, Patient/Family education, Joint mobilization, and Manual therapy ? ?PLAN FOR NEXT SESSION: how are stretches,  PROM/MFR upper left quadrant to improve ROM ? ? ?Stark Bray, PT ?07/25/2021, 11:30  AM ? ?

## 2021-07-25 ENCOUNTER — Encounter: Payer: Self-pay | Admitting: Rehabilitation

## 2021-07-25 ENCOUNTER — Ambulatory Visit: Payer: 59 | Attending: General Surgery | Admitting: Rehabilitation

## 2021-07-25 DIAGNOSIS — Z483 Aftercare following surgery for neoplasm: Secondary | ICD-10-CM | POA: Diagnosis not present

## 2021-07-25 DIAGNOSIS — Z170 Estrogen receptor positive status [ER+]: Secondary | ICD-10-CM | POA: Insufficient documentation

## 2021-07-25 DIAGNOSIS — C50512 Malignant neoplasm of lower-outer quadrant of left female breast: Secondary | ICD-10-CM | POA: Diagnosis not present

## 2021-07-25 DIAGNOSIS — Z9012 Acquired absence of left breast and nipple: Secondary | ICD-10-CM | POA: Diagnosis not present

## 2021-07-25 DIAGNOSIS — C50519 Malignant neoplasm of lower-outer quadrant of unspecified female breast: Secondary | ICD-10-CM | POA: Insufficient documentation

## 2021-07-27 ENCOUNTER — Other Ambulatory Visit: Payer: Self-pay | Admitting: Hematology and Oncology

## 2021-08-01 ENCOUNTER — Other Ambulatory Visit: Payer: Self-pay | Admitting: *Deleted

## 2021-08-01 ENCOUNTER — Encounter: Payer: Self-pay | Admitting: Rehabilitation

## 2021-08-01 ENCOUNTER — Ambulatory Visit: Payer: 59 | Admitting: Rehabilitation

## 2021-08-01 DIAGNOSIS — C50519 Malignant neoplasm of lower-outer quadrant of unspecified female breast: Secondary | ICD-10-CM | POA: Diagnosis not present

## 2021-08-01 DIAGNOSIS — Z483 Aftercare following surgery for neoplasm: Secondary | ICD-10-CM

## 2021-08-01 DIAGNOSIS — Z9012 Acquired absence of left breast and nipple: Secondary | ICD-10-CM

## 2021-08-01 MED ORDER — CALCIUM CARB-CHOLECALCIFEROL 500-10 MG-MCG PO TABS: 1.0000 | ORAL_TABLET | Freq: Every day | ORAL | 3 refills | Status: AC

## 2021-08-01 NOTE — Therapy (Signed)
?OUTPATIENT PHYSICAL THERAPY ONCOLOGY EVALUATION ? ?Patient Name: Janet [name] ?MRN: [id number] ?DOB:05-Jul-1965, 56 y.o., female ?Today's Date: 08/01/2021 ? ? PT End of Session - 08/01/21 0847   ? ? Visit Number 3   ? Number of Visits 4   ? Date for PT Re-Evaluation 09/05/21   ? PT Start Time 0800   ? PT Stop Time 0845   ? PT Time Calculation (min) 45 min   ? Activity Tolerance Patient tolerated treatment well   ? Behavior During Therapy [facility] for tasks assessed/performed   ? ?  ?  ? ?  ? ? ? ?Past Medical History:  ?Diagnosis Date  ? Anemia   ? Fibrocystic breast disease   ? Heart murmur   ? Mitral regurgitation   ? Mild by echo 09/17/11.  ? Neuromuscular disorder ([place])   ? mild neuropathy  ? Varicose vein of leg   ? ?Past Surgical History:  ?Procedure Laterality Date  ? BREAST BIOPSY    ? BREAST BIOPSY    ? BREAST LUMPECTOMY    ? Fibroadenoma of the left breast  ? DILATATION & CURETTAGE/HYSTEROSCOPY WITH MYOSURE N/A 01/26/2016  ? Procedure: DILATATION & Pollyann Glen WITH MYOSURE;  Surgeon: Allyn Kenner, DO;  Location: [facility];  Service: Gynecology;  Laterality: N/A;  ? HYSTEROSCOPY WITH NOVASURE N/A 01/26/2016  ? Procedure: HYSTEROSCOPY WITH NOVASURE;  Surgeon: Allyn Kenner, DO;  Location: [facility];  Service: Gynecology;  Laterality: N/A;  ? PORTACATH PLACEMENT Right 07/12/2020  ? Procedure: INSERTION PORT-A-CATH RIGHT INTERNAL JUGULAR;  Surgeon: Rolm Bookbinder, MD;  Location: [place];  Service: General;  Laterality: Right;  ? RADIOACTIVE SEED GUIDED AXILLARY SENTINEL LYMPH NODE Left 11/30/2020  ? Procedure: LEFT AXILLARY NODE SEED GUIDED EXCISION;  Surgeon: Rolm Bookbinder, MD;  Location: [facility];  Service: General;  Laterality: Left;  ? SIMPLE MASTECTOMY WITH AXILLARY SENTINEL NODE BIOPSY Left 11/30/2020  ? Procedure: LEFT SIMPLE MASTECTOMY WITH AXILLARY SENTINEL NODE BIOPSY;  Surgeon: Rolm Bookbinder, MD;  Location: [facility];  Service: General;  Laterality: Left;  ? VARICOSE VEIN SURGERY    ? with lasor R-leg   ? WISDOM TOOTH EXTRACTION    ? ?Patient Active Problem List  ? Diagnosis Date Noted  ? S/P mastectomy, right 11/30/2020  ? S/P mastectomy, left 11/30/2020  ? Port-A-Cath in place 07/19/2020  ? Malignant neoplasm of lower-outer quadrant of left breast of female, estrogen receptor positive ([place]) 06/28/2020  ? Mitral regurgitation 11/14/2011  ? ? ?PCP: Lucita Lora, NP ? ?REFERRING PROVIDER: Dr. Donne Hazel ? ?REFERRING DIAG: Left breast cancer ? ?THERAPY DIAG:  ?S/P mastectomy, left ? ?Aftercare following surgery for neoplasm ? ?ONSET DATE: 11/30/20 ? ?SUBJECTIVE                                                                                                                                                                                          ? ?  SUBJECTIVE STATEMENT: Seems to not be quite as tight  ? ?PERTINENT HISTORY:  Completion of neoadjuvant chemotherapy followed by Left mastectomy without reconstruction with 5/12 LN positive. Radiation completed.  ER positive, PR negative, HER2 positive.   ? ?PAIN:  ?Are you having pain? No ? ?PRECAUTIONS: Lt lymphedema risk ? ?PATIENT GOALS is there anything I should do for cording ? ? ?OBJECTIVE ? ?PALPATION: Tightness Lt pectoralis and in axilla - cording vs muscle tightness ? ?OBSERVATIONS / OTHER ASSESSMENTS: tight pectoralis band axillary border ? ?POSTURE: WNL ? ?UPPER EXTREMITY AROM/PROM: ? ?A/PROM RIGHT  08/01/2021 ?  ?Shoulder extension 60  ?Shoulder flexion 160  ?Shoulder abduction 168  ?Shoulder internal rotation   ?Shoulder external rotation 95  ?   (Blank rows = not tested) ? ?A/PROM LEFT  12/29/20 07/25/21 08/01/21  ?Shoulder extension 70 65 65  ?Shoulder flexion 160 with pull 155 164  ?Shoulder abduction 160 with pull 155 - some ulnar nerve numbness  165 - pectoralis tightness only   ?Shoulder internal rotation     ?Shoulder external rotation 95 85 90  ?  (Blank rows = not tested) ? ? ?CERVICAL AROM: ?All within normal limits:  ? ?UPPER EXTREMITY STRENGTH:   ? ? ?LYMPHEDEMA ASSESSMENTS:  ? ?[place] RIGHT  07/25/21  ?10 cm proximal to olecranon process 28.3  ?Olecranon process 25.5  ?10 cm proximal to ulnar styloid process 21.2  ?Just proximal to ulnar styloid process 15.7  ?Across hand at thumb web space 19  ?At base of 2nd digit 6.4  ?(Blank rows = not tested) ? ?[facility] LEFT  12/29/21 07/25/21  ?10 cm proximal to olecranon process 27.6 28  ?Olecranon process 24.5 25  ?10 cm proximal to ulnar styloid process 21 22  ?Just proximal to ulnar styloid process 15.4 15.5  ?Across hand at thumb web space 19 18.7  ?At base of 2nd digit 6.2 6.4  ?(Blank rows = not tested) ? ? ?QUICK DASH SURVEY: 14% ? ?LDEX: 2.8 ? ?TODAY'S TREATMENT  ?08/01/21 ?Redid AROM and status check ?Pulleys 43min flexion and abduction ?Ball flexion stretch 5" x 5 and abduction stretch right only 5" x 5  ?Supine yellow band horizontal abduction x 10 and diagonals bil x 10 ?Supine yellow bil ER x 10 ?MT: Supine STM and cording / pectoralis MFR with flexion, abduction, and D2 PROM. STM latissimus in flexion overhead and in sidelying ? ?07/25/21 ?MT: Supine STM and cording / pectoralis MFR with flexion, abduction, and D2 PROM. STM latissimus in flexion overhead. ?TE: education on new HEP with performance of each: ? LTR with goalpost arms 6" x 4 bil ? Supine yellow band horizontal abduction and diagonals bil x 10 ? Standing single arm chest stretch on wall  ? ? ?PATIENT EDUCATION:  ?Education details: use of SOZO at this time and how it is not sensitive with just one reading but as she has no signs of edema since surgery it can be used as a baseline.  HEP, POC ?Person educated: Patient ?Education method: Explanation, Demonstration, Tactile cues, Verbal cues, and Handouts ?Education comprehension: verbalized understanding and returned demonstration ? ? ?HOME EXERCISE PROGRAM: ?LTR with goalpost arms 6" x 4 bil ?Supine yellow band horizontal abduction and diagonals bil x 10 ?Standing single arm chest stretch on wall   ? ?ASSESSMENT: ? ?CLINICAL IMPRESSION: ?Pt reports feeling great after first session and like the tightness has not returned.  Due to distance from the clinic and return to motion equal to the opposite side  without axilla pull she will attempt no more visits but knows whe can return when needed.   ? ?OBJECTIVE IMPAIRMENTS decreased knowledge of use of DME and decreased ROM.  ? ?ACTIVITY LIMITATIONS community activity.  ? ?PERSONAL FACTORS 3+ comorbidities: radiation hx, chemo hx, ALND,   are also affecting patient's functional outcome.  ? ? ?REHAB POTENTIAL: Excellent ? ?CLINICAL DECISION MAKING: Stable/uncomplicated ? ?EVALUATION COMPLEXITY: Low ? ?GOALS: ?Goals reviewed with patient? Yes ? ?LONG TERM GOALS: Target date: 09/12/2021 ? ?Pt will return to baseline AROM equal to the opposite UE ?Baseline:  ?Goal status: MET ? ?2.  Pt will be ind with final HEP to include strength and mobility  ?Baseline:  ?Goal status: MET ? ?PLAN: ?PT FREQUENCY: 1x/week ? ?PT DURATION: 6 weeks ? ?PLANNED INTERVENTIONS: Therapeutic exercises, Patient/Family education, Joint mobilization, and Manual therapy ? ?PLAN FOR NEXT SESSION: how are stretches,  PROM/MFR upper left quadrant to improve ROM ? ? ?Stark Bray, PT ?08/01/2021, 8:51 AM ? ?PHYSICAL THERAPY DISCHARGE SUMMARY ? ?Visits from Start of Care: 2 ? ?Current functional level related to goals / functional outcomes: ?See above ?  ?Remaining deficits: ?Muscle tightness post radiation ?  ?Education / Equipment: ?HEP  ?Plan: ?Patient agrees to discharge.   Patient is being discharged due to meeting the stated rehab goals.    ? ? ? ?

## 2021-08-04 ENCOUNTER — Other Ambulatory Visit: Payer: Self-pay | Admitting: *Deleted

## 2021-08-07 ENCOUNTER — Other Ambulatory Visit: Payer: Self-pay | Admitting: *Deleted

## 2021-08-07 DIAGNOSIS — Z9189 Other specified personal risk factors, not elsewhere classified: Secondary | ICD-10-CM

## 2021-08-07 DIAGNOSIS — C50512 Malignant neoplasm of lower-outer quadrant of left female breast: Secondary | ICD-10-CM

## 2021-08-07 DIAGNOSIS — Z79811 Long term (current) use of aromatase inhibitors: Secondary | ICD-10-CM

## 2021-08-08 ENCOUNTER — Other Ambulatory Visit: Payer: Self-pay | Admitting: *Deleted

## 2021-08-08 NOTE — Progress Notes (Signed)
Pt requesting Bone Density to be preformed at [facility].  Orders updated for imaging location to reflect [facility].

## 2021-08-12 ENCOUNTER — Other Ambulatory Visit: Payer: Self-pay | Admitting: Hematology and Oncology

## 2021-08-12 DIAGNOSIS — Z170 Estrogen receptor positive status [ER+]: Secondary | ICD-10-CM

## 2021-08-15 ENCOUNTER — Encounter: Payer: Self-pay | Admitting: Hematology and Oncology

## 2021-08-16 NOTE — Telephone Encounter (Addendum)
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  Last Herceptin infusion: 07/13/2021   Counseled patient on administration, dosing, side effects, monitoring, drug-food interactions, safe handling, storage, and disposal.  Nerlynx will be initiated on a dose titration schedule. Antidiarrheal prophylaxis will not be used.   Patient will use loperamide as needed if symptoms of diarrhea occur.   Week 1: Patient will take Nerlynx $RemoveBefo'40mg'[id number]$  tablets, 3 tablets ($RemoveBe'120mg'UjrgBOqOa$ ) by mouth once daily with food.  Patient will be titrated up as tolerated and patient is aware.  240 mg once daily is the target dose of Nerlynx.  Patient knows to avoid grapefruit and grapefruit juice while on treatment with Nerlynx.  Patient instructed to avoid use of PPIs or H2RAs while on treatment with Nerlynx, can separate antacids from Nerlynx by 3 hours if acid suppression is needed.  Nerlynx start date: 08/17/2021  Adverse effects include but are not limited to: diarrhea, nausea, vomiting, mouth sores, fatigue, rash, and abdominal pain.    Patient instructed to increase or decrease loperamide dose to maintain 1-2 bowel movements per day.  Reviewed with patient importance of keeping a medication schedule and plan for any missed doses. No barriers to medication adherence identified.  Medication reconciliation performed and medication/allergy list updated.  Insurance authorization for Nerlynx has been obtained. Test claim at the pharmacy revealed copayment $10 for 1st fill of 90 tablets. Patient received medication from [place].   All questions answered.  Janet [name] voiced understanding and appreciation.   Medication education handout placed in mail for patient. Patient knows to call the office with questions or concerns.  Oral Chemotherapy Clinic phone number provided to patient.   Drema Halon, PharmD Hematology/Oncology Clinical Pharmacist [facility] [phone] 08/16/2021   10:03 AM

## 2021-08-22 ENCOUNTER — Telehealth: Payer: Self-pay | Admitting: *Deleted

## 2021-08-22 NOTE — Telephone Encounter (Signed)
Per MD request RN reviewed recent bone density with pt showing T score -1.7.  Per MD request, pt educated on OTC calcium supplement 1200 mg p.o daily, OTC Vitamin D 25 mcg daily as well as increasing weight bearing exercise.  Pt verbalized understanding.

## 2021-08-29 ENCOUNTER — Other Ambulatory Visit: Payer: Self-pay

## 2021-08-29 DIAGNOSIS — Z95828 Presence of other vascular implants and grafts: Secondary | ICD-10-CM

## 2021-08-29 NOTE — Progress Notes (Signed)
Labs placed for [facility] visit scheduled for 6/15.

## 2021-08-31 ENCOUNTER — Other Ambulatory Visit: Payer: Self-pay | Admitting: Lab

## 2021-08-31 ENCOUNTER — Inpatient Hospital Stay (HOSPITAL_BASED_OUTPATIENT_CLINIC_OR_DEPARTMENT_OTHER): Payer: 59 | Admitting: Physician Assistant

## 2021-08-31 ENCOUNTER — Inpatient Hospital Stay: Payer: 59 | Attending: Hematology and Oncology

## 2021-08-31 ENCOUNTER — Other Ambulatory Visit: Payer: Self-pay | Admitting: *Deleted

## 2021-08-31 ENCOUNTER — Other Ambulatory Visit: Payer: Self-pay

## 2021-08-31 ENCOUNTER — Inpatient Hospital Stay: Payer: 59 | Admitting: Pharmacist

## 2021-08-31 VITALS — BP 124/82 | HR 83 | Temp 97.8°F | Resp 17 | Wt 165.6 lb

## 2021-08-31 VITALS — BP 119/87 | HR 74 | Temp 97.5°F | Resp 15 | Ht 69.0 in | Wt 166.3 lb

## 2021-08-31 DIAGNOSIS — Z87891 Personal history of nicotine dependence: Secondary | ICD-10-CM | POA: Insufficient documentation

## 2021-08-31 DIAGNOSIS — Z809 Family history of malignant neoplasm, unspecified: Secondary | ICD-10-CM | POA: Diagnosis not present

## 2021-08-31 DIAGNOSIS — Z9221 Personal history of antineoplastic chemotherapy: Secondary | ICD-10-CM | POA: Diagnosis not present

## 2021-08-31 DIAGNOSIS — Z9012 Acquired absence of left breast and nipple: Secondary | ICD-10-CM | POA: Diagnosis not present

## 2021-08-31 DIAGNOSIS — Z9889 Other specified postprocedural states: Secondary | ICD-10-CM

## 2021-08-31 DIAGNOSIS — Z853 Personal history of malignant neoplasm of breast: Secondary | ICD-10-CM | POA: Insufficient documentation

## 2021-08-31 DIAGNOSIS — Z170 Estrogen receptor positive status [ER+]: Secondary | ICD-10-CM

## 2021-08-31 DIAGNOSIS — Z95828 Presence of other vascular implants and grafts: Secondary | ICD-10-CM

## 2021-08-31 DIAGNOSIS — Z923 Personal history of irradiation: Secondary | ICD-10-CM | POA: Diagnosis not present

## 2021-08-31 LAB — CMP (CANCER CENTER ONLY)
ALT: 22 U/L (ref 0–44)
AST: 26 U/L (ref 15–41)
Albumin: 4.2 g/dL (ref 3.5–5.0)
Alkaline Phosphatase: 91 U/L (ref 38–126)
Anion gap: 5 (ref 5–15)
BUN: 16 mg/dL (ref 6–20)
CO2: 30 mmol/L (ref 22–32)
Calcium: 10 mg/dL (ref 8.9–10.3)
Chloride: 105 mmol/L (ref 98–111)
Creatinine: 0.78 mg/dL (ref 0.44–1.00)
GFR, Estimated: >60 mL/min (ref >60)
Glucose, Bld: 93 mg/dL (ref 70–99)
Potassium: 3.9 mmol/L (ref 3.5–5.1)
Sodium: 140 mmol/L (ref 135–145)
Total Bilirubin: 0.6 mg/dL (ref 0.3–1.2)
Total Protein: 7.8 g/dL (ref 6.5–8.1)

## 2021-08-31 LAB — CBC WITH DIFFERENTIAL (CANCER CENTER ONLY)
Abs Immature Granulocytes: 0.02 10*3/uL (ref 0.00–0.07)
Basophils Absolute: 0 10*3/uL (ref 0.0–0.1)
Basophils Relative: 1 %
Eosinophils Absolute: 0.1 10*3/uL (ref 0.0–0.5)
Eosinophils Relative: 2 %
HCT: 39.7 % (ref 36.0–46.0)
Hemoglobin: 13.4 g/dL (ref 12.0–15.0)
Immature Granulocytes: 0 %
Lymphocytes Relative: 21 %
Lymphs Abs: 1 10*3/uL (ref 0.7–4.0)
MCH: 30.5 pg (ref 26.0–34.0)
MCHC: 33.8 g/dL (ref 30.0–36.0)
MCV: 90.2 fL (ref 80.0–100.0)
Monocytes Absolute: 0.5 10*3/uL (ref 0.1–1.0)
Monocytes Relative: 9 %
Neutro Abs: 3.3 10*3/uL (ref 1.7–7.7)
Neutrophils Relative %: 67 %
Platelet Count: 152 10*3/uL (ref 150–400)
RBC: 4.4 MIL/uL (ref 3.87–5.11)
RDW: 13.6 % (ref 11.5–15.5)
WBC Count: 5 10*3/uL (ref 4.0–10.5)
nRBC: 0 % (ref 0.0–0.2)

## 2021-08-31 NOTE — Progress Notes (Signed)
Symptom Management Consult note [place]    Patient Care Team: Charlynn Court, NP as PCP - General (Nurse Practitioner) Rockwell Germany, RN as Oncology Nurse Navigator Mauro Kaufmann, RN as Oncology Nurse Navigator    Name of the patient: Janet [name]  [id number]  09-13-1965   Date of visit: 08/31/2021    Chief complaint/ Reason for visit- port site check  Oncology History  Malignant neoplasm of lower-outer quadrant of left breast of female, estrogen receptor positive ([place])  06/23/2020 Initial Diagnosis   Normal mammogram September 2021.  December 2021: Following trauma to the breast she felt a lump but for variety of reasons work-up was postponed.  Mammogram revealed 2 tumors measuring 7 cm individually 4 cm and 3 cm, 3 axillary lymph nodes, biopsy revealed grade 3 IDC ER 10%, PR 0%, Ki-67 40%, HER-2 +3+ by IHC, lymph node also positive   06/28/2020 Cancer Staging   Staging form: Breast, AJCC 8th Edition - Clinical stage from 06/28/2020: Stage IIB (cT2, cN1, cM0, G3, ER+, PR-, HER2+) - Signed by Nicholas Lose, MD on 06/28/2020 Stage prefix: Initial diagnosis Histologic grading system: 3 grade system   07/13/2020 - 11/16/2020 Chemotherapy   [facility]   11/30/2020 Surgery   left mastectomy: Residual IDC 0.9 cm, focal high-grade DCIS, 5/12 lymph nodes are positive including 1 micromet, margins negative, ER 10%, PR 0%, HER2 3+, Ki-67 40%   12/15/2020 - 07/13/2021 Chemotherapy   Patient is on Treatment Plan : BREAST ADO-Trastuzumab Emtansine ([place]la) q21d     12/26/2020 Cancer Staging   Staging form: Breast, AJCC 8th Edition - Pathologic stage from 12/26/2020: No Stage Recommended (ypT1c, pN2a, cM0, G3, ER+, PR-, HER2+) - Signed by Eppie Gibson, MD on 12/26/2020 Stage prefix: Post-therapy Histologic grading system: 3 grade system     Current Therapy:  Letrozole  Interval history- Janet [name] is a 56 y.o. with oncologic history as above presenting [facility] today with chief complaint of needing her port site checked. Patient had port removed on 07/14/21 by general surgery team.  Patient reports she noticed a pea-sized lump below incision 4 days ago after her shower.  She states the the port incision has overall healed well.  She denies any tenderness to the area.  There has not been any bleeding or pus draining from incision site.  She has not needed to take any over-the-counter medications for symptoms prior to arrival.  She denies any fever or chills.      ROS  All other systems are reviewed and are negative for acute change except as noted in the HPI.    No Known Allergies   Past Medical History:  Diagnosis Date   Anemia    Fibrocystic breast disease    Heart murmur    Mitral regurgitation    Mild by echo 09/17/11.   Neuromuscular disorder ([place])    mild neuropathy   Varicose vein of leg      Past Surgical History:  Procedure Laterality Date   BREAST BIOPSY     BREAST BIOPSY     BREAST LUMPECTOMY     Fibroadenoma of the left breast   DILATATION & CURETTAGE/HYSTEROSCOPY WITH MYOSURE N/A 01/26/2016   Procedure: DILATATION & [place] WITH MYOSURE;  Surgeon: Allyn Kenner, DO;  Location: [facility];  Service: Gynecology;  Laterality: N/A;   HYSTEROSCOPY WITH NOVASURE N/A 01/26/2016   Procedure: HYSTEROSCOPY WITH NOVASURE;  Surgeon: Allyn Kenner, DO;  Location: [facility];  Service: Gynecology;  Laterality: N/A;   PORTACATH PLACEMENT Right 07/12/2020   Procedure: INSERTION PORT-A-CATH RIGHT INTERNAL JUGULAR;  Surgeon: Rolm Bookbinder, MD;  Location: [place];  Service: General;  Laterality: Right;   RADIOACTIVE SEED GUIDED AXILLARY SENTINEL LYMPH NODE Left 11/30/2020   Procedure: LEFT AXILLARY NODE SEED GUIDED EXCISION;  Surgeon: Rolm Bookbinder, MD;  Location: [place];  Service: General;  Laterality: Left;   SIMPLE MASTECTOMY WITH AXILLARY SENTINEL NODE BIOPSY Left 11/30/2020   Procedure: LEFT SIMPLE MASTECTOMY WITH AXILLARY  SENTINEL NODE BIOPSY;  Surgeon: Rolm Bookbinder, MD;  Location: [place];  Service: General;  Laterality: Left;   [place]     with lasor R-leg   WISDOM TOOTH EXTRACTION      Social History   Socioeconomic History   Marital status: Married    Spouse name: Not on file   Number of children: 2   Years of education: Not on file   Highest education level: Not on file  Occupational History   Occupation: Biochemist, clinical  Tobacco Use   Smoking status: Former    Packs/day: 1.00    Years: 20.00    Total pack years: 20.00    Types: Cigarettes    Quit date: 03/19/2002    Years since quitting: 19.4   Smokeless tobacco: Never  Vaping Use   Vaping Use: Never used  Substance and Sexual Activity   Alcohol use: No   Drug use: No   Sexual activity: Yes    Birth control/protection: None  Other Topics Concern   Not on file  Social History Narrative   Not on file   Social Determinants of Health   Financial Resource Strain: Not on file  Food Insecurity: Not on file  Transportation Needs: Not on file  Physical Activity: Not on file  Stress: Not on file  Social Connections: Not on file  Intimate Partner Violence: Not on file    Family History  Problem Relation Age of Onset   Cancer Father        Lung or esphageal   Hypertension Mother    Diabetes Mother    Coronary artery disease Paternal Uncle      Current Outpatient Medications:    Calcium Carb-Cholecalciferol (CALCIUM 500+D3) 500-10 MG-MCG TABS, Take 1 tablet by mouth daily in the afternoon., Disp: 90 tablet, Rfl: 3   letrozole (FEMARA) 2.5 MG tablet, Take 1 tablet (2.5 mg total) by mouth daily., Disp: 90 tablet, Rfl: 3   Neratinib Maleate (NERLYNX) 40 MG tablet, Take 3 tablets (120 mg total) by mouth daily. Take with food., Disp: 90 tablet, Rfl: 0  PHYSICAL EXAM: ECOG FS:1 - Symptomatic but completely ambulatory    Vitals:   08/31/21 1105  BP: 124/82  Pulse: 83  Resp: 17  Temp: 97.8 F (36.6 C)   TempSrc: Oral  SpO2: 100%  Weight: 165 lb 9 oz (75.1 kg)   Physical Exam Vitals and nursing note reviewed.  Constitutional:      Appearance: She is well-developed. She is not ill-appearing or toxic-appearing.  HENT:     Head: Normocephalic and atraumatic.     Nose: Nose normal.  Eyes:     General: No scleral icterus.       Right eye: No discharge.        Left eye: No discharge.     Conjunctiva/sclera: Conjunctivae normal.  Neck:     Vascular: No JVD.  Cardiovascular:     Rate and Rhythm: Normal rate and regular  rhythm.     Pulses: Normal pulses.     Heart sounds: Normal heart sounds.  Pulmonary:     Effort: Pulmonary effort is normal.     Breath sounds: Normal breath sounds.  Chest:     Comments: Incision overlies right upper chest. Incision leaning well. No wound dehiscence. No overlying erythema. Non tender. Approximately 0.5 cm x 0.5 cm circular palpable mass directly below incision. No fluctuance.  Abdominal:     General: There is no distension.  Musculoskeletal:        General: Normal range of motion.     Cervical back: Normal range of motion.  Skin:    General: Skin is warm and dry.  Neurological:     Mental Status: She is oriented to person, place, and time.     GCS: GCS eye subscore is 4. GCS verbal subscore is 5. GCS motor subscore is 6.     Comments: Fluent speech, no facial droop.  Psychiatric:        Behavior: Behavior normal.        LABORATORY DATA: I have reviewed the data as listed    Latest Ref Rng & Units 08/31/2021    9:08 AM 07/13/2021    8:53 AM 06/22/2021    9:05 AM  CBC  WBC 4.0 - 10.5 K/uL 5.0  4.3  4.5   Hemoglobin 12.0 - 15.0 g/dL 13.4  12.5  13.0   Hematocrit 36.0 - 46.0 % 39.7  36.9  38.4   Platelets 150 - 400 K/uL 152  163  155         Latest Ref Rng & Units 08/31/2021    9:08 AM 07/13/2021    8:53 AM 06/22/2021    9:05 AM  CMP  Glucose 70 - 99 mg/dL 93  94  98   BUN 6 - 20 mg/dL $Remove'16  13  12   'mpfwqYV$ Creatinine 0.44 - 1.00 mg/dL 0.78   0.68  0.66   Sodium 135 - 145 mmol/L 140  140  140   Potassium 3.5 - 5.1 mmol/L 3.9  3.8  3.8   Chloride 98 - 111 mmol/L 105  107  105   CO2 22 - 32 mmol/L $RemoveB'30  29  29   'VWRbzrMg$ Calcium 8.9 - 10.3 mg/dL 10.0  9.8  9.7   Total Protein 6.5 - 8.1 g/dL 7.8  7.6  8.1   Total Bilirubin 0.3 - 1.2 mg/dL 0.6  0.5  0.6   Alkaline Phos 38 - 126 U/L 91  89  92   AST 15 - 41 U/L $Remo'26  30  28   'geEQg$ ALT 0 - 44 U/L $Remo'22  21  21        'EnKoC$ RADIOGRAPHIC STUDIES: I have personally reviewed the radiological images as listed and agreed with the findings in the report. No images are attached to the encounter. No results found.   ASSESSMENT & PLAN: Patient is a 56 y.o. female  with oncologic history of malignant neoplasm of lower-outer quadrant of left breast, ER + followed by Dr. Lindi Adie.  I have viewed most recent oncology note and lab work.   #)Port site check-patient is very well-appearing.  Exam consistent with likely scar tissue after port removal.  No exam findings to suggest abscess or infection.  Discussed with patient that this can take up to 3 months to heal after port removal.  Encouraged her to monitor the area to watch for developing infection or abscess.  Patient knows to  call the cancer center or the surgeons office if there are any concerning findings in the future.  Visit Diagnosis: 1. History of removal of Port-a-Cath      No orders of the defined types were placed in this encounter.   All questions were answered. The patient knows to call the clinic with any problems, questions or concerns. No barriers to learning was detected.  I have spent a total of 10 minutes minutes of face-to-face and non-face-to-face time, preparing to see the patient, obtaining and/or reviewing separately obtained history, performing a medically appropriate examination, counseling and educating the patient, documenting clinical information in the electronic health record, and care coordination.     Thank you for allowing me to  participate in the care of this patient.    Barrie Folk, PA-C Department of Hematology/Oncology [facility] Phone: [phone]  [phone]    08/31/2021 2:34 PM

## 2021-08-31 NOTE — Progress Notes (Signed)
[place]       Telephone: [phone]?Fax: [phone]   Oncology Clinical Pharmacist Practitioner Initial Assessment  Janet [name] is a 56 y.o. female with a diagnosis of breast cancer. They were contacted today via in person visit.  Indication/Regimen Neratinib (Nerlynx) is being used appropriately for treatment of breast cancer in the extended adjuvant setting by Dr. Nicholas Lose.      Wt Readings from Last 1 Encounters:  08/31/21 165 lb 9 oz (75.1 kg)    Estimated body surface area is 1.92 meters squared as calculated from the following:   Height as of this encounter: '5\' 9"'$  (1.753 m).   Weight as of this encounter: 166 lb 4.8 oz (75.4 kg).  The dosing regimen is 3 tablets (120 mg) by mouth daily on days 1 to 28 of a 28-day cycle. This is being given letrozole. It is planned to continue until 1 year of therapy in the extended adjuvant setting.  [name] was seen today by clinical pharmacy after being referred by Dr. Lindi Adie to establish care for her neratinib management.  She states that she started this medication on 08/17/21.  She started letrozole on 03/30/21.  She last saw Dr. Lindi Adie on 06/22/21.  Today we went over possible side effects of neratinib which include but are not limited to diarrhea, liver toxicity, nausea, vomiting, and fatigue.  We also discussed storage and handling of neratinib and what to do if she would miss a dose.  [name] states that she takes her neratinib around 8 to 8:30 AM every morning.  She also does take a calcium/vitamin D supplement and she knows to space this out from the neratinib by at least 3 hours per the manufacturing guidelines.  So far she is not reporting any side effects from the neratinib at this time.  She does have loperamide at home to be taken as needed if she does experience diarrhea and we went over the instructions for the loperamide at today's visit.  She does also have prochlorperazine at home should she have  any nausea and she will let us know if she would like [place] to send ondansetron to her pharmacy of choice at a later time.  We also went over to drink plenty of fluids and to monitor her urine output.  With regards to her letrozole, she did state that she had a bone density scan done with [facility] on 08/21/21 that showed osteopenia.  We did discuss that we will continue to do a bone density scan every 2 years as letrozole is known to decrease bone density.  We reviewed with Ms. Phylliss Bob today that manufacturing guidelines recommend monthly labs for the first 3 months and then if neratinib is well-tolerated those labs can be spaced out to every 3 months.  Since she is doing well on the 3 tablets daily, we will discuss increasing to 4 tablets daily at her next visit.  She has been on neratinib approximately 2 weeks.  She uses [facility] and is having no issues with obtaining medication from them.  Dose Modifications Dr. Lindi Adie has started neratinib at 3 tablets 120 mg) daily with food.  We will titrate the dose accordingly and as tolerated.  Access Assessment FLORDIA [name] will be receiving neratinib through [facility] Concerns: None Start date if known: 08/17/21  Allergies No Known Allergies  Vitals    08/31/2021   11:05 AM 08/31/2021    9:58 AM 07/13/2021  9:16 AM  Vitals with BMI  Height  '5\' 9"'$    Weight 165 lbs 9 oz 166 lbs 5 oz 165 lbs  BMI 24.44 74.12 87.86  Systolic 767 209 470  Diastolic 82 87 81  Pulse 83 74 78     Laboratory Data    Latest Ref Rng & Units 08/31/2021    9:08 AM 07/13/2021    8:53 AM 06/22/2021    9:05 AM  CBC EXTENDED  WBC 4.0 - 10.5 K/uL 5.0  4.3  4.5   RBC 3.87 - 5.11 MIL/uL 4.40  4.17  4.38   Hemoglobin 12.0 - 15.0 g/dL 13.4  12.5  13.0   HCT 36.0 - 46.0 % 39.7  36.9  38.4   Platelets 150 - 400 K/uL 152  163  155   NEUT# 1.7 - 7.7 K/uL 3.3  2.3  2.4   Lymph# 0.7 - 4.0 K/uL 1.0  1.3  1.3        Latest Ref Rng & Units  08/31/2021    9:08 AM 07/13/2021    8:53 AM 06/22/2021    9:05 AM  CMP  Glucose 70 - 99 mg/dL 93  94  98   BUN 6 - 20 mg/dL '16  13  12   '$ Creatinine 0.44 - 1.00 mg/dL 0.78  0.68  0.66   Sodium 135 - 145 mmol/L 140  140  140   Potassium 3.5 - 5.1 mmol/L 3.9  3.8  3.8   Chloride 98 - 111 mmol/L 105  107  105   CO2 22 - 32 mmol/L '30  29  29   '$ Calcium 8.9 - 10.3 mg/dL 10.0  9.8  9.7   Total Protein 6.5 - 8.1 g/dL 7.8  7.6  8.1   Total Bilirubin 0.3 - 1.2 mg/dL 0.6  0.5  0.6   Alkaline Phos 38 - 126 U/L 91  89  92   AST 15 - 41 U/L '26  30  28   '$ ALT 0 - 44 U/L '22  21  21    '$ No results found for: "MG"   Contraindications Contraindications were reviewed?  Yes Contraindications to therapy were identified?  No  Safety Precautions The following safety precautions for the use of neratinib were reviewed:  Diarrhea: has loperamide on hand and reviewed as needed instructions Liver toxicity: reviewed possible side effects such as RUQ pain, yellowing of skin/eyes Drug interactions: PPIs (avoid), H2RAs, antacids discussed how to space these agents out from neratinib Nausea / Vomiting: has prochlorperazine on hand  Medication Reconciliation Current Outpatient Medications  Medication Sig Dispense Refill   Calcium Carb-Cholecalciferol (CALCIUM 500+D3) 500-10 MG-MCG TABS Take 1 tablet by mouth daily in the afternoon. 90 tablet 3   letrozole (FEMARA) 2.5 MG tablet Take 1 tablet (2.5 mg total) by mouth daily. 90 tablet 3   Neratinib Maleate (NERLYNX) 40 MG tablet Take 3 tablets (120 mg total) by mouth daily. Take with food. 90 tablet 0   No current facility-administered medications for this visit.    Medication reconciliation is based on the patient's most recent medication list in the electronic medical record (EMR) including herbal products and OTC medications.   The patient's medication list was reviewed today with the patient?  Yes  Drug-drug interactions (DDIs) DDIs were evaluated?   Yes Significant DDIs identified?  No, as above, reviewed to space out calcium with neratinib by at least 3 hours per manufacturing guidelines  Drug-Food Interactions Drug-food interactions were evaluated?  Yes  Significant Drug-food interactions identified?  Yes, avoid grapefruit products  Follow-up Plan  Continue neratinib 3 tablets daily with food.  We will discuss dose increase at next visit in approximately 4 weeks. Continue letrozole daily.  Monitor joint/knee pain with this agent.  Has been taking loratadine as needed and she could consider over-the-counter pain meds. Labs, pharmacy clinic visit, in 4 weeks, 8 weeks, and Dr. Lindi Adie in 12 weeks.  We will titrate dose of neratinib as tolerated per Dr. Geralyn Flash request.  Madilyn Fireman participated in the discussion, expressed understanding, and voiced agreement with the above plan. All questions were answered to her satisfaction. The patient was advised to contact the clinic at [phone] with any questions or concerns prior to her return visit.   I spent 60 minutes assessing the patient.  Raina Mina, RPH-CPP, 08/31/2021 11:06 AM  **Disclaimer: This note was dictated with voice recognition software. Similar sounding words can inadvertently be transcribed and this note may contain transcription errors which may not have been corrected upon publication of note.**

## 2021-09-05 ENCOUNTER — Other Ambulatory Visit: Payer: Self-pay | Admitting: Hematology and Oncology

## 2021-09-05 DIAGNOSIS — Z170 Estrogen receptor positive status [ER+]: Secondary | ICD-10-CM

## 2021-09-05 NOTE — Telephone Encounter (Signed)
John can you please review and refill. Thanks.

## 2021-09-06 ENCOUNTER — Other Ambulatory Visit: Payer: Self-pay | Admitting: Pharmacist

## 2021-09-06 ENCOUNTER — Encounter: Payer: Self-pay | Admitting: Hematology and Oncology

## 2021-09-06 DIAGNOSIS — Z170 Estrogen receptor positive status [ER+]: Secondary | ICD-10-CM

## 2021-09-06 MED ORDER — NERATINIB MALEATE 40 MG PO TABS: 120.0000 mg | ORAL_TABLET | Freq: Every day | ORAL | 0 refills | Status: DC

## 2021-09-27 ENCOUNTER — Other Ambulatory Visit: Payer: Self-pay | Admitting: *Deleted

## 2021-09-27 DIAGNOSIS — C50512 Malignant neoplasm of lower-outer quadrant of left female breast: Secondary | ICD-10-CM

## 2021-09-28 ENCOUNTER — Other Ambulatory Visit: Payer: Self-pay

## 2021-09-28 ENCOUNTER — Inpatient Hospital Stay: Payer: 59 | Attending: Hematology and Oncology

## 2021-09-28 ENCOUNTER — Inpatient Hospital Stay: Payer: 59 | Admitting: Pharmacist

## 2021-09-28 ENCOUNTER — Inpatient Hospital Stay: Payer: 59

## 2021-09-28 DIAGNOSIS — C50512 Malignant neoplasm of lower-outer quadrant of left female breast: Secondary | ICD-10-CM | POA: Diagnosis not present

## 2021-09-28 LAB — CBC WITH DIFFERENTIAL (CANCER CENTER ONLY)
Abs Immature Granulocytes: 0.02 10*3/uL (ref 0.00–0.07)
Basophils Absolute: 0 10*3/uL (ref 0.0–0.1)
Basophils Relative: 1 %
Eosinophils Absolute: 0.2 10*3/uL (ref 0.0–0.5)
Eosinophils Relative: 4 %
HCT: 41 % (ref 36.0–46.0)
Hemoglobin: 14.2 g/dL (ref 12.0–15.0)
Immature Granulocytes: 0 %
Lymphocytes Relative: 26 %
Lymphs Abs: 1.3 10*3/uL (ref 0.7–4.0)
MCH: 30.8 pg (ref 26.0–34.0)
MCHC: 34.6 g/dL (ref 30.0–36.0)
MCV: 88.9 fL (ref 80.0–100.0)
Monocytes Absolute: 0.5 10*3/uL (ref 0.1–1.0)
Monocytes Relative: 10 %
Neutro Abs: 2.8 10*3/uL (ref 1.7–7.7)
Neutrophils Relative %: 59 %
Platelet Count: 158 10*3/uL (ref 150–400)
RBC: 4.61 MIL/uL (ref 3.87–5.11)
RDW: 13.6 % (ref 11.5–15.5)
WBC Count: 4.8 10*3/uL (ref 4.0–10.5)
nRBC: 0 % (ref 0.0–0.2)

## 2021-09-28 LAB — CMP (CANCER CENTER ONLY)
ALT: 23 U/L (ref 0–44)
AST: 26 U/L (ref 15–41)
Albumin: 4.5 g/dL (ref 3.5–5.0)
Alkaline Phosphatase: 95 U/L (ref 38–126)
Anion gap: 5 (ref 5–15)
BUN: 16 mg/dL (ref 6–20)
CO2: 30 mmol/L (ref 22–32)
Calcium: 10.2 mg/dL (ref 8.9–10.3)
Chloride: 104 mmol/L (ref 98–111)
Creatinine: 0.85 mg/dL (ref 0.44–1.00)
GFR, Estimated: >60 mL/min (ref >60)
Glucose, Bld: 84 mg/dL (ref 70–99)
Potassium: 4.1 mmol/L (ref 3.5–5.1)
Sodium: 139 mmol/L (ref 135–145)
Total Bilirubin: 0.7 mg/dL (ref 0.3–1.2)
Total Protein: 8.1 g/dL (ref 6.5–8.1)

## 2021-09-28 MED ORDER — NERATINIB MALEATE 40 MG PO TABS: 160.0000 mg | ORAL_TABLET | Freq: Every day | ORAL | 0 refills | Status: DC

## 2021-09-28 NOTE — Progress Notes (Signed)
[place]       Telephone: [phone]?Fax: [phone]   Oncology Clinical Pharmacist Practitioner Progress Note  Janet [name] was contacted via in-person visit to discuss her chemotherapy regimen for neratinib which they receive under the care of Dr. Nicholas Lose.   Current treatment regimen and start date Neratinib (08/17/21) Letrozole (03/30/21)  Interval History She continues on neratinib 3 tablets (120 mg) by mouth daily on days 1 to 28 of a 28-day cycle. This is being given in combination with letrozole 2.5 mg by mouth daily. Therapy is planned to continue until 1 year of neratinib therapy in the extended adjuvant setting.   Response to Therapy Janet [name] was seen today by clinical pharmacy as a follow-up to her neratinib management.  She was last seen by clinical pharmacy on 08/31/21 and Dr. Lindi Adie on 06/22/21.  Janet [name] is doing extremely well.  She is tolerating neratinib and is interested in increasing her dose to 4 tablets daily starting tomorrow.  We have sent a new prescription to [facility] with this dose increase.  She has not needed to take any loperamide as of yet and has not needed to take any antinausea meds as of yet.  She does have both agents on hand should she need them.  She will be on vacation when she has neck scheduled to see clinical pharmacy on 10/26/21 and so we will move these appointments up 1 week to 11/02/21.  She will next see Dr. Lindi Adie with labs on 11/30/21.  We will discuss increasing the dose if tolerated at both of these appointments.  After her September visit, she can consider getting labs and visits every 3 months per manufacturing guidelines.  She did state today that Dr. Lindi Adie had discussed doing the [place] blood test with her in April.  We will send our visit notes today to him to see if he is still considering ordering this.. Labs, vitals, treatment parameters, and manufacturer guidelines assessing toxicity were  reviewed with Janet [name] today. Based on these values, patient is in agreement to continue neratinib therapy at this time.  Allergies No Known Allergies  Vitals    09/28/2021   10:13 AM 08/31/2021   11:05 AM 08/31/2021    9:58 AM  Vitals with BMI  Height   '5\' 9"'$   Weight 166 lbs 8 oz 165 lbs 9 oz 166 lbs 5 oz  BMI 24.58 73.71 06.26  Systolic 948 546 270  Diastolic 82 82 87  Pulse 77 83 74     Laboratory Data    Latest Ref Rng & Units 09/28/2021   10:03 AM 08/31/2021    9:08 AM 07/13/2021    8:53 AM  CBC EXTENDED  WBC 4.0 - 10.5 K/uL 4.8  5.0  4.3   RBC 3.87 - 5.11 MIL/uL 4.61  4.40  4.17   Hemoglobin 12.0 - 15.0 g/dL 14.2  13.4  12.5   HCT 36.0 - 46.0 % 41.0  39.7  36.9   Platelets 150 - 400 K/uL 158  152  163   NEUT# 1.7 - 7.7 K/uL 2.8  3.3  2.3   Lymph# 0.7 - 4.0 K/uL 1.3  1.0  1.3        Latest Ref Rng & Units 09/28/2021   10:03 AM 08/31/2021    9:08 AM 07/13/2021    8:53 AM  CMP  Glucose 70 - 99 mg/dL 84  93  94   BUN 6 -  20 mg/dL '16  16  13   '$ Creatinine 0.44 - 1.00 mg/dL 0.85  0.78  0.68   Sodium 135 - 145 mmol/L 139  140  140   Potassium 3.5 - 5.1 mmol/L 4.1  3.9  3.8   Chloride 98 - 111 mmol/L 104  105  107   CO2 22 - 32 mmol/L '30  30  29   '$ Calcium 8.9 - 10.3 mg/dL 10.2  10.0  9.8   Total Protein 6.5 - 8.1 g/dL 8.1  7.8  7.6   Total Bilirubin 0.3 - 1.2 mg/dL 0.7  0.6  0.5   Alkaline Phos 38 - 126 U/L 95  91  89   AST 15 - 41 U/L '26  26  30   '$ ALT 0 - 44 U/L '23  22  21     '$ No results found for: "MG"   Adverse Effects Assessment Not experiencing any side effects at this time  Adherence Assessment Janet [name] reports missing 0 doses over the past 4 weeks.   Reason for missed dose: N/A Patient was re-educated on importance of adherence.   Access Assessment Janet [name] is currently receiving her neratinib through [facility] concerns: None  Medication Reconciliation The patient's medication list was reviewed today with  the patient?  Yes New medications or herbal supplements have recently been started?  No, did add loratadine which she has been taking for joint pain as she feels it may help.  She originally started taking and when she was taken pegfilgrastim Any medications have been discontinued?  No The medication list was updated and reconciled based on the patient's most recent medication list in the electronic medical record (EMR) including herbal products and OTC medications.   Medications Current Outpatient Medications  Medication Sig Dispense Refill   Calcium Carb-Cholecalciferol (CALCIUM 500+D3) 500-10 MG-MCG TABS Take 1 tablet by mouth daily in the afternoon. 90 tablet 3   letrozole (FEMARA) 2.5 MG tablet Take 1 tablet (2.5 mg total) by mouth daily. 90 tablet 3   Loratadine 10 MG CAPS Take 10 mg by mouth daily.     Neratinib Maleate (NERLYNX) 40 MG tablet Take 4 tablets (160 mg total) by mouth daily. Take with food. 120 tablet 0   No current facility-administered medications for this visit.    Drug-Drug Interactions (DDIs) DDIs were evaluated?  Yes Significant DDIs?  Yes, she continues to separate calcium and her neratinib by at least 3 hours per manufacturing guidelines The patient was instructed to speak with their health care provider and/or the oral chemotherapy pharmacist before starting any new drug, including prescription or over the counter, natural / herbal products, or vitamins.  Supportive Care Diarrhea: She knows to take loperamide if needed  Dosing Assessment Hepatic adjustments needed?  No Renal adjustments needed?  No Toxicity adjustments needed?  No The current dosing regimen is appropriate to continue at this time.  As above we will increase neratinib to 4 tablets daily with food starting tomorrow morning.  Follow-Up Plan Increase neratinib to 4 tablets (160 mg) by mouth daily with food.  She will start this new dose tomorrow.  A new prescription has been sent to [place]. Continue letrozole 2.5 mg by mouth daily Monitor for increased side effects with dose increase of neratinib We will send Dr. Lindi Adie visit note from today inquiring about Signatera test that he mentioned and patient agreed upon to do at his visit in April. Move labs, pharmacy  clinic visit, from 10/26/21 to 11/02/21 Move labs, Dr. Lindi Adie visit, from 11/23/21 to 11/30/21.  After this visit she can consider having labs and visits every 3 months  Janet [name] participated in the discussion, expressed understanding, and voiced agreement with the above plan. All questions were answered to her satisfaction. The patient was advised to contact the clinic at [phone] with any questions or concerns prior to her return visit.   I spent 30 minutes assessing and educating the patient.  Raina Mina, RPH-CPP, 09/28/2021  10:45 AM   **Disclaimer: This note was dictated with voice recognition software. Similar sounding words can inadvertently be transcribed and this note may contain transcription errors which may not have been corrected upon publication of note.**

## 2021-10-21 ENCOUNTER — Other Ambulatory Visit: Payer: Self-pay | Admitting: Hematology and Oncology

## 2021-10-21 DIAGNOSIS — Z170 Estrogen receptor positive status [ER+]: Secondary | ICD-10-CM

## 2021-10-24 ENCOUNTER — Encounter: Payer: Self-pay | Admitting: Hematology and Oncology

## 2021-10-26 ENCOUNTER — Telehealth: Payer: Self-pay | Admitting: Pharmacist

## 2021-10-26 ENCOUNTER — Inpatient Hospital Stay: Payer: 59

## 2021-10-26 ENCOUNTER — Inpatient Hospital Stay: Payer: 59 | Admitting: Pharmacist

## 2021-10-26 NOTE — Telephone Encounter (Signed)
Scheduled per 8/10 in basket, message has been left

## 2021-11-02 ENCOUNTER — Inpatient Hospital Stay: Payer: 59 | Admitting: Pharmacist

## 2021-11-02 ENCOUNTER — Inpatient Hospital Stay: Payer: 59

## 2021-11-07 ENCOUNTER — Inpatient Hospital Stay: Payer: 59 | Admitting: Pharmacist

## 2021-11-07 ENCOUNTER — Inpatient Hospital Stay: Payer: 59 | Attending: Hematology and Oncology

## 2021-11-07 ENCOUNTER — Other Ambulatory Visit: Payer: Self-pay

## 2021-11-07 DIAGNOSIS — Z79810 Long term (current) use of selective estrogen receptor modulators (SERMs): Secondary | ICD-10-CM | POA: Insufficient documentation

## 2021-11-07 DIAGNOSIS — Z79899 Other long term (current) drug therapy: Secondary | ICD-10-CM | POA: Insufficient documentation

## 2021-11-07 DIAGNOSIS — Z170 Estrogen receptor positive status [ER+]: Secondary | ICD-10-CM | POA: Diagnosis not present

## 2021-11-07 DIAGNOSIS — C50512 Malignant neoplasm of lower-outer quadrant of left female breast: Secondary | ICD-10-CM | POA: Diagnosis present

## 2021-11-07 LAB — CBC WITH DIFFERENTIAL (CANCER CENTER ONLY)
Abs Immature Granulocytes: 0.02 10*3/uL (ref 0.00–0.07)
Basophils Absolute: 0 10*3/uL (ref 0.0–0.1)
Basophils Relative: 1 %
Eosinophils Absolute: 0.1 10*3/uL (ref 0.0–0.5)
Eosinophils Relative: 3 %
HCT: 37.4 % (ref 36.0–46.0)
Hemoglobin: 13.3 g/dL (ref 12.0–15.0)
Immature Granulocytes: 1 %
Lymphocytes Relative: 28 %
Lymphs Abs: 1.2 10*3/uL (ref 0.7–4.0)
MCH: 31.8 pg (ref 26.0–34.0)
MCHC: 35.6 g/dL (ref 30.0–36.0)
MCV: 89.5 fL (ref 80.0–100.0)
Monocytes Absolute: 0.5 10*3/uL (ref 0.1–1.0)
Monocytes Relative: 10 %
Neutro Abs: 2.6 10*3/uL (ref 1.7–7.7)
Neutrophils Relative %: 57 %
Platelet Count: 170 10*3/uL (ref 150–400)
RBC: 4.18 MIL/uL (ref 3.87–5.11)
RDW: 13.4 % (ref 11.5–15.5)
WBC Count: 4.4 10*3/uL (ref 4.0–10.5)
nRBC: 0 % (ref 0.0–0.2)

## 2021-11-07 LAB — CMP (CANCER CENTER ONLY)
ALT: 27 U/L (ref 0–44)
AST: 27 U/L (ref 15–41)
Albumin: 4.4 g/dL (ref 3.5–5.0)
Alkaline Phosphatase: 91 U/L (ref 38–126)
Anion gap: 3 — ABNORMAL LOW (ref 5–15)
BUN: 16 mg/dL (ref 6–20)
CO2: 32 mmol/L (ref 22–32)
Calcium: 9.8 mg/dL (ref 8.9–10.3)
Chloride: 106 mmol/L (ref 98–111)
Creatinine: 0.83 mg/dL (ref 0.44–1.00)
GFR, Estimated: >60 mL/min (ref >60)
Glucose, Bld: 90 mg/dL (ref 70–99)
Potassium: 4.1 mmol/L (ref 3.5–5.1)
Sodium: 141 mmol/L (ref 135–145)
Total Bilirubin: 0.6 mg/dL (ref 0.3–1.2)
Total Protein: 7.3 g/dL (ref 6.5–8.1)

## 2021-11-07 MED ORDER — NERATINIB MALEATE 40 MG PO TABS: 200.0000 mg | ORAL_TABLET | Freq: Every day | ORAL | 1 refills | Status: DC

## 2021-11-07 NOTE — Progress Notes (Signed)
[place]       Telephone: [phone]?Fax: [phone]   Oncology Clinical Pharmacist Practitioner Progress Note  Janet [name] was contacted via in-person visit to discuss her chemotherapy regimen for neratinib which they receive under the care of Dr. Nicholas [name].    Current treatment regimen and start date Neratinib (08/17/21) Letrozole (03/30/21)   Interval History She continues on neratinib 4 tablets (160 mg) by mouth daily on days 1 to 28 of a 28-day cycle. This is being given in combination with letrozole 2.5 mg by mouth daily. Therapy is planned to continue until 1 year of neratinib therapy in the extended adjuvant setting.   Response to Therapy Janet [name] was seen by clinical pharmacy today as a follow up to her neratinib management. She last saw clinical pharmacy on 09/28/21 and Dr. Lindi [name] on 06/22/21. She is doing well on neratinib 4 tablets daily and will increase to 5 tablets starting tomorrow morning. A [facility] prescription for this dose has been [facility] to [facility].   She is not experiencing any side effects from neratinib at this time and continues to take letrozole daily with some mild joint pain. She did not notice any difference when stopping loratadine. We did discuss she could consider an over the counter NSAID such as naproxen or ibuprofen for these symptoms. She next has a follow up appointment with labs with Dr. Lindi [name] scheduled for  11/30/21. At that time, he may order her annual mammogram and she did inquire about any sort of tests for her mastectomy breast site.  We did discuss signateera test will be after her neratinib is finished per Dr. Lindi [name] recommendations.She continues on calcium/vitd and knows to separate this out from her neratinib dose.  She did inquire about her annual dermatology appointment and we recommended she could go ahead and schedule. She was also going to inquire with her GI physician about potentially another  colonoscopy and if needed, we discussed the neratinib should not affect this being done. Labs, vitals, treatment parameters, and manufacturer guidelines assessing toxicity were reviewed with Janet [name] today. Based on these values, patient is in agreement to continue neratinib therapy at this time.  Allergies No Known Allergies  Vitals    11/07/2021   10:02 AM 09/28/2021   10:13 AM 08/31/2021   11:05 AM  Vitals with BMI  Height '5\' 9"'$     Weight 171 lbs 2 oz 166 lbs 8 oz 165 lbs 9 oz  BMI 25.26 93.26 71.24  Systolic 580 998 338  Diastolic 79 82 82  Pulse 77 77 83     Laboratory Data    Latest Ref Rng & Units 11/07/2021    9:45 AM 09/28/2021   10:03 AM 08/31/2021    9:08 AM  CBC EXTENDED  WBC 4.0 - 10.5 K/uL 4.4  4.8  5.0   RBC 3.87 - 5.11 MIL/uL 4.18  4.61  4.40   Hemoglobin 12.0 - 15.0 g/dL 13.3  14.2  13.4   HCT 36.0 - 46.0 % 37.4  41.0  39.7   Platelets 150 - 400 K/uL 170  158  152   NEUT# 1.7 - 7.7 K/uL 2.6  2.8  3.3   Lymph# 0.7 - 4.0 K/uL 1.2  1.3  1.0        Latest Ref Rng & Units 11/07/2021    9:45 AM 09/28/2021   10:03 AM 08/31/2021    9:08 AM  CMP  Glucose 70 - 99  mg/dL 90  84  93   BUN 6 - 20 mg/dL '16  16  16   '$ Creatinine 0.44 - 1.00 mg/dL 0.83  0.85  0.78   Sodium 135 - 145 mmol/L 141  139  140   Potassium 3.5 - 5.1 mmol/L 4.1  4.1  3.9   Chloride 98 - 111 mmol/L 106  104  105   CO2 22 - 32 mmol/L 32  30  30   Calcium 8.9 - 10.3 mg/dL 9.8  10.2  10.0   Total Protein 6.5 - 8.1 g/dL 7.3  8.1  7.8   Total Bilirubin 0.3 - 1.2 mg/dL 0.6  0.7  0.6   Alkaline Phos 38 - 126 U/L 91  95  91   AST 15 - 41 U/L '27  26  26   '$ ALT 0 - 44 U/L '27  23  22     '$ No results found for: "MG"   Adverse Effects Assessment No side effects attributed to neratinib at this time  Adherence Assessment Janet [name] reports missing 1 doses over the past 4 weeks.   Reason for missed dose: on vacation in [place] and forgot Patient was re-educated on importance of adherence.    Access Assessment Janet [name] is currently receiving her neratinib through  [employer] concerns:  none  Medication Reconciliation The patient's medication list was reviewed today with the patient? Yes New medications or herbal supplements have recently been started? No  Any medications have been discontinued? Yes , stopped loratadine The medication list was updated and reconciled based on the patient's most recent medication list in the electronic medical record (EMR) including herbal products and OTC medications.   Medications Current Outpatient Medications  Medication Sig Dispense Refill   Calcium Carb-Cholecalciferol (CALCIUM 500+D3) 500-10 MG-MCG TABS Take 1 tablet by mouth daily in the afternoon. 90 tablet 3   letrozole (FEMARA) 2.5 MG tablet Take 1 tablet (2.5 mg total) by mouth daily. 90 tablet 3   Neratinib Maleate (NERLYNX) 40 MG tablet Take 5 tablets (200 mg total) by mouth daily. Take with food. 140 tablet 1   No current facility-administered medications for this visit.    Drug-Drug Interactions (DDIs) DDIs were evaluated? Yes Significant DDIs? Yes , she will separate  out her calcium/vitd with the neratinib by at least 3 hours The patient was instructed to speak with their health care provider and/or the oral chemotherapy pharmacist before starting any new drug, including prescription or over the counter, natural / herbal products, or vitamins.  Supportive Care Joint pain: may consider over the counter NSAIDs Diarrhea: not experiencing currently but can use loperamide as needed  Dosing Assessment Hepatic adjustments needed? No  Renal adjustments needed? No  Toxicity adjustments needed? No  The current dosing regimen is appropriate to continue at this time.  Follow-Up Plan Increase neratinib to 5 tablets (200 mg) daily with food starting tomorrow. New prescription sent Continue letrozole 2.5 mg daily.  Use loperamide as needed if  diarrhea were to occur Consider OTC NSAIDs for joint pain Labs, Dr. Lindi [name] visit scheduled for 11/30/21. Per manufacturer guidelines, can be every 3 months thereafter if tolerating neratinib Labs, pharmacy clinic visit, on 03/01/22  Janet [name] participated in the discussion, expressed understanding, and voiced agreement with the above plan. All questions were answered to her satisfaction. The patient was advised to contact the clinic at [phone] with any questions or concerns prior to her return visit.  I spent 30 minutes assessing and educating the patient.  Raina Mina, RPH-CPP, 11/07/2021  10:36 AM   **Disclaimer: This note was dictated with voice recognition software. Similar sounding words can inadvertently be transcribed and this note may contain transcription errors which may not have been corrected upon publication of note.**

## 2021-11-23 ENCOUNTER — Inpatient Hospital Stay: Payer: 59 | Admitting: Hematology and Oncology

## 2021-11-23 ENCOUNTER — Inpatient Hospital Stay: Payer: 59

## 2021-11-27 NOTE — Progress Notes (Signed)
Patient Care Team: Charlynn Court, NP as PCP - General (Nurse Practitioner) Rockwell Germany, RN as Oncology Nurse Navigator Mauro Kaufmann, RN as Oncology Nurse Navigator  DIAGNOSIS:  Encounter Diagnosis  Name Primary?   Malignant neoplasm of lower-outer quadrant of left breast of female, estrogen receptor positive ([place])     SUMMARY OF ONCOLOGIC HISTORY: Oncology History  Malignant neoplasm of lower-outer quadrant of left breast of female, estrogen receptor positive ([place])  06/23/2020 Initial Diagnosis   Normal mammogram September 2021.  December 2021: Following trauma to the breast she felt a lump but for variety of reasons work-up was postponed.  Mammogram revealed 2 tumors measuring 7 cm individually 4 cm and 3 cm, 3 axillary lymph nodes, biopsy revealed grade 3 IDC ER 10%, PR 0%, Ki-67 40%, HER-2 +3+ by IHC, lymph node also positive   06/28/2020 Cancer Staging   Staging form: Breast, AJCC 8th Edition - Clinical stage from 06/28/2020: Stage IIB (cT2, cN1, cM0, G3, ER+, PR-, HER2+) - Signed by Nicholas Lose, MD on 06/28/2020 Stage prefix: Initial diagnosis Histologic grading system: 3 grade system   07/13/2020 - 11/16/2020 Chemotherapy   TCH Perjeta   11/30/2020 Surgery   left mastectomy: Residual IDC 0.9 cm, focal high-grade DCIS, 5/12 lymph nodes are positive including 1 micromet, margins negative, ER 10%, PR 0%, HER2 3+, Ki-67 40%   12/15/2020 - 07/13/2021 Chemotherapy   Patient is on Treatment Plan : BREAST ADO-Trastuzumab Emtansine (Kadcyla) q21d     12/26/2020 Cancer Staging   Staging form: Breast, AJCC 8th Edition - Pathologic stage from 12/26/2020: No Stage Recommended (ypT1c, pN2a, cM0, G3, ER+, PR-, HER2+) - Signed by Eppie Gibson, MD on 12/26/2020 Stage prefix: Post-therapy Histologic grading system: 3 grade system     CHIEF COMPLIANT: Follow-up on breast cancer on letrozole and Neratinib  INTERVAL HISTORY: Janet [name] is a 56 y.o. with above-mentioned history  of HER2 positive breast cancer on letrozole and Neratinib. She presents to the clinic today for a follow-up. She states that she is tolerating the Neratinib. Bowels under control. She is controlling her the way she eats. She is having joint stiffness from the letrozole. Knees are the worst. She does have some fatigue. She is taking the letrozole at night. She reports the joint stiffness is not horrible but she can tell it is not her norm.Not constant pain. Once she is up and moving the pain subsides. She does complain of a  rash in the chest bone that has spread to the breast.    ALLERGIES:  has No Known Allergies.  MEDICATIONS:  Current Outpatient Medications  Medication Sig Dispense Refill   anastrozole (ARIMIDEX) 1 MG tablet Take 1 tablet (1 mg total) by mouth daily. 90 tablet 3   Calcium Carb-Cholecalciferol (CALCIUM 500+D3) 500-10 MG-MCG TABS Take 1 tablet by mouth daily in the afternoon. 90 tablet 3   Neratinib Maleate (NERLYNX) 40 MG tablet Take 5 tablets (200 mg total) by mouth daily. Take with food. 140 tablet 1   No current facility-administered medications for this visit.    PHYSICAL EXAMINATION: ECOG PERFORMANCE STATUS: 1 - Symptomatic but completely ambulatory  Vitals:   11/30/21 0909  BP: 120/86  Pulse: 66  Resp: 18  Temp: 97.7 F (36.5 C)  SpO2: 98%   Filed Weights   11/30/21 0909  Weight: 172 lb 12.8 oz (78.4 kg)      LABORATORY DATA:  I have reviewed the data as listed    Latest Ref  Rng & Units 11/30/2021    9:05 AM 11/07/2021    9:45 AM 09/28/2021   10:03 AM  CMP  Glucose 70 - 99 mg/dL 100  90  84   BUN 6 - 20 mg/dL _0 Creatinine 0.44 - 1.00 mg/dL 0.80  0.83  0.85   Sodium 135 - 145 mmol/L 140  141  139   Potassium 3.5 - 5.1 mmol/L 4.0  4.1  4.1   Chloride 98 - 111 mmol/L 107  106  104   CO2 22 - 32 mmol/L 29  32  30   Calcium 8.9 - 10.3 mg/dL 9.8  9.8  10.2   Total Protein 6.5 - 8.1 g/dL 7.0  7.3  8.1   Total Bilirubin 0.3 - 1.2 mg/dL 0.7   0.6  0.7   Alkaline Phos 38 - 126 U/L 94  91  95   AST 15 - 41 U/L _1 ALT 0 - 44 U/L _2 Lab Results  Component Value Date   WBC 5.0 11/30/2021   HGB 13.3 11/30/2021   HCT 38.5 11/30/2021   MCV 91.0 11/30/2021   PLT 175 11/30/2021   NEUTROABS 3.0 11/30/2021    ASSESSMENT & PLAN:  Malignant neoplasm of lower-outer quadrant of left breast of female, estrogen receptor positive ([place]) 06/23/2020: Normal mammogram September 2021.  December 2021: Following trauma to the breast she felt a lump but for variety of reasons work-up was postponed.  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   Treatment Plan: 1. Neoadjuvant chemotherapy with TCH Perjeta 6 cycles followed by Kadcyla maintenance (because of residual disease after neoadjuvant chemo) for 1 year completed 07/13/2021 2. left mastectomy 11/30/2020: Residual IDC 0.9 cm, focal high-grade DCIS, 5/12 lymph nodes are positive including 1 micromet, margins negative, ER 10%, PR 0%, HER2 3+, Ki-67 40% 3. Followed by adjuvant radiation therapy completed 03/02/2021 4.  Followed by antiestrogen therapy started 03/30/2021 5.  Followed by neratinib MRI liver 07/11/20: Hepatic cysts Bone scan and CT CAP: No mets Breast MRI 06/28/2020: 2 large left breast masses each 0.5 cm, overall 7 cm, third contiguous mass 3 cm (could be a lymph node, abutting pectoralis muscle), 4 lymph nodes left axilla ---------------------------------------------------------------------------------------------------------------------------- Treatment plan: letrozole started 03/30/2021, neratinib started August 17, 2021   Letrozole Toxicities: Mild hot flashes and weight gain   Neratinib toxicities: Currently on 200 mg daily (5 tablets), because she is asymptomatic we will increase it to 6 tablets a day starting  today.    We will monitoring her minimal residual disease with Signatera  Return to clinic every 3 months for follow-up on neratinib    No orders of the defined types were placed in this encounter.  The patient has a good understanding of the overall plan. she agrees with it. she will call with any problems that may develop before the next visit here. Total time spent: 30 mins including face to face time and time spent for planning, charting and co-ordination of care   Harriette Ohara, MD 11/30/21    I Gardiner Coins am scribing for Dr. Lindi Adie  I have reviewed the above documentation for accuracy and completeness, and I agree with the above.

## 2021-11-30 ENCOUNTER — Inpatient Hospital Stay: Payer: 59 | Attending: Hematology and Oncology

## 2021-11-30 ENCOUNTER — Other Ambulatory Visit: Payer: Self-pay

## 2021-11-30 ENCOUNTER — Inpatient Hospital Stay (HOSPITAL_BASED_OUTPATIENT_CLINIC_OR_DEPARTMENT_OTHER): Payer: 59 | Admitting: Hematology and Oncology

## 2021-11-30 DIAGNOSIS — M2560 Stiffness of unspecified joint, not elsewhere classified: Secondary | ICD-10-CM | POA: Insufficient documentation

## 2021-11-30 DIAGNOSIS — K7689 Other specified diseases of liver: Secondary | ICD-10-CM | POA: Insufficient documentation

## 2021-11-30 DIAGNOSIS — Z170 Estrogen receptor positive status [ER+]: Secondary | ICD-10-CM | POA: Insufficient documentation

## 2021-11-30 DIAGNOSIS — Z9012 Acquired absence of left breast and nipple: Secondary | ICD-10-CM | POA: Insufficient documentation

## 2021-11-30 DIAGNOSIS — C50512 Malignant neoplasm of lower-outer quadrant of left female breast: Secondary | ICD-10-CM | POA: Diagnosis present

## 2021-11-30 DIAGNOSIS — Z79811 Long term (current) use of aromatase inhibitors: Secondary | ICD-10-CM | POA: Insufficient documentation

## 2021-11-30 LAB — CBC WITH DIFFERENTIAL (CANCER CENTER ONLY)
Abs Immature Granulocytes: 0.01 10*3/uL (ref 0.00–0.07)
Basophils Absolute: 0.1 10*3/uL (ref 0.0–0.1)
Basophils Relative: 1 %
Eosinophils Absolute: 0.2 10*3/uL (ref 0.0–0.5)
Eosinophils Relative: 4 %
HCT: 38.5 % (ref 36.0–46.0)
Hemoglobin: 13.3 g/dL (ref 12.0–15.0)
Immature Granulocytes: 0 %
Lymphocytes Relative: 26 %
Lymphs Abs: 1.3 10*3/uL (ref 0.7–4.0)
MCH: 31.4 pg (ref 26.0–34.0)
MCHC: 34.5 g/dL (ref 30.0–36.0)
MCV: 91 fL (ref 80.0–100.0)
Monocytes Absolute: 0.4 10*3/uL (ref 0.1–1.0)
Monocytes Relative: 8 %
Neutro Abs: 3 10*3/uL (ref 1.7–7.7)
Neutrophils Relative %: 61 %
Platelet Count: 175 10*3/uL (ref 150–400)
RBC: 4.23 MIL/uL (ref 3.87–5.11)
RDW: 13.1 % (ref 11.5–15.5)
WBC Count: 5 10*3/uL (ref 4.0–10.5)
nRBC: 0 % (ref 0.0–0.2)

## 2021-11-30 LAB — CMP (CANCER CENTER ONLY)
ALT: 27 U/L (ref 0–44)
AST: 27 U/L (ref 15–41)
Albumin: 4.3 g/dL (ref 3.5–5.0)
Alkaline Phosphatase: 94 U/L (ref 38–126)
Anion gap: 4 — ABNORMAL LOW (ref 5–15)
BUN: 15 mg/dL (ref 6–20)
CO2: 29 mmol/L (ref 22–32)
Calcium: 9.8 mg/dL (ref 8.9–10.3)
Chloride: 107 mmol/L (ref 98–111)
Creatinine: 0.8 mg/dL (ref 0.44–1.00)
GFR, Estimated: >60 mL/min (ref >60)
Glucose, Bld: 100 mg/dL — ABNORMAL HIGH (ref 70–99)
Potassium: 4 mmol/L (ref 3.5–5.1)
Sodium: 140 mmol/L (ref 135–145)
Total Bilirubin: 0.7 mg/dL (ref 0.3–1.2)
Total Protein: 7 g/dL (ref 6.5–8.1)

## 2021-11-30 MED ORDER — ANASTROZOLE 1 MG PO TABS: 1.0000 mg | ORAL_TABLET | Freq: Every day | ORAL | 3 refills | Status: DC

## 2021-11-30 NOTE — Assessment & Plan Note (Addendum)
06/23/2020:Normal mammogram September 2021. December 2021: Following trauma to the breast she felt a lump but for variety of reasons work-up was postponed. 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  Treatment Plan: 1. Neoadjuvant chemotherapy with TCH Perjeta 6 cycles followed by Kadcyla maintenance (because of residual disease afterneoadjuvant chemo) for 1 year completed 07/13/2021 2.left mastectomy 11/30/2020: Residual IDC 0.9 cm, focal high-grade DCIS,5/12 lymph nodes are positive including 1 micromet, margins negative, ER 10%, PR 0%, HER2 3+, Ki-67 40% 3. Followed by adjuvant radiation therapycompleted 03/02/2021 4.Followed by antiestrogen therapystarted 03/30/2021 5.Followed by neratinib MRI liver 07/11/20: Hepatic cysts Bone scan and CT CAP: No mets Breast MRI 06/28/2020: 2 large left breast masses each 0.5 cm, overall 7 cm, third contiguous mass 3 cm (could be a lymph node, abutting pectoralis muscle), 4 lymph nodes left axilla ---------------------------------------------------------------------------------------------------------------------------- Treatment plan: letrozole started 03/30/2021, neratinib started August 17, 2021  LetrozoleToxicities:Mild hot flashes and weight gain  Neratinibtoxicities: Currently on 200 mg daily (5 tablets), because she is asymptomatic we will increase it to 6 tablets a day starting today.   We will monitoring her minimal residual disease with Signatera  Return to clinic every 3 months for follow-up on neratinib

## 2022-01-03 ENCOUNTER — Other Ambulatory Visit: Payer: Self-pay | Admitting: Hematology and Oncology

## 2022-01-03 DIAGNOSIS — Z170 Estrogen receptor positive status [ER+]: Secondary | ICD-10-CM

## 2022-01-04 ENCOUNTER — Inpatient Hospital Stay: Payer: 59 | Attending: Hematology and Oncology | Admitting: Pharmacist

## 2022-01-04 ENCOUNTER — Encounter: Payer: Self-pay | Admitting: Hematology and Oncology

## 2022-01-04 DIAGNOSIS — C50512 Malignant neoplasm of lower-outer quadrant of left female breast: Secondary | ICD-10-CM

## 2022-01-04 MED ORDER — NERATINIB MALEATE 40 MG PO TABS: 240.0000 mg | ORAL_TABLET | Freq: Every day | ORAL | 1 refills | Status: DC

## 2022-01-04 NOTE — Progress Notes (Signed)
[place]       Telephone: [phone]?Fax: [phone]   Oncology Clinical Pharmacist Practitioner Progress Note  Janet [name] is a 56 y.o. female with a diagnosis of breast cancer currently on neratinib under the care of Dr. Nicholas Lose. They were contacted today via telephone. I connected with Janet [name] on 01/04/22 at 11:00 AM EDT by telephone and verified that I am speaking with the correct person using two identifiers.   Other persons participating in the visit and their role in the encounter: N/A   Interval History Ms. Stradley was contacted today by clinical pharmacy as a follow-up to her neratinib management.  We had received a refill request from Dr. Geralyn Flash nurse from [facility].  This refill request was for 5 tablets daily and upon review Dr. Lindi Adie and Ms. Olaes had agreed to increase her dose to neratinib 6 tablets daily on 11/30/21.  We contacted the patient via telephone and she did state that she started 6 tablets on 12/01/21 and continued on this regimen until 12/23/21.  At that time, because she was going on vacation, she decided to go back to 5 tablets until she returns on 01/14/22.  She is comfortable restarting neratinib 6 tablets daily with food on 01/14/22 and so a new prescription for 6 tablets daily with food has been sent to [facility] with a start date of 01/14/22.  We have also put comments in the prescription notes to the pharmacy not to send the prescription prior to 01/14/22 since Ms. Warbington will not be at home.  Follow-Up Plan Continue on neratinib 5 tablets daily with food and then start 6 tablets with food on 01/14/22.  A new prescription for neratinib at the 6 tablet dose has been sent to [facility] per the patient request. We will add labs, pharmacy clinic visit, in 4 weeks.   At Dr. Geralyn Flash last visit on 11/30/21, he stated he would see the patient in 3 months so we will schedule this  appointment for mid December.  Clinical pharmacy was plan to see her on 03/01/22 we will switch this to Dr. Lindi Adie visit instead. If she continues to tolerate neratinib, she can likely be seen every 3 months after her 03/01/22 visit.  Janet [name] participated in the discussion, expressed understanding, and voiced agreement with the above plan. All questions were answered to her satisfaction. The patient was advised to contact the clinic at [phone] with any questions or concerns prior to her return visit.  Clinical pharmacy will continue to support Janet [name] and Dr. Nicholas Lose as needed.  I spent 15 minutes assessing the patient.  Raina Mina, RPH-CPP,  01/04/2022  11:53 AM   **Disclaimer: This note was dictated with voice recognition software. Similar sounding words can inadvertently be transcribed and this note may contain transcription errors which may not have been corrected upon publication of note.**

## 2022-01-18 ENCOUNTER — Other Ambulatory Visit: Payer: 59

## 2022-01-31 ENCOUNTER — Inpatient Hospital Stay: Payer: 59 | Attending: Hematology and Oncology

## 2022-01-31 ENCOUNTER — Other Ambulatory Visit: Payer: Self-pay

## 2022-01-31 ENCOUNTER — Inpatient Hospital Stay: Payer: 59 | Admitting: Pharmacist

## 2022-01-31 VITALS — BP 125/86 | HR 74 | Temp 97.7°F | Resp 18 | Ht 69.0 in | Wt 174.2 lb

## 2022-01-31 DIAGNOSIS — Z79899 Other long term (current) drug therapy: Secondary | ICD-10-CM | POA: Diagnosis not present

## 2022-01-31 DIAGNOSIS — Z170 Estrogen receptor positive status [ER+]: Secondary | ICD-10-CM | POA: Diagnosis not present

## 2022-01-31 DIAGNOSIS — R232 Flushing: Secondary | ICD-10-CM | POA: Diagnosis not present

## 2022-01-31 DIAGNOSIS — C50512 Malignant neoplasm of lower-outer quadrant of left female breast: Secondary | ICD-10-CM | POA: Insufficient documentation

## 2022-01-31 LAB — CBC WITH DIFFERENTIAL (CANCER CENTER ONLY)
Abs Immature Granulocytes: 0.01 10*3/uL (ref 0.00–0.07)
Basophils Absolute: 0 10*3/uL (ref 0.0–0.1)
Basophils Relative: 1 %
Eosinophils Absolute: 0.2 10*3/uL (ref 0.0–0.5)
Eosinophils Relative: 3 %
HCT: 40.9 % (ref 36.0–46.0)
Hemoglobin: 13.8 g/dL (ref 12.0–15.0)
Immature Granulocytes: 0 %
Lymphocytes Relative: 25 %
Lymphs Abs: 1.4 10*3/uL (ref 0.7–4.0)
MCH: 31.6 pg (ref 26.0–34.0)
MCHC: 33.7 g/dL (ref 30.0–36.0)
MCV: 93.6 fL (ref 80.0–100.0)
Monocytes Absolute: 0.5 10*3/uL (ref 0.1–1.0)
Monocytes Relative: 8 %
Neutro Abs: 3.5 10*3/uL (ref 1.7–7.7)
Neutrophils Relative %: 63 %
Platelet Count: 185 10*3/uL (ref 150–400)
RBC: 4.37 MIL/uL (ref 3.87–5.11)
RDW: 12.6 % (ref 11.5–15.5)
WBC Count: 5.6 10*3/uL (ref 4.0–10.5)
nRBC: 0 % (ref 0.0–0.2)

## 2022-01-31 LAB — CMP (CANCER CENTER ONLY)
ALT: 22 U/L (ref 0–44)
AST: 24 U/L (ref 15–41)
Albumin: 4.1 g/dL (ref 3.5–5.0)
Alkaline Phosphatase: 85 U/L (ref 38–126)
Anion gap: 4 — ABNORMAL LOW (ref 5–15)
BUN: 17 mg/dL (ref 6–20)
CO2: 31 mmol/L (ref 22–32)
Calcium: 9.6 mg/dL (ref 8.9–10.3)
Chloride: 108 mmol/L (ref 98–111)
Creatinine: 0.79 mg/dL (ref 0.44–1.00)
GFR, Estimated: >60 mL/min (ref >60)
Glucose, Bld: 103 mg/dL — ABNORMAL HIGH (ref 70–99)
Potassium: 4.6 mmol/L (ref 3.5–5.1)
Sodium: 143 mmol/L (ref 135–145)
Total Bilirubin: 0.5 mg/dL (ref 0.3–1.2)
Total Protein: 7.6 g/dL (ref 6.5–8.1)

## 2022-01-31 NOTE — Progress Notes (Signed)
Janet [name]       Telephone: [phone]?Fax: [phone]   Oncology Clinical Pharmacist Practitioner Progress Note  Janet [name] was contacted via in-person visit to discuss her chemotherapy regimen for neratinib which they receive under the care of Dr. Nicholas Lose.    Current treatment regimen and start date Neratinib (08/17/21) Letrozole (03/30/21) -- discontinued 11/30/21 Anastrozole (11/30/21)   Interval History She continues on neratinib 4 tablets (160 mg) by mouth daily on days 1 to 28 of a 28-day cycle. This is being given in combination with anastrozole 1 mg by mouth daily. Therapy is planned to continue until 1 year of neratinib therapy in the extended adjuvant setting (08/18/22).  Janet [name] is here [name] as a follow-up to her neratinib.  She was last seen by Dr. Lindi Adie on 11/30/21 and had a telephone visit with clinical pharmacy on 01/04/22.  At that time she had restarted 6 tablets of neratinib then had lowered the dose to 5 tablets while on vacation.  She restarted the 6 tablets on 01/15/22.  Response to Therapy Janet [name] is doing well.  She is tolerating full dose of neratinib 240 mg daily with food very well.  She is having some mild diarrhea but is not needing to take loperamide.  She is not having any nausea or vomiting.  Her labs continue to show no signs of toxicities from the neratinib.  She does report having some toenail fungus that she will be seeing podiatry for.  She states that she may try light therapy at first but then may go to an antifungal later on.  We did review a possible agent, terbinafine, and there does not appear to be any known interactions with her current med list including neratinib.  She will next see Dr. Lindi Adie on 03/01/22 and at that time if she continues to tolerate neratinib, she can likely be seen every 3 months.  Clinical pharmacy will see her again in mid March and then she will see Dr. Lindi Adie again at the end of May when she is  finishing up her 1 year of neratinib in the extended adjuvant setting. Labs, vitals, treatment parameters, and manufacturer guidelines assessing toxicity were reviewed with Janet [name]. Based on these values, patient is in agreement to continue neratinib therapy at this time.  Allergies No Known Allergies  Vitals    01/31/2022    9:08 AM 11/30/2021    9:09 AM 11/07/2021   10:02 AM  Oncology Vitals  Height 175 cm 175 cm 175 cm  Weight 79.017 kg 78.382 kg 77.61 kg  Weight (lbs) 174 lbs 3 oz 172 lbs 13 oz 171 lbs 2 oz  BMI 25.72 kg/m2   25.72 kg/m2 25.52 kg/m2   25.52 kg/m2 25.27 kg/m2   25.27 kg/m2  Temp 97.7 F (36.5 C) 97.7 F (36.5 C)   Pulse Rate 74 66 77  BP 125/86 120/86 118/79  Resp '18 18 18  '$ SpO2 100 % 98 % 100 %  BSA (m2) 1.96 m2   1.96 m2 1.95 m2   1.95 m2 1.94 m2   1.94 m2    Laboratory Data    Latest Ref Rng & Units 01/31/2022    8:15 AM 11/30/2021    9:05 AM 11/07/2021    9:45 AM  CBC EXTENDED  WBC 4.0 - 10.5 K/uL 5.6  5.0  4.4   RBC 3.87 - 5.11 MIL/uL 4.37  4.23  4.18   Hemoglobin 12.0 - 15.0  g/dL 13.8  13.3  13.3   HCT 36.0 - 46.0 % 40.9  38.5  37.4   Platelets 150 - 400 K/uL 185  175  170   NEUT# 1.7 - 7.7 K/uL 3.5  3.0  2.6   Lymph# 0.7 - 4.0 K/uL 1.4  1.3  1.2        Latest Ref Rng & Units 01/31/2022    8:15 AM 11/30/2021    9:05 AM 11/07/2021    9:45 AM  CMP  Glucose 70 - 99 mg/dL 103  100  90   BUN 6 - 20 mg/dL '17  15  16   '$ Creatinine 0.44 - 1.00 mg/dL 0.79  0.80  0.83   Sodium 135 - 145 mmol/L 143  140  141   Potassium 3.5 - 5.1 mmol/L 4.6  4.0  4.1   Chloride 98 - 111 mmol/L 108  107  106   CO2 22 - 32 mmol/L 31  29  32   Calcium 8.9 - 10.3 mg/dL 9.6  9.8  9.8   Total Protein 6.5 - 8.1 g/dL 7.6  7.0  7.3   Total Bilirubin 0.3 - 1.2 mg/dL 0.5  0.7  0.6   Alkaline Phos 38 - 126 U/L 85  94  91   AST 15 - 41 U/L '24  27  27   '$ ALT 0 - 44 U/L '22  27  27    '$ Adverse Effects Assessment Diarrhea: Very mild and not needing loperamide at  this time.  Adherence Assessment Janet [name] reports missing 0 doses over the past 4 weeks.   Reason for missed dose: N/A Patient was re-educated on importance of adherence.   Access Assessment Janet [name] is currently receiving her neratinib through [facility] concerns: None  Medication Reconciliation The patient's medication list was reviewed [name] with the patient?  Yes New medications or herbal supplements have recently been started?  Yes, as above, she stopped letrozole and is now on anastrozole per Dr. Lindi Adie.  This is active on her medication list Any medications have been discontinued?  Yes, stopped letrozole. The medication list was updated and reconciled based on the patient's most recent medication list in the electronic medical record (EMR) including herbal products and OTC medications.   Medications Current Outpatient Medications  Medication Sig Dispense Refill   anastrozole (ARIMIDEX) 1 MG tablet Take 1 tablet (1 mg total) by mouth daily. 90 tablet 3   Calcium Carb-Cholecalciferol (CALCIUM 500+D3) 500-10 MG-MCG TABS Take 1 tablet by mouth daily in the afternoon. 90 tablet 3   Neratinib Maleate (NERLYNX) 40 MG tablet Take 6 tablets (240 mg total) by mouth daily. Take with food. 168 tablet 1   No current facility-administered medications for this visit.    Drug-Drug Interactions (DDIs) DDIs were evaluated?  Yes Significant DDIs?  yes, we have reviewed in the past that her calcium and neratinib need to be separated The patient was instructed to speak with their health care provider and/or the oral chemotherapy pharmacist before starting any new drug, including prescription or over the counter, natural / herbal products, or vitamins.  Supportive Care She did report some dry skin on her heel with some cracking but no signs of infection.  She said the cracking is very mild and we recommended she could potentially use Neosporin on that area but  also use topical lotions and creams. As above, she may start light therapy for toenail fungus.  If that does not  work, her podiatrist may prescribe an antifungal.  We did review possible option, terbinafine, and there does not seem to be any interaction with her current medication list including neratinib.  Dosing Assessment Hepatic adjustments needed?  No Renal adjustments needed?  No Toxicity adjustments needed?  No The current dosing regimen is appropriate to continue at this time.  Follow-Up Plan Continue neratinib 6 tablets (240 mg) by mouth daily with food Continue anastrozole 1 mg by mouth daily Use loperamide as needed for diarrhea Labs, Dr. Lindi Adie visit, on 03/01/22.  After this visit her labs and visits can likely be every 3 months if she continues to tolerate neratinib well. Labs, pharmacy clinic visit, on 05/30/21 Labs, Dr. Lindi Adie visit, at the end of May.  This will likely be her last visit for she stops neratinib which she started on 08/17/21.  She will continue on with anastrozole after that.  Janet [name] participated in the discussion, expressed understanding, and voiced agreement with the above plan. All questions were answered to her satisfaction. The patient was advised to contact the clinic at [phone] with any questions or concerns prior to her return visit.   I spent 30 minutes assessing and educating the patient.  Raina Mina, RPH-CPP, 01/31/2022  9:31 AM   **Disclaimer: This note was dictated with voice recognition software. Similar sounding words can inadvertently be transcribed and this note may contain transcription errors which may not have been corrected upon publication of note.**

## 2022-02-01 ENCOUNTER — Telehealth: Payer: Self-pay | Admitting: Pharmacist

## 2022-02-01 NOTE — Telephone Encounter (Signed)
Scheduled appointment per 11/15 los. Patient is aware.

## 2022-02-05 ENCOUNTER — Encounter: Payer: Self-pay | Admitting: Hematology and Oncology

## 2022-02-19 ENCOUNTER — Ambulatory Visit (INDEPENDENT_AMBULATORY_CARE_PROVIDER_SITE_OTHER): Payer: 59 | Admitting: Podiatry

## 2022-02-19 DIAGNOSIS — L853 Xerosis cutis: Secondary | ICD-10-CM | POA: Diagnosis not present

## 2022-02-19 DIAGNOSIS — B351 Tinea unguium: Secondary | ICD-10-CM

## 2022-02-19 MED ORDER — CICLOPIROX 8 % EX SOLN: Freq: Every day | CUTANEOUS | 0 refills | Status: AC

## 2022-02-19 NOTE — Progress Notes (Signed)
  Subjective:  Patient ID: TERSA [name], female    DOB: 1965/05/31,  MRN: [id number]  Chief Complaint  Patient presents with   Nail Problem    right foot toenail fungus/ heel cracking    56 y.o. female presents with concern for nail fungal infection on the right foot.  She also has some dry cracking skin on the right heel.  She says she is taking a chemotherapeutic agent orally.  She does need to have her liver enzymes checked for this medication.  She is not sure if she is able to take it but will check with her oncologist.  She reports the nails are not painful but there is discoloration thickening and abnormal growth and she does not want to spread it is primarily contained to the first fourth and fifth toes of the right foot no issues with the left foot at this time.  Past Medical History:  Diagnosis Date   Anemia    Fibrocystic breast disease    Heart murmur    Mitral regurgitation    Mild by echo 09/17/11.   Neuromuscular disorder (HCC)    mild neuropathy   Varicose vein of leg     No Known Allergies  ROS: Negative except as per HPI above  Objective:  General: AAO x3, NAD  Dermatological: yellow/white discoloration, nail plate irregularity, pitting, ridging dystrophic growth esp of the 1st 4th and 5th nails on the right foot.   Vascular:  Dorsalis Pedis artery and Posterior Tibial artery pedal pulses are 2/4 bilateral.  Capillary fill time < 3 sec to all digits.   Neruologic: Grossly intact via light touch bilateral. Protective threshold intact to all sites bilateral.   Musculoskeletal: No gross boney pedal deformities bilateral. No pain, crepitus, or limitation noted with foot and ankle range of motion bilateral. Muscular strength 5/5 in all groups tested bilateral.  Gait: Unassisted, Nonantalgic.   No images are attached to the encounter.   Assessment:   1. Onychomycosis   2. Xerosis of skin      Plan:  Patient was evaluated and treated and all questions  answered.  #Onychomycosis of nails R 1st 4th and 5th toenails -Educated on etiology of nail fungus. -Will proceed with laser therapy for right foot 1st 4th and 5th onychomycosis.  -Will hold on oral antifungal terbinafine at this time related to pt begin on chemotherapeutic agent that are metabolized in liver.  -Proceed with laser therapy and  -eRx for Penlac 8% solution apply daily at night topically to all affected nails on the right foot  #xerosis of heel - Recommend lotion for the right heel dry cracked skin - Recommend oKeefes foot repair balm   Return in about 3 months (around 05/21/2022) for Follow up R foot onychomycosis.          [name] Janet, DPM [facility]

## 2022-02-20 ENCOUNTER — Encounter: Payer: Self-pay | Admitting: Physician Assistant

## 2022-02-21 ENCOUNTER — Telehealth: Payer: Self-pay

## 2022-02-21 ENCOUNTER — Encounter: Payer: Self-pay | Admitting: Hematology and Oncology

## 2022-02-21 ENCOUNTER — Inpatient Hospital Stay: Payer: 59 | Attending: Hematology and Oncology | Admitting: Physician Assistant

## 2022-02-21 DIAGNOSIS — Z170 Estrogen receptor positive status [ER+]: Secondary | ICD-10-CM | POA: Diagnosis not present

## 2022-02-21 DIAGNOSIS — U071 COVID-19: Secondary | ICD-10-CM

## 2022-02-21 DIAGNOSIS — C50512 Malignant neoplasm of lower-outer quadrant of left female breast: Secondary | ICD-10-CM

## 2022-02-21 NOTE — Progress Notes (Signed)
I connected with Madilyn Fireman on 02/21/22 at  1:00 PM EST by telephone and verified that I am speaking with the correct person using two identifiers.   I discussed the limitations, risks, security and privacy concerns of performing an evaluation and management service by telemedicine and the availability of in-person appointments. I also discussed with the patient that there may be a patient responsible charge related to this service. The patient expressed understanding and agreed to proceed.   Other persons participating in the visit and their role in the encounter: none   Patient's location: home  Provider's location: office at [place] [facility] [place]    Patient Care Team: Charlynn Court, NP as PCP - General (Nurse Practitioner) Rockwell Germany, RN as Oncology Nurse Navigator Mauro Kaufmann, RN as Oncology Nurse Navigator Nicholas Lose, MD as Medical Oncologist (Hematology and Oncology) Raina Mina, RPH-CPP as Pharmacist (Hematology and Oncology)    Name of the patient: Janet [name]  [id number]  [birth year]   Date of visit: 02/21/2022    Chief complaint/ Reason for visit- covid positive  Oncology History  Malignant neoplasm of lower-outer quadrant of left breast of female, estrogen receptor positive ([place])  06/23/2020 Initial Diagnosis   Normal mammogram September 2021.  December 2021: Following trauma to the breast she felt a lump but for variety of reasons work-up was postponed.  Mammogram revealed 2 tumors measuring 7 cm individually 4 cm and 3 cm, 3 axillary lymph nodes, biopsy revealed grade 3 IDC ER 10%, PR 0%, Ki-67 40%, HER-2 +3+ by IHC, lymph node also positive   06/28/2020 Cancer Staging   Staging form: Breast, AJCC 8th Edition - Clinical stage from 06/28/2020: Stage IIB (cT2, cN1, cM0, G3, ER+, PR-, HER2+) - Signed by Nicholas Lose, MD on 06/28/2020 Stage prefix: Initial diagnosis Histologic grading system: 3 grade  system   07/13/2020 - 11/16/2020 Chemotherapy   [facility]   11/30/2020 Surgery   left mastectomy: Residual IDC 0.9 cm, focal high-grade DCIS, 5/12 lymph nodes are positive including 1 micromet, margins negative, ER 10%, PR 0%, HER2 3+, Ki-67 40%   12/15/2020 - 07/13/2021 Chemotherapy   Patient is on Treatment Plan : BREAST ADO-Trastuzumab Emtansine ([place]) q21d     12/26/2020 Cancer Staging   Staging form: Breast, AJCC 8th Edition - Pathologic stage from 12/26/2020: No Stage Recommended (ypT1c, pN2a, cM0, G3, ER+, PR-, HER2+) - Signed by Eppie Gibson, MD on 12/26/2020 Stage prefix: Post-therapy Histologic grading system: 3 grade system     Current Therapy: anastrazole and Neratinib   Interval history- SHOSHANAH [name] is a 56 yo female with oncologic history as above contacted today via telephone for chief complaint of being COVID-positive.  Patient states her symptoms started yesterday prompted her to take a home test.  She woke up feeling nauseous and later in the day developed headache, chills and bodyaches.  She took Tylenol and symptoms improved.  She also laid on a heating pad which she thinks helped the aches.  When she woke up this morning headache was still there which prompted her to take Tylenol again.  She reports now her symptoms are similar to a cold.  She has been monitoring her temperature and has not had fever.  Also denies any cough, congestion, chest pain, shortness of breath.  Patient had COVID prior to her breast cancer diagnosis and tolerated it well overall she reports.    ROS  All other systems are reviewed and are negative for acute change except as noted in the HPI.    No Known Allergies   Past Medical History:  Diagnosis Date   Anemia    Fibrocystic breast disease    Heart murmur    Mitral regurgitation    Mild by echo 09/17/11.   Neuromuscular disorder ([place])    mild neuropathy   Varicose vein of leg      Past Surgical History:  Procedure  Laterality Date   BREAST BIOPSY     BREAST BIOPSY     BREAST LUMPECTOMY     Fibroadenoma of the left breast   DILATATION & CURETTAGE/HYSTEROSCOPY WITH MYOSURE N/A 01/26/2016   Procedure: DILATATION & [place] WITH MYOSURE;  Surgeon: Allyn Kenner, DO;  Location: [facility];  Service: Gynecology;  Laterality: N/A;   HYSTEROSCOPY WITH NOVASURE N/A 01/26/2016   Procedure: HYSTEROSCOPY WITH NOVASURE;  Surgeon: Allyn Kenner, DO;  Location: [facility];  Service: Gynecology;  Laterality: N/A;   PORTACATH PLACEMENT Right 07/12/2020   Procedure: INSERTION PORT-A-CATH RIGHT INTERNAL JUGULAR;  Surgeon: Rolm Bookbinder, MD;  Location: [facility];  Service: General;  Laterality: Right;   RADIOACTIVE SEED GUIDED AXILLARY SENTINEL LYMPH NODE Left 11/30/2020   Procedure: LEFT AXILLARY NODE SEED GUIDED EXCISION;  Surgeon: Rolm Bookbinder, MD;  Location: [facility];  Service: General;  Laterality: Left;   SIMPLE MASTECTOMY WITH AXILLARY SENTINEL NODE BIOPSY Left 11/30/2020   Procedure: LEFT SIMPLE MASTECTOMY WITH AXILLARY SENTINEL NODE BIOPSY;  Surgeon: Rolm Bookbinder, MD;  Location: [facility];  Service: General;  Laterality: Left;   [place]     with lasor R-leg   WISDOM TOOTH EXTRACTION      Social History   Socioeconomic History   Marital status: Married    Spouse name: Not on file   Number of children: 2   Years of education: Not on file   Highest education level: Not on file  Occupational History   Occupation: Biochemist, clinical  Tobacco Use   Smoking status: Former    Packs/day: 1.00    Years: 20.00    Total pack years: 20.00    Types: Cigarettes    Quit date: 03/19/2002    Years since quitting: 19.9   Smokeless tobacco: Never  Vaping Use   Vaping Use: Never used  Substance and Sexual Activity   Alcohol use: No   Drug use: No   Sexual activity: Yes    Birth control/protection: None  Other Topics Concern   Not on file  Social History Narrative   Not on file   Social  Determinants of Health   Financial Resource Strain: Not on file  Food Insecurity: Not on file  Transportation Needs: Not on file  Physical Activity: Not on file  Stress: Not on file  Social Connections: Not on file  Intimate Partner Violence: Not on file    Family History  Problem Relation Age of Onset   Cancer Father        Lung or esphageal   Hypertension Mother    Diabetes Mother    Coronary artery disease Paternal Uncle      Current Outpatient Medications:    anastrozole (ARIMIDEX) 1 MG tablet, Take 1 tablet (1 mg total) by mouth daily., Disp: 90 tablet, Rfl: 3   Calcium Carb-Cholecalciferol (CALCIUM 500+D3) 500-10 MG-MCG TABS, Take 1 tablet by mouth daily in the afternoon., Disp: 90 tablet, Rfl: 3   ciclopirox (PENLAC) 8 % solution, Apply topically at  bedtime. Apply over nail and surrounding skin. Apply daily over previous coat. After seven (7) days, may remove with alcohol and continue cycle., Disp: 6.6 mL, Rfl: 0   Neratinib Maleate (NERLYNX) 40 MG tablet, Take 6 tablets (240 mg total) by mouth daily. Take with food., Disp: 168 tablet, Rfl: 1  PHYSICAL EXAM: ECOG FS:1 - Symptomatic but completely ambulatory   There were no vitals filed for this visit.  Patient speaking in clear sentences, no respiratory distress    LABORATORY DATA: I have reviewed the data as listed    Latest Ref Rng & Units 01/31/2022    8:15 AM 11/30/2021    9:05 AM 11/07/2021    9:45 AM  CBC  WBC 4.0 - 10.5 K/uL 5.6  5.0  4.4   Hemoglobin 12.0 - 15.0 g/dL 13.8  13.3  13.3   Hematocrit 36.0 - 46.0 % 40.9  38.5  37.4   Platelets 150 - 400 K/uL 185  175  170         Latest Ref Rng & Units 01/31/2022    8:15 AM 11/30/2021    9:05 AM 11/07/2021    9:45 AM  CMP  Glucose 70 - 99 mg/dL 103  100  90   BUN 6 - 20 mg/dL _0 Creatinine 0.44 - 1.00 mg/dL 0.79  0.80  0.83   Sodium 135 - 145 mmol/L 143  140  141   Potassium 3.5 - 5.1 mmol/L 4.6  4.0  4.1   Chloride 98 - 111 mmol/L 108   107  106   CO2 22 - 32 mmol/L 31  29  32   Calcium 8.9 - 10.3 mg/dL 9.6  9.8  9.8   Total Protein 6.5 - 8.1 g/dL 7.6  7.0  7.3   Total Bilirubin 0.3 - 1.2 mg/dL 0.5  0.7  0.6   Alkaline Phos 38 - 126 U/L 85  94  91   AST 15 - 41 U/L _1 ALT 0 - 44 U/L _2 RADIOGRAPHIC STUDIES: I have personally reviewed the radiological images as listed and agreed with the findings in the report. No images are attached to the encounter. No results found.   ASSESSMENT & PLAN: Patient is a 56 y.o. female with oncologic history of ER positive left breast cancer stage IIb followed by Dr. Lindi Adie.   #Covid Discussed risk vs benefit of Paxlovid with patient. With how minor her symptoms are she does not want Paxlovid. I feel this is reasonable and discussed OTC symptomatic care. -Patient will hold neratininb while symptomatic. -Discussed strict ED precautions.  #ER positive left breast cancer stage IB -Appointment for next week with Dr. Lindi Adie will be rescheduled to the first week of January per his request.    Visit Diagnosis: 1. COVID   2. Malignant neoplasm of lower-outer quadrant of left breast of female, estrogen receptor positive ([place])      No orders of the defined types were placed in this encounter.   All questions were answered. The patient knows to call the clinic with any problems, questions or concerns. No barriers to learning was detected.  Time spent with patient on telephone encounter: 10 minutes   Thank you for allowing me to participate in the care of this patient.    Barrie Folk, PA-C Department of Hematology/Oncology [facility]  Hospital Phone: [phone]  [phone]    02/21/2022 1:55 PM

## 2022-02-21 NOTE — Telephone Encounter (Signed)
Left voicemail for patient regarding her recent mychart message about medication pending covid test. This RN advised patient to call [facility] back at her earliest convenience so we can go over information regarding covid and possible medications she can take.

## 2022-02-27 ENCOUNTER — Telehealth: Payer: Self-pay | Admitting: Hematology and Oncology

## 2022-02-27 ENCOUNTER — Other Ambulatory Visit: Payer: Self-pay | Admitting: Hematology and Oncology

## 2022-02-27 NOTE — Telephone Encounter (Signed)
Rescheduled appointments per 12/12 secure chat. Left voicemail.

## 2022-02-28 ENCOUNTER — Other Ambulatory Visit: Payer: 59

## 2022-03-01 ENCOUNTER — Ambulatory Visit: Payer: 59 | Admitting: Hematology and Oncology

## 2022-03-01 ENCOUNTER — Other Ambulatory Visit: Payer: 59

## 2022-03-19 NOTE — Progress Notes (Signed)
Patient Care Team: Charlynn Court, NP as PCP - General (Nurse Practitioner) Rockwell Germany, RN as Oncology Nurse Navigator Mauro Kaufmann, RN as Oncology Nurse Navigator Nicholas Lose, MD as Medical Oncologist (Hematology and Oncology) Raina Mina, RPH-CPP as Pharmacist (Hematology and Oncology)  DIAGNOSIS:  Encounter Diagnosis  Name Primary?   Malignant neoplasm of lower-outer quadrant of left breast of female, estrogen receptor positive ([place]) Yes    SUMMARY OF ONCOLOGIC HISTORY: Oncology History  Malignant neoplasm of lower-outer quadrant of left breast of female, estrogen receptor positive ([place])  06/23/2020 Initial Diagnosis   Normal mammogram September 2021.  December 2021: Following trauma to the breast she felt a lump but for variety of reasons work-up was postponed.  Mammogram revealed 2 tumors measuring 7 cm individually 4 cm and 3 cm, 3 axillary lymph nodes, biopsy revealed grade 3 IDC ER 10%, PR 0%, Ki-67 40%, HER-2 +3+ by IHC, lymph node also positive   06/28/2020 Cancer Staging   Staging form: Breast, AJCC 8th Edition - Clinical stage from 06/28/2020: Stage IIB (cT2, cN1, cM0, G3, ER+, PR-, HER2+) - Signed by Nicholas Lose, MD on 06/28/2020 Stage prefix: Initial diagnosis Histologic grading system: 3 grade system   07/13/2020 - 11/16/2020 Chemotherapy   TCH Perjeta   11/30/2020 Surgery   left mastectomy: Residual IDC 0.9 cm, focal high-grade DCIS, 5/12 lymph nodes are positive including 1 micromet, margins negative, ER 10%, PR 0%, HER2 3+, Ki-67 40%   12/15/2020 - 07/13/2021 Chemotherapy   Patient is on Treatment Plan : BREAST ADO-Trastuzumab Emtansine (Kadcyla) q21d     12/26/2020 Cancer Staging   Staging form: Breast, AJCC 8th Edition - Pathologic stage from 12/26/2020: No Stage Recommended (ypT1c, pN2a, cM0, G3, ER+, PR-, HER2+) - Signed by Eppie Gibson, MD on 12/26/2020 Stage prefix: Post-therapy Histologic grading system: 3 grade system     CHIEF  COMPLIANT: Follow-up on breast cancer on anastrozole and Neratinib   INTERVAL HISTORY: Janet [name] is a 57 y.o. with above-mentioned history of HER2 positive breast cancer on letrozole and Neratinib. She presents to the clinic today for a follow-up. She reports that she is tolerating the  Neratinib. She states she just have to watch what she eats. She still having the aches but she says she is able to tolerate the anastrozole better.   ALLERGIES:  has No Known Allergies.  MEDICATIONS:  Current Outpatient Medications  Medication Sig Dispense Refill   anastrozole (ARIMIDEX) 1 MG tablet Take 1 tablet (1 mg total) by mouth daily. 90 tablet 3   Calcium Carb-Cholecalciferol (CALCIUM 500+D3) 500-10 MG-MCG TABS Take 1 tablet by mouth daily in the afternoon. 90 tablet 3   ciclopirox (PENLAC) 8 % solution Apply topically at bedtime. Apply over nail and surrounding skin. Apply daily over previous coat. After seven (7) days, may remove with alcohol and continue cycle. 6.6 mL 0   Neratinib Maleate (NERLYNX) 40 MG tablet TAKE 6 TABLETS (240MG) BY MOUTH  ONCE DAILY WITH FOOD 168 tablet 5   No current facility-administered medications for this visit.    PHYSICAL EXAMINATION: ECOG PERFORMANCE STATUS: 1 - Symptomatic but completely ambulatory  Vitals:   03/23/22 0905  BP: 123/88  Pulse: 80  Temp: 98.2 F (36.8 C)  SpO2: 100%   Filed Weights   03/23/22 0905  Weight: 174 lb 8 oz (79.2 kg)    BREAST left chest wall nodule (exam performed in the presence of a chaperone)  LABORATORY DATA:  I have reviewed  the data as listed    Latest Ref Rng & Units 03/23/2022    8:52 AM 01/31/2022    8:15 AM 11/30/2021    9:05 AM  CMP  Glucose 70 - 99 mg/dL 76  103  100   BUN 6 - 20 mg/dL _0 Creatinine 0.44 - 1.00 mg/dL 0.92  0.79  0.80   Sodium 135 - 145 mmol/L 139  143  140   Potassium 3.5 - 5.1 mmol/L 5.0  4.6  4.0   Chloride 98 - 111 mmol/L 105  108  107   CO2 22 - 32 mmol/L _1 Calcium 8.9 - 10.3 mg/dL 9.8  9.6  9.8   Total Protein 6.5 - 8.1 g/dL 6.8  7.6  7.0   Total Bilirubin 0.3 - 1.2 mg/dL 0.4  0.5  0.7   Alkaline Phos 38 - 126 U/L 95  85  94   AST 15 - 41 U/L _2 ALT 0 - 44 U/L _3 Lab Results  Component Value Date   WBC 6.0 03/23/2022   HGB 13.6 03/23/2022   HCT 39.6 03/23/2022   MCV 91.2 03/23/2022   PLT 191 03/23/2022   NEUTROABS 3.8 03/23/2022    ASSESSMENT & PLAN:  Malignant neoplasm of lower-outer quadrant of left breast of female, estrogen receptor positive ([place]) 06/23/2020: Normal mammogram September 2021.  December 2021: Following trauma to the breast she felt a lump but for variety of reasons work-up was postponed.  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   Treatment Plan: 1. Neoadjuvant chemotherapy with TCH Perjeta 6 cycles followed by Kadcyla maintenance (because of residual disease after neoadjuvant chemo) for 1 year completed 07/13/2021 2. left mastectomy 11/30/2020: Residual IDC 0.9 cm, focal high-grade DCIS, 5/12 lymph nodes are positive including 1 micromet, margins negative, ER 10%, PR 0%, HER2 3+, Ki-67 40% 3. Followed by adjuvant radiation therapy completed 03/02/2021 4.  Followed by antiestrogen therapy started 03/30/2021 5.  Followed by neratinib MRI liver 07/11/20: Hepatic cysts Bone scan and CT CAP: No mets Breast MRI 06/28/2020: 2 large left breast masses each 0.5 cm, overall 7 cm, third contiguous mass 3 cm (could be a lymph node, abutting pectoralis muscle), 4 lymph nodes left axilla ---------------------------------------------------------------------------------------------------------------------------- Treatment plan: letrozole started 03/30/2021, neratinib started August 17, 2021   Letrozole Toxicities: Mild hot flashes and weight gain   Neratinib  toxicities: Currently on 200 mg daily (5 tablets), because she is asymptomatic we will increase it to 6 tablets a day starting today.  Left chest wall subcutaneous nodule: I would like to request an ultrasound and a biopsy of this nodule.  We will monitoring her minimal residual disease with Signatera once neratinib is complete.   Return to clinic every 3 months for follow-up on neratinib    No orders of the defined types were placed in this encounter.  The patient has a good understanding of the overall plan. she agrees with it. she will call with any problems that may develop before the next visit here. Total time spent: 30 mins including face to face time and time spent for planning, charting and co-ordination of care   Harriette Ohara, MD 03/23/22

## 2022-03-23 ENCOUNTER — Other Ambulatory Visit: Payer: Self-pay

## 2022-03-23 ENCOUNTER — Inpatient Hospital Stay: Payer: 59 | Attending: Hematology and Oncology

## 2022-03-23 ENCOUNTER — Inpatient Hospital Stay (HOSPITAL_BASED_OUTPATIENT_CLINIC_OR_DEPARTMENT_OTHER): Payer: 59 | Admitting: Hematology and Oncology

## 2022-03-23 ENCOUNTER — Other Ambulatory Visit: Payer: Self-pay | Admitting: *Deleted

## 2022-03-23 VITALS — BP 123/88 | HR 80 | Temp 98.2°F | Wt 174.5 lb

## 2022-03-23 DIAGNOSIS — K7689 Other specified diseases of liver: Secondary | ICD-10-CM | POA: Insufficient documentation

## 2022-03-23 DIAGNOSIS — C50512 Malignant neoplasm of lower-outer quadrant of left female breast: Secondary | ICD-10-CM | POA: Diagnosis not present

## 2022-03-23 DIAGNOSIS — Z79811 Long term (current) use of aromatase inhibitors: Secondary | ICD-10-CM | POA: Diagnosis not present

## 2022-03-23 DIAGNOSIS — R222 Localized swelling, mass and lump, trunk: Secondary | ICD-10-CM | POA: Diagnosis not present

## 2022-03-23 DIAGNOSIS — Z170 Estrogen receptor positive status [ER+]: Secondary | ICD-10-CM | POA: Diagnosis not present

## 2022-03-23 DIAGNOSIS — Z9012 Acquired absence of left breast and nipple: Secondary | ICD-10-CM | POA: Insufficient documentation

## 2022-03-23 DIAGNOSIS — Z79899 Other long term (current) drug therapy: Secondary | ICD-10-CM | POA: Diagnosis not present

## 2022-03-23 LAB — CMP (CANCER CENTER ONLY)
ALT: 21 U/L (ref 0–44)
AST: 22 U/L (ref 15–41)
Albumin: 4.3 g/dL (ref 3.5–5.0)
Alkaline Phosphatase: 95 U/L (ref 38–126)
Anion gap: 3 — ABNORMAL LOW (ref 5–15)
BUN: 13 mg/dL (ref 6–20)
CO2: 31 mmol/L (ref 22–32)
Calcium: 9.8 mg/dL (ref 8.9–10.3)
Chloride: 105 mmol/L (ref 98–111)
Creatinine: 0.92 mg/dL (ref 0.44–1.00)
GFR, Estimated: >60 mL/min (ref >60)
Glucose, Bld: 76 mg/dL (ref 70–99)
Potassium: 5 mmol/L (ref 3.5–5.1)
Sodium: 139 mmol/L (ref 135–145)
Total Bilirubin: 0.4 mg/dL (ref 0.3–1.2)
Total Protein: 6.8 g/dL (ref 6.5–8.1)

## 2022-03-23 LAB — CBC WITH DIFFERENTIAL (CANCER CENTER ONLY)
Abs Immature Granulocytes: 0.03 10*3/uL (ref 0.00–0.07)
Basophils Absolute: 0.1 10*3/uL (ref 0.0–0.1)
Basophils Relative: 1 %
Eosinophils Absolute: 0.2 10*3/uL (ref 0.0–0.5)
Eosinophils Relative: 3 %
HCT: 39.6 % (ref 36.0–46.0)
Hemoglobin: 13.6 g/dL (ref 12.0–15.0)
Immature Granulocytes: 1 %
Lymphocytes Relative: 24 %
Lymphs Abs: 1.4 10*3/uL (ref 0.7–4.0)
MCH: 31.3 pg (ref 26.0–34.0)
MCHC: 34.3 g/dL (ref 30.0–36.0)
MCV: 91.2 fL (ref 80.0–100.0)
Monocytes Absolute: 0.5 10*3/uL (ref 0.1–1.0)
Monocytes Relative: 8 %
Neutro Abs: 3.8 10*3/uL (ref 1.7–7.7)
Neutrophils Relative %: 63 %
Platelet Count: 191 10*3/uL (ref 150–400)
RBC: 4.34 MIL/uL (ref 3.87–5.11)
RDW: 12.7 % (ref 11.5–15.5)
WBC Count: 6 10*3/uL (ref 4.0–10.5)
nRBC: 0 % (ref 0.0–0.2)

## 2022-03-23 NOTE — Assessment & Plan Note (Addendum)
06/23/2020: Normal mammogram September 2021.  December 2021: Following trauma to the breast she felt a lump but for variety of reasons work-up was postponed.  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   Treatment Plan: 1. Neoadjuvant chemotherapy with TCH Perjeta 6 cycles followed by Kadcyla maintenance (because of residual disease after neoadjuvant chemo) for 1 year completed 07/13/2021 2. left mastectomy 11/30/2020: Residual IDC 0.9 cm, focal high-grade DCIS, 5/12 lymph nodes are positive including 1 micromet, margins negative, ER 10%, PR 0%, HER2 3+, Ki-67 40% 3. Followed by adjuvant radiation therapy completed 03/02/2021 4.  Followed by antiestrogen therapy started 03/30/2021 5.  Followed by neratinib MRI liver 07/11/20: Hepatic cysts Bone scan and CT CAP: No mets Breast MRI 06/28/2020: 2 large left breast masses each 0.5 cm, overall 7 cm, third contiguous mass 3 cm (could be a lymph node, abutting pectoralis muscle), 4 lymph nodes left axilla ---------------------------------------------------------------------------------------------------------------------------- Treatment plan: letrozole started 03/30/2021, neratinib started August 17, 2021   Letrozole Toxicities: Mild hot flashes and weight gain   Neratinib toxicities: Currently on 200 mg daily (5 tablets), because she is asymptomatic we will increase it to 6 tablets a day starting today.  Left chest wall subcutaneous nodule: I would like to request an ultrasound and a biopsy of this nodule.  We will monitoring her minimal residual disease with Signatera once neratinib is complete.   Return to clinic every 3 months for follow-up on neratinib

## 2022-03-29 ENCOUNTER — Encounter: Payer: Self-pay | Admitting: Hematology and Oncology

## 2022-04-03 ENCOUNTER — Encounter: Payer: Self-pay | Admitting: Hematology and Oncology

## 2022-04-19 ENCOUNTER — Encounter: Payer: Self-pay | Admitting: Physician Assistant

## 2022-05-31 ENCOUNTER — Inpatient Hospital Stay: Payer: 59 | Attending: Hematology and Oncology

## 2022-05-31 ENCOUNTER — Inpatient Hospital Stay: Payer: 59 | Admitting: Pharmacist

## 2022-05-31 ENCOUNTER — Other Ambulatory Visit: Payer: Self-pay

## 2022-05-31 VITALS — BP 114/76 | HR 68 | Temp 97.3°F | Resp 20 | Wt 175.4 lb

## 2022-05-31 DIAGNOSIS — Z79899 Other long term (current) drug therapy: Secondary | ICD-10-CM | POA: Diagnosis not present

## 2022-05-31 DIAGNOSIS — Z79811 Long term (current) use of aromatase inhibitors: Secondary | ICD-10-CM | POA: Insufficient documentation

## 2022-05-31 DIAGNOSIS — C50512 Malignant neoplasm of lower-outer quadrant of left female breast: Secondary | ICD-10-CM

## 2022-05-31 LAB — CMP (CANCER CENTER ONLY)
ALT: 21 U/L (ref 0–44)
AST: 24 U/L (ref 15–41)
Albumin: 4.4 g/dL (ref 3.5–5.0)
Alkaline Phosphatase: 88 U/L (ref 38–126)
Anion gap: 5 (ref 5–15)
BUN: 15 mg/dL (ref 6–20)
CO2: 31 mmol/L (ref 22–32)
Calcium: 9.8 mg/dL (ref 8.9–10.3)
Chloride: 105 mmol/L (ref 98–111)
Creatinine: 0.93 mg/dL (ref 0.44–1.00)
GFR, Estimated: >60 mL/min (ref >60)
Glucose, Bld: 92 mg/dL (ref 70–99)
Potassium: 4.4 mmol/L (ref 3.5–5.1)
Sodium: 141 mmol/L (ref 135–145)
Total Bilirubin: 0.7 mg/dL (ref 0.3–1.2)
Total Protein: 7.5 g/dL (ref 6.5–8.1)

## 2022-05-31 LAB — CBC WITH DIFFERENTIAL (CANCER CENTER ONLY)
Abs Immature Granulocytes: 0.02 10*3/uL (ref 0.00–0.07)
Basophils Absolute: 0 10*3/uL (ref 0.0–0.1)
Basophils Relative: 1 %
Eosinophils Absolute: 0.1 10*3/uL (ref 0.0–0.5)
Eosinophils Relative: 2 %
HCT: 38.6 % (ref 36.0–46.0)
Hemoglobin: 13.3 g/dL (ref 12.0–15.0)
Immature Granulocytes: 0 %
Lymphocytes Relative: 26 %
Lymphs Abs: 1.3 10*3/uL (ref 0.7–4.0)
MCH: 31.2 pg (ref 26.0–34.0)
MCHC: 34.5 g/dL (ref 30.0–36.0)
MCV: 90.6 fL (ref 80.0–100.0)
Monocytes Absolute: 0.5 10*3/uL (ref 0.1–1.0)
Monocytes Relative: 9 %
Neutro Abs: 3.1 10*3/uL (ref 1.7–7.7)
Neutrophils Relative %: 62 %
Platelet Count: 199 10*3/uL (ref 150–400)
RBC: 4.26 MIL/uL (ref 3.87–5.11)
RDW: 12.8 % (ref 11.5–15.5)
WBC Count: 5 10*3/uL (ref 4.0–10.5)
nRBC: 0 % (ref 0.0–0.2)

## 2022-05-31 NOTE — Progress Notes (Signed)
[place]       Telephone: [phone]?Fax: [phone]   Oncology Clinical Pharmacist Practitioner Progress Note  Janet [name] was contacted via in-person visit to discuss her chemotherapy regimen for neratinib which they receive under the care of Dr. Nicholas Lose.    Current treatment regimen and start date Neratinib (08/17/21) Letrozole (03/30/21) -- discontinued 11/30/21 Anastrozole (11/30/21)   Interval History She continues on neratinib 6 tablets (240 mg) by mouth daily on days 1 to 28 of a 28-day cycle. This is being given in combination with anastrozole 1 mg by mouth daily. Therapy is planned to continue until 1 year of neratinib therapy in the extended adjuvant setting (08/18/22).  Janet [name] is here today as a follow-up to her neratinib.  She was last seen by Dr. Lindi Adie on 03/22/22 and clinical pharmacy on 02/01/23.  She restarted the 6 tablets on 01/15/22.    Response to Therapy Janet [name] continues to do well. Her diarrhea is well controlled with diet and she has only used loperamide twice in the last 3 months. A repeat U/S of the left breast has been ordered after speaking with Dr. Lindi Adie per radiology recommendations at her last scan on 03/27/22 for 3 month follow up. Dr. Lindi Adie will have a telephone visit with Janet [name] to review that scan which is planned with [facility] on 06/27/22 per patient's report.  She will have Signatera done sometime after she sees Dr. Lindi Adie in May when she finishes up neratinib. She will see clinical pharmacy as needed going forward. Labs, vitals, treatment parameters, and manufacturer guidelines assessing toxicity were reviewed with Janet [name] today. Based on these values, patient is in agreement to continue neratinib therapy at this time.  Allergies No Known Allergies  Vitals    05/31/2022    9:57 AM 03/23/2022    9:05 AM 01/31/2022    9:08 AM  Oncology Vitals  Height   175 cm  Weight 79.561 kg 79.153 kg 79.017 kg  Weight  (lbs) 175 lbs 6 oz 174 lbs 8 oz 174 lbs 3 oz  BMI 25.9 kg/m2   25.9 kg/m2 25.77 kg/m2   25.77 kg/m2 25.72 kg/m2   25.72 kg/m2  Temp 97.3 F (36.3 C) 98.2 F (36.8 C) 97.7 F (36.5 C)  Pulse Rate 68 80 74  BP 114/76 123/88 125/86  Resp 20  18  SpO2 100 % 100 % 100 %  BSA (m2) 1.97 m2   1.97 m2 1.96 m2   1.96 m2 1.96 m2   1.96 m2    Laboratory Data    Latest Ref Rng & Units 05/31/2022    9:10 AM 03/23/2022    8:52 AM 01/31/2022    8:15 AM  CBC EXTENDED  WBC 4.0 - 10.5 K/uL 5.0  6.0  5.6   RBC 3.87 - 5.11 MIL/uL 4.26  4.34  4.37   Hemoglobin 12.0 - 15.0 g/dL 13.3  13.6  13.8   HCT 36.0 - 46.0 % 38.6  39.6  40.9   Platelets 150 - 400 K/uL 199  191  185   NEUT# 1.7 - 7.7 K/uL 3.1  3.8  3.5   Lymph# 0.7 - 4.0 K/uL 1.3  1.4  1.4        Latest Ref Rng & Units 05/31/2022    9:10 AM 03/23/2022    8:52 AM 01/31/2022    8:15 AM  CMP  Glucose 70 - 99 mg/dL 92  76  103  BUN 6 - 20 mg/dL '15  13  17   '$ Creatinine 0.44 - 1.00 mg/dL 0.93  0.92  0.79   Sodium 135 - 145 mmol/L 141  139  143   Potassium 3.5 - 5.1 mmol/L 4.4  5.0  4.6   Chloride 98 - 111 mmol/L 105  105  108   CO2 22 - 32 mmol/L '31  31  31   '$ Calcium 8.9 - 10.3 mg/dL 9.8  9.8  9.6   Total Protein 6.5 - 8.1 g/dL 7.5  6.8  7.6   Total Bilirubin 0.3 - 1.2 mg/dL 0.7  0.4  0.5   Alkaline Phos 38 - 126 U/L 88  95  85   AST 15 - 41 U/L '24  22  24   '$ ALT 0 - 44 U/L '21  21  22     '$ Adverse Effects Assessment Diarrhea: well controlled on loperamide and diet  Adherence Assessment Janet [name] reports missing 0 doses over the past 8 weeks.   Reason for missed dose: n/a Patient was re-educated on importance of adherence.   Access Assessment Janet [name] is currently receiving her neratinib through [employer] concerns:  none  Medication Reconciliation The patient's medication list was reviewed today with the patient? Yes New medications or herbal supplements have recently been started? No  Any  medications have been discontinued? No  The medication list was updated and reconciled based on the patient's most recent medication list in the electronic medical record (EMR) including herbal products and OTC medications.   Medications Current Outpatient Medications  Medication Sig Dispense Refill   anastrozole (ARIMIDEX) 1 MG tablet Take 1 tablet (1 mg total) by mouth daily. 90 tablet 3   Calcium Carb-Cholecalciferol (CALCIUM 500+D3) 500-10 MG-MCG TABS Take 1 tablet by mouth daily in the afternoon. 90 tablet 3   ciclopirox (PENLAC) 8 % solution Apply topically at bedtime. Apply over nail and surrounding skin. Apply daily over previous coat. After seven (7) days, may remove with alcohol and continue cycle. 6.6 mL 0   Neratinib Maleate (NERLYNX) 40 MG tablet TAKE 6 TABLETS ('240MG'$ ) BY MOUTH  ONCE DAILY WITH FOOD 168 tablet 5   No current facility-administered medications for this visit.    Drug-Drug Interactions (DDIs) DDIs were evaluated? Yes Significant DDIs? No  The patient was instructed to speak with their health care provider and/or the oral chemotherapy pharmacist before starting any new drug, including prescription or over the counter, natural / herbal products, or vitamins.  Supportive Care Continue using diet for diarrhea and loperamide as needed  Dosing Assessment Hepatic adjustments needed? No  Renal adjustments needed? No  Toxicity adjustments needed? No  The current dosing regimen is appropriate to continue at this time.  Follow-Up Plan Continue neratinib 6 tablets (240 mg) by mouth daily with food Continue anastrozole 1 mg by mouth daily Use loperamide as needed for diarrhea U/S ordered left breast per Dr. Geralyn Flash instructions and radiology recs from last U/S on 03/27/22. Will have telephone visit to discuss with Dr. Lindi Adie. Labs, Dr. Lindi Adie visit, on 08/17/22.  This will be her last day on neratinib in the extended adjuvant setting. Started on 08/17/21. Signatera to be  ordered by Dr. Lindi Adie sometime after finishing neratinib Labs, pharmacy clinic visit, on 05/30/21 She can see clinical pharmacy as needed going forward.  Janet [name] participated in the discussion, expressed understanding, and voiced agreement with the above plan. All questions were answered to her  satisfaction. The patient was advised to contact the clinic at [phone] with any questions or concerns prior to her return visit.   I spent 30 minutes assessing and educating the patient.  Raina Mina, RPH-CPP, 05/31/2022  10:43 AM   **Disclaimer: This note was dictated with voice recognition software. Similar sounding words can inadvertently be transcribed and this note may contain transcription errors which may not have been corrected upon publication of note.**

## 2022-06-29 ENCOUNTER — Encounter: Payer: Self-pay | Admitting: Hematology and Oncology

## 2022-07-04 MED ORDER — NERATINIB MALEATE 40 MG PO TABS: ORAL_TABLET | ORAL | 0 refills | Status: DC

## 2022-07-06 ENCOUNTER — Other Ambulatory Visit (HOSPITAL_COMMUNITY): Payer: Self-pay

## 2022-07-06 ENCOUNTER — Encounter: Payer: Self-pay | Admitting: Hematology and Oncology

## 2022-07-06 ENCOUNTER — Telehealth: Payer: Self-pay | Admitting: Pharmacy Technician

## 2022-07-06 MED ORDER — NERATINIB MALEATE 40 MG PO TABS: ORAL_TABLET | ORAL | 0 refills | Status: DC

## 2022-07-06 NOTE — Telephone Encounter (Signed)
Oral Oncology Patient Advocate Encounter   Received notification that prior authorization for Nerlynx is due for renewal.   PA submitted on 07/06/22 Submitted via phone to [facility] [phone] Chart notes sent via e-fax to [phone] Case ID: [id number] Status is pending     Jinger Neighbors, CPhT-Adv Oncology Pharmacy Patient Advocate [facility] Direct Number: [phone]  Fax: [phone]

## 2022-07-09 NOTE — Telephone Encounter (Signed)
Oral Oncology Patient Advocate Encounter  Prior Authorization for Nerlynx has been approved.    PA# [id number] Effective dates: 07/06/22 through 07/06/23  Patient must continue to fill at [facility].    Jinger Neighbors, CPhT-Adv Oncology Pharmacy Patient Advocate [facility] Direct Number: [phone]  Fax: [phone]

## 2022-07-18 ENCOUNTER — Other Ambulatory Visit: Payer: Self-pay | Admitting: Hematology and Oncology

## 2022-08-12 NOTE — Progress Notes (Signed)
Patient Care Team: Hal Morales, NP as PCP - General (Nurse Practitioner) Donnelly Angelica, RN as Oncology Nurse Navigator Pershing Proud, RN as Oncology Nurse Navigator Serena Croissant, MD as Medical Oncologist (Hematology and Oncology) Anselm Lis, RPH-CPP as Pharmacist (Hematology and Oncology)  DIAGNOSIS:  Encounter Diagnosis  Name Primary?   Malignant neoplasm of lower-outer quadrant of left breast of female, estrogen receptor positive ([facility]) Yes    SUMMARY OF ONCOLOGIC HISTORY: Oncology History  Malignant neoplasm of lower-outer quadrant of left breast of female, estrogen receptor positive ([facility])  06/23/2020 Initial Diagnosis   Normal mammogram September 2021.  December 2021: Following trauma to the breast she felt a lump but for variety of reasons work-up was postponed.  Mammogram revealed 2 tumors measuring 7 cm individually 4 cm and 3 cm, 3 axillary lymph nodes, biopsy revealed grade 3 IDC ER 10%, PR 0%, Ki-67 40%, HER-2 +3+ by IHC, lymph node also positive   06/28/2020 Cancer Staging   Staging form: Breast, AJCC 8th Edition - Clinical stage from 06/28/2020: Stage IIB (cT2, cN1, cM0, G3, ER+, PR-, HER2+) - Signed by Serena Croissant, MD on 06/28/2020 Stage prefix: Initial diagnosis Histologic grading system: 3 grade system   07/13/2020 - 11/16/2020 Chemotherapy   TCH Perjeta   11/30/2020 Surgery   left mastectomy: Residual IDC 0.9 cm, focal high-grade DCIS, 5/12 lymph nodes are positive including 1 micromet, margins negative, ER 10%, PR 0%, HER2 3+, Ki-67 40%   12/15/2020 - 07/13/2021 Chemotherapy   Patient is on Treatment Plan : BREAST ADO-Trastuzumab Emtansine (Kadcyla) q21d     12/26/2020 Cancer Staging   Staging form: Breast, AJCC 8th Edition - Pathologic stage from 12/26/2020: No Stage Recommended (ypT1c, pN2a, cM0, G3, ER+, PR-, HER2+) - Signed by Lonie Peak, MD on 12/26/2020 Stage prefix: Post-therapy Histologic grading system: 3 grade system     CHIEF  COMPLIANT: Follow-up on breast cancer on anastrozole and Neratinib   INTERVAL HISTORY: Janet [name] is a 57 y.o. with above-mentioned history of HER2 positive breast cancer on letrozole and Neratinib. She presents to the clinic today for a follow-up. She is tolerating anastrozole extremely well.  She has noticed a palpable lump in the left chest wall which has been evaluated by ultrasound and a second ultrasound is being planned for July.  Her annual mammograms will be done in September.   ALLERGIES:  has No Known Allergies.  MEDICATIONS:  Current Outpatient Medications  Medication Sig Dispense Refill   anastrozole (ARIMIDEX) 1 MG tablet Take 1 tablet (1 mg total) by mouth daily. 90 tablet 3   Calcium Carb-Cholecalciferol (CALCIUM 500+D3) 500-10 MG-MCG TABS Take 1 tablet by mouth daily in the afternoon. 90 tablet 3   ciclopirox (PENLAC) 8 % solution Apply topically at bedtime. Apply over nail and surrounding skin. Apply daily over previous coat. After seven (7) days, may remove with alcohol and continue cycle. 6.6 mL 0   Neratinib Maleate (NERLYNX) 40 MG tablet TAKE 6 TABLETS (240MG ) BY MOUTH  ONCE DAILY WITH FOOD 62 tablet 0   phenazopyridine (PYRIDIUM) 200 MG tablet as needed for nausea     No current facility-administered medications for this visit.    PHYSICAL EXAMINATION: ECOG PERFORMANCE STATUS: 1 - Symptomatic but completely ambulatory  Vitals:   08/17/22 0806  BP: 117/66  Pulse: 82  Resp: 18  Temp: 97.8 F (36.6 C)  SpO2: 100%   Filed Weights   08/17/22 0806  Weight: 177 lb (80.3 kg)  LABORATORY DATA:  I have reviewed the data as listed    Latest Ref Rng & Units 05/31/2022    9:10 AM 03/23/2022    8:52 AM 01/31/2022    8:15 AM  CMP  Glucose 70 - 99 mg/dL 92  76  161   BUN 6 - 20 mg/dL 15  13  17    Creatinine 0.44 - 1.00 mg/dL 0.96  0.45  4.09   Sodium 135 - 145 mmol/L 141  139  143   Potassium 3.5 - 5.1 mmol/L 4.4  5.0  4.6   Chloride 98 - 111 mmol/L 105   105  108   CO2 22 - 32 mmol/L 31  31  31    Calcium 8.9 - 10.3 mg/dL 9.8  9.8  9.6   Total Protein 6.5 - 8.1 g/dL 7.5  6.8  7.6   Total Bilirubin 0.3 - 1.2 mg/dL 0.7  0.4  0.5   Alkaline Phos 38 - 126 U/L 88  95  85   AST 15 - 41 U/L 24  22  24    ALT 0 - 44 U/L 21  21  22      Lab Results  Component Value Date   WBC 5.1 08/17/2022   HGB 13.4 08/17/2022   HCT 40.8 08/17/2022   MCV 91.3 08/17/2022   PLT 196 08/17/2022   NEUTROABS 3.1 08/17/2022    ASSESSMENT & PLAN:  Malignant neoplasm of lower-outer quadrant of left breast of female, estrogen receptor positive ([facility]) 06/23/2020: Normal mammogram September 2021.  December 2021: Following trauma to the breast she felt a lump but for variety of reasons work-up was postponed.  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   Treatment Plan: 1. Neoadjuvant chemotherapy with TCH Perjeta 6 cycles followed by Kadcyla maintenance (because of residual disease after neoadjuvant chemo) for 1 year completed 07/13/2021 2. left mastectomy 11/30/2020: Residual IDC 0.9 cm, focal high-grade DCIS, 5/12 lymph nodes are positive including 1 micromet, margins negative, ER 10%, PR 0%, HER2 3+, Ki-67 40% 3. Followed by adjuvant radiation therapy completed 03/02/2021 4.  Followed by antiestrogen therapy started 03/30/2021 5.  Followed by neratinib MRI liver 07/11/20: Hepatic cysts Bone scan and CT CAP: No mets Breast MRI 06/28/2020: 2 large left breast masses each 0.5 cm, overall 7 cm, third contiguous mass 3 cm (could be a lymph node, abutting pectoralis muscle), 4 lymph nodes left axilla ---------------------------------------------------------------------------------------------------------------------------- Treatment plan: letrozole started 03/30/2021, neratinib started August 17, 2021   Letrozole Toxicities: Mild hot  flashes and weight gain   Neratinib toxicities: Tolerate it fairly well with minimal diarrhea.   This concludes her treatment with neratinib.   Left chest wall subcutaneous nodule: Ultrasound [facility] 06/27/2022: Probably benign.  (Another ultrasound in July as planned)   We will monitoring her minimal residual disease with Signatera.  Return to clinic in 1 year for follow-up      No orders of the defined types were placed in this encounter.  The patient has a good understanding of the overall plan. she agrees with it. she will call with any problems that may develop before the next visit here. Total time spent: 30 mins including face to face time and time spent for planning, charting and co-ordination of care   Tamsen Meek, MD 08/17/22    I Janan Ridge  am acting as a Neurosurgeon for Dr.Kahleel Fadeley  I have reviewed the above documentation for accuracy and completeness, and I agree with the above.

## 2022-08-17 ENCOUNTER — Inpatient Hospital Stay: Payer: 59 | Attending: Hematology and Oncology

## 2022-08-17 ENCOUNTER — Inpatient Hospital Stay (HOSPITAL_BASED_OUTPATIENT_CLINIC_OR_DEPARTMENT_OTHER): Payer: 59 | Admitting: Hematology and Oncology

## 2022-08-17 ENCOUNTER — Encounter: Payer: Self-pay | Admitting: Hematology and Oncology

## 2022-08-17 ENCOUNTER — Other Ambulatory Visit: Payer: Self-pay

## 2022-08-17 VITALS — BP 117/66 | HR 82 | Temp 97.8°F | Resp 18 | Ht 69.0 in | Wt 177.0 lb

## 2022-08-17 DIAGNOSIS — Z9221 Personal history of antineoplastic chemotherapy: Secondary | ICD-10-CM | POA: Diagnosis not present

## 2022-08-17 DIAGNOSIS — Z170 Estrogen receptor positive status [ER+]: Secondary | ICD-10-CM | POA: Insufficient documentation

## 2022-08-17 DIAGNOSIS — R222 Localized swelling, mass and lump, trunk: Secondary | ICD-10-CM | POA: Diagnosis not present

## 2022-08-17 DIAGNOSIS — Z79811 Long term (current) use of aromatase inhibitors: Secondary | ICD-10-CM | POA: Insufficient documentation

## 2022-08-17 DIAGNOSIS — C50512 Malignant neoplasm of lower-outer quadrant of left female breast: Secondary | ICD-10-CM

## 2022-08-17 DIAGNOSIS — Z9012 Acquired absence of left breast and nipple: Secondary | ICD-10-CM | POA: Diagnosis not present

## 2022-08-17 DIAGNOSIS — K7689 Other specified diseases of liver: Secondary | ICD-10-CM | POA: Diagnosis not present

## 2022-08-17 DIAGNOSIS — Z923 Personal history of irradiation: Secondary | ICD-10-CM | POA: Diagnosis not present

## 2022-08-17 LAB — CMP (CANCER CENTER ONLY)
ALT: 13 U/L (ref 0–44)
AST: 16 U/L (ref 15–41)
Albumin: 4.4 g/dL (ref 3.5–5.0)
Alkaline Phosphatase: 85 U/L (ref 38–126)
Anion gap: 7 (ref 5–15)
BUN: 20 mg/dL (ref 6–20)
CO2: 31 mmol/L (ref 22–32)
Calcium: 10.1 mg/dL (ref 8.9–10.3)
Chloride: 108 mmol/L (ref 98–111)
Creatinine: 0.97 mg/dL (ref 0.44–1.00)
GFR, Estimated: >60 mL/min (ref >60)
Glucose, Bld: 108 mg/dL — ABNORMAL HIGH (ref 70–99)
Potassium: 5 mmol/L (ref 3.5–5.1)
Sodium: 146 mmol/L — ABNORMAL HIGH (ref 135–145)
Total Bilirubin: 0.7 mg/dL (ref 0.3–1.2)
Total Protein: 7.3 g/dL (ref 6.5–8.1)

## 2022-08-17 LAB — CBC WITH DIFFERENTIAL (CANCER CENTER ONLY)
Abs Immature Granulocytes: 0.01 10*3/uL (ref 0.00–0.07)
Basophils Absolute: 0 10*3/uL (ref 0.0–0.1)
Basophils Relative: 1 %
Eosinophils Absolute: 0.2 10*3/uL (ref 0.0–0.5)
Eosinophils Relative: 3 %
HCT: 40.8 % (ref 36.0–46.0)
Hemoglobin: 13.4 g/dL (ref 12.0–15.0)
Immature Granulocytes: 0 %
Lymphocytes Relative: 27 %
Lymphs Abs: 1.4 10*3/uL (ref 0.7–4.0)
MCH: 30 pg (ref 26.0–34.0)
MCHC: 32.8 g/dL (ref 30.0–36.0)
MCV: 91.3 fL (ref 80.0–100.0)
Monocytes Absolute: 0.5 10*3/uL (ref 0.1–1.0)
Monocytes Relative: 9 %
Neutro Abs: 3.1 10*3/uL (ref 1.7–7.7)
Neutrophils Relative %: 60 %
Platelet Count: 196 10*3/uL (ref 150–400)
RBC: 4.47 MIL/uL (ref 3.87–5.11)
RDW: 12.8 % (ref 11.5–15.5)
WBC Count: 5.1 10*3/uL (ref 4.0–10.5)
nRBC: 0 % (ref 0.0–0.2)

## 2022-08-17 NOTE — Assessment & Plan Note (Addendum)
06/23/2020: Normal mammogram September 2021.  December 2021: Following trauma to the breast she felt a lump but for variety of reasons work-up was postponed.  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   Treatment Plan: 1. Neoadjuvant chemotherapy with [facility] Perjeta 6 cycles followed by Kadcyla maintenance (because of residual disease after neoadjuvant chemo) for 1 year completed 07/13/2021 2. left mastectomy 11/30/2020: Residual IDC 0.9 cm, focal high-grade DCIS, 5/12 lymph nodes are positive including 1 micromet, margins negative, ER 10%, PR 0%, HER2 3+, Ki-67 40% 3. Followed by adjuvant radiation therapy completed 03/02/2021 4.  Followed by antiestrogen therapy started 03/30/2021 5.  Followed by neratinib MRI liver 07/11/20: Hepatic cysts Bone scan and CT CAP: No mets Breast MRI 06/28/2020: 2 large left breast masses each 0.5 cm, overall 7 cm, third contiguous mass 3 cm (could be a lymph node, abutting pectoralis muscle), 4 lymph nodes left axilla ---------------------------------------------------------------------------------------------------------------------------- Treatment plan: letrozole started 03/30/2021, neratinib started August 17, 2021   Letrozole Toxicities: Mild hot flashes and weight gain   Neratinib toxicities: Tolerate it fairly well with minimal diarrhea.   This concludes her treatment with neratinib.   Left chest wall subcutaneous nodule: Ultrasound [facility] 06/27/2022: Probably benign.  (Another ultrasound in July as planned)   We will monitoring her minimal residual disease with Signatera.  Return to clinic in 1 year for follow-up

## 2022-08-20 ENCOUNTER — Encounter: Payer: Self-pay | Admitting: Hematology and Oncology

## 2022-08-20 ENCOUNTER — Telehealth: Payer: Self-pay | Admitting: Hematology and Oncology

## 2022-08-20 NOTE — Telephone Encounter (Signed)
Scheduled appointment per 5/31 los. Patient is aware of the made appointment. 

## 2022-08-20 NOTE — Telephone Encounter (Signed)
Per md orders entered for signatera. Requisition and all supported documents faxed to [phone]. Faxed confirmation was received.  

## 2022-08-23 IMAGING — MR MR BREAST BILAT WO/W CM
8 of 12 series · 30 of 48 positions shown · IV contrast (7ML GADAVIST)
Comparison: No previous breast MRI.

CLINICAL DATA: Newly diagnosed LEFT breast cancer.

LABS:  Not performed at [REDACTED]
EXAM:
BILATERAL BREAST MRI WITH AND WITHOUT CONTRAST
TECHNIQUE: Multiplanar, multisequence MR images of both breasts were obtained
prior to and following the intravenous administration of 7 ml of
Gadavist

[Series 2: t2_tirm_tra ipat (a-p) · axial · 3.0mm · 0.70mm/px · 1 of 55 slices shown]
[im 1/55]
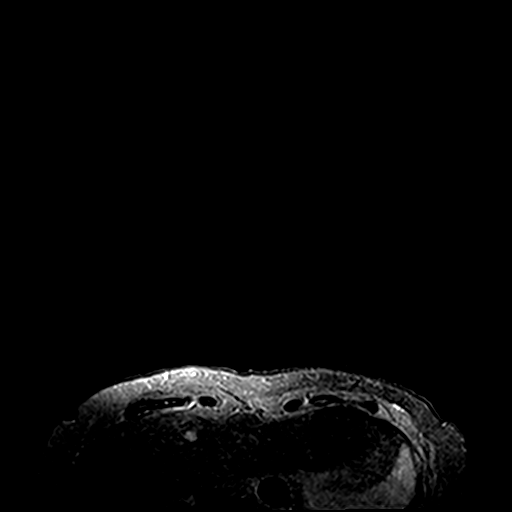

[Series 3: fl3d pre-cm no · axial · non-contrast · 1.2mm · 0.94mm/px · z∈[-88,+84]mm · 5 of 144 slices shown]
[im 1/144]
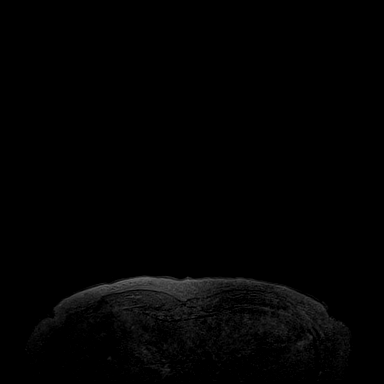
[im 36/144]
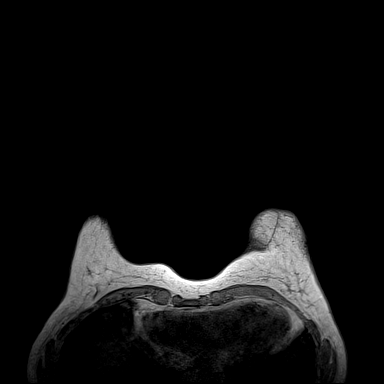
[im 72/144]
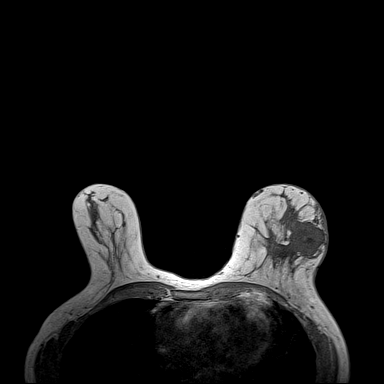
[im 108/144]
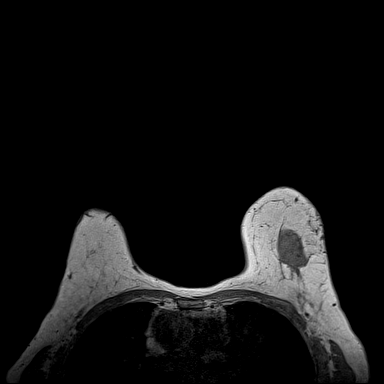
[im 144/144]
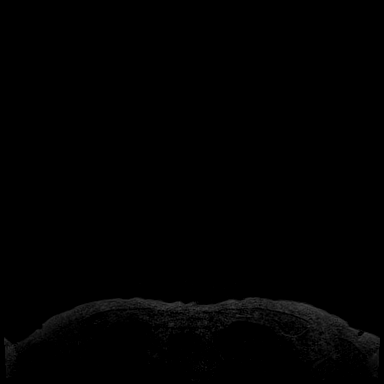

[Series 6: fl3d pre-cm · axial · non-contrast · 1.2mm · 0.94mm/px · z∈[-88,+84]mm · 5 of 144 slices shown]
[im 1/144]
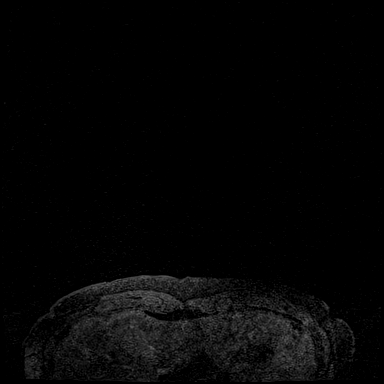
[im 36/144]
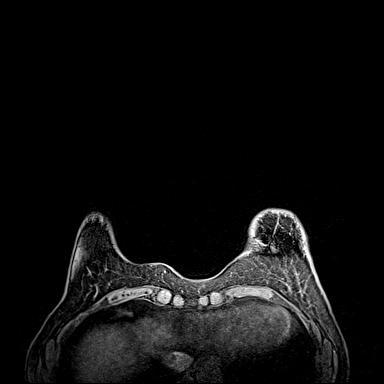
[im 72/144]
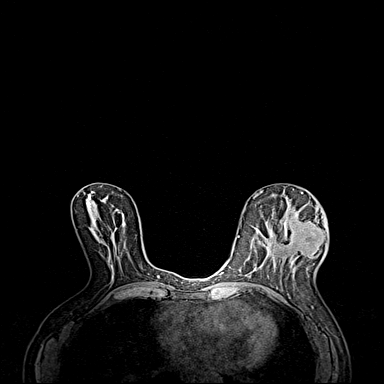
[im 108/144]
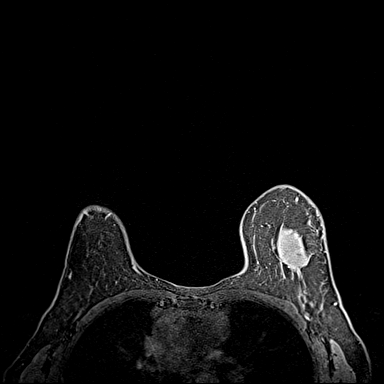
[im 144/144]
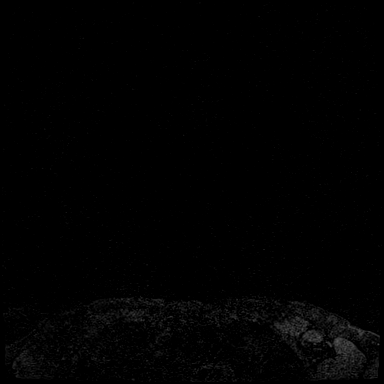

[Series 7: fl3d post immediate · axial · 1.2mm · 0.94mm/px · z∈[-88,+84]mm · 5 of 144 slices shown (1 of 3)]
[im 1/144]
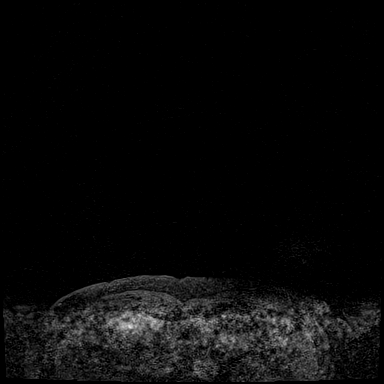
[im 36/144]
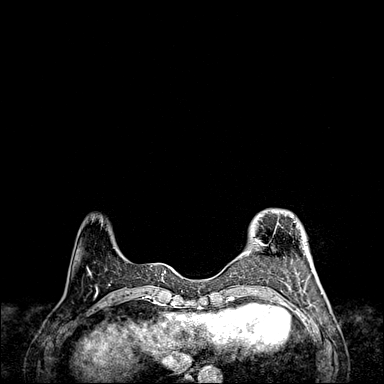
[im 72/144]
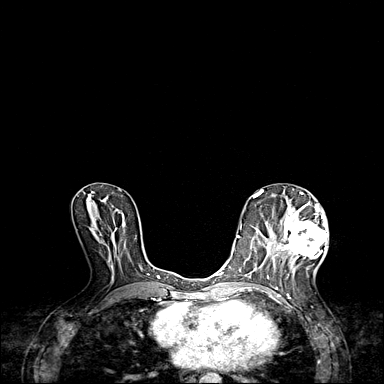
[im 108/144]
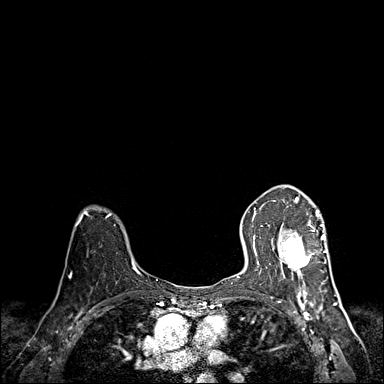
[im 144/144]
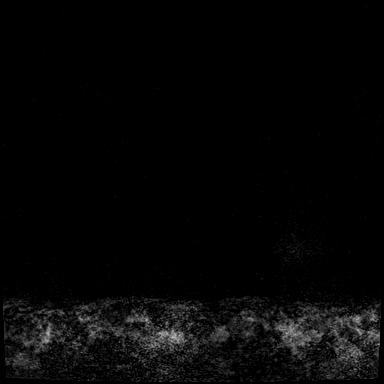

[Series 8: fl3d post immediate · axial · 1.2mm · 0.94mm/px · z∈[-88,+84]mm · 5 of 144 slices shown (2 of 3)]
[im 1/144]
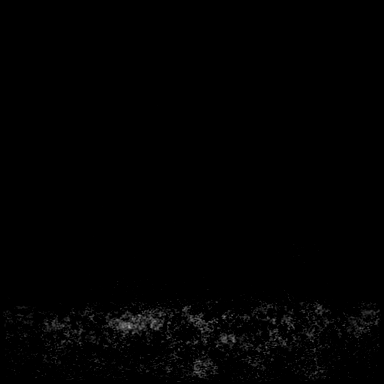
[im 36/144]
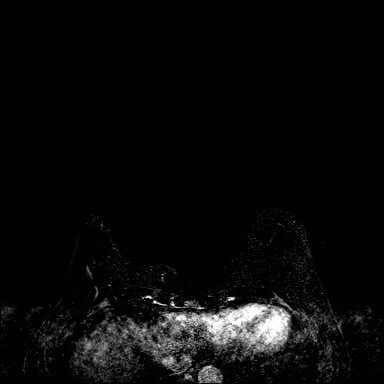
[im 72/144]
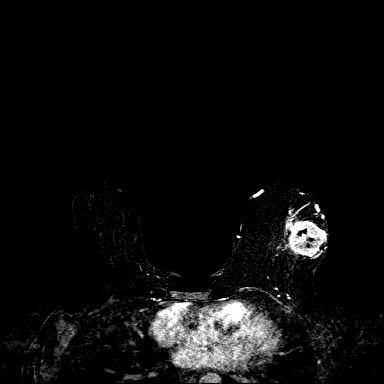
[im 108/144]
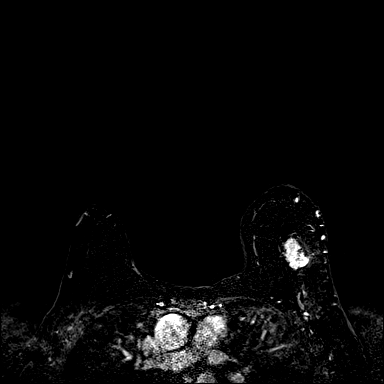
[im 144/144]
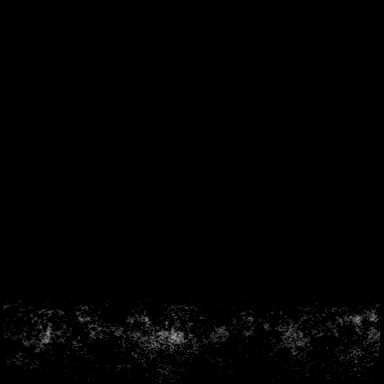

[Series 9: fl3d post immediate · axial · 172.8mm · 0.94mm/px · 1 of 1 slices shown (3 of 3)]
[im 1/1]
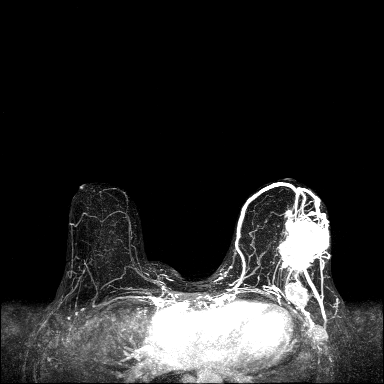

[Series 10: fl3d post 3min · axial · 1.2mm · 0.94mm/px · z∈[-88,+84]mm · 6 of 144 slices shown]
[im 1/144]
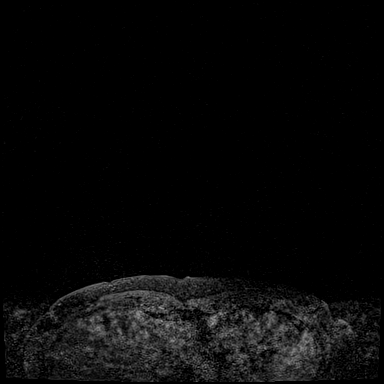
[im 29/144]
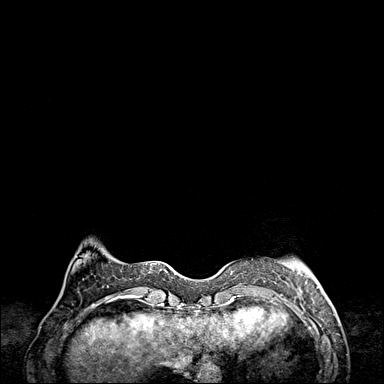
[im 58/144]
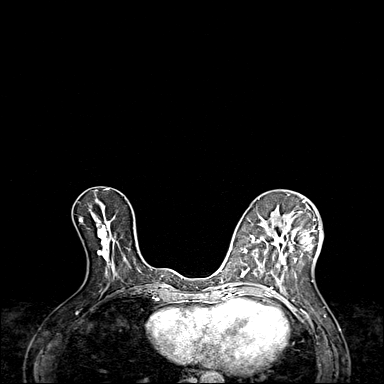
[im 86/144]
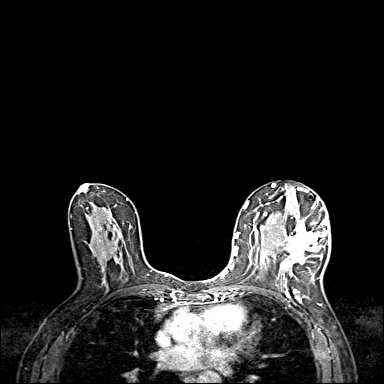
[im 115/144]
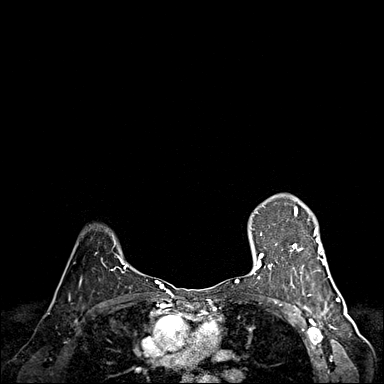
[im 144/144]
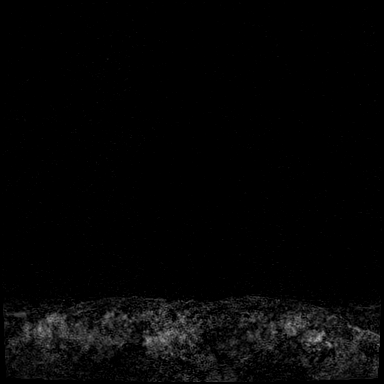

[Series 11: fl3d post 3min_sub · axial · 1.2mm · 0.94mm/px · z∈[-88,-54]mm · 2 of 144 slices shown]
[im 1/144]
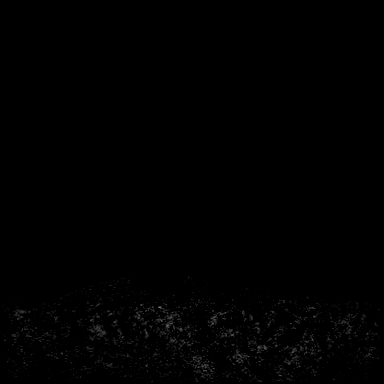
[im 29/144]
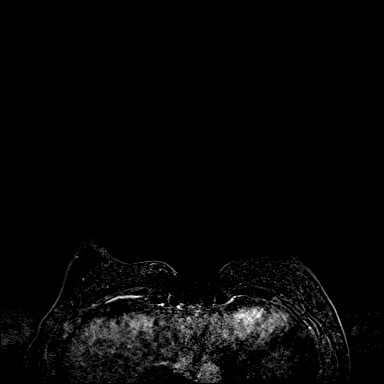

[30 of 48 positions shown; findings below may reference images not displayed]

Three-dimensional MR images were rendered by post-processing of the
original MR data on an independent workstation. The
three-dimensional MR images were interpreted, and findings are
reported in the following complete MRI report for this study. Three
dimensional images were evaluated at the independent interpreting
workstation using the DynaCAD thin client.
Comparison is made to outside
diagnostic mammogram and ultrasound dated 06/22/2020 and
postprocedural diagnostic mammogram dated 06/23/2020.
Ultrasound-guided biopsy images are not available.
FINDINGS: Breast composition: c. Heterogeneous fibroglandular tissue.

Background parenchymal enhancement: Mild

Right breast: No mass or abnormal enhancement.

Left breast: Contiguous irregular enhancing masses within the
upper-outer quadrant and lower outer quadrant of the LEFT breast,
both at middle to posterior depth, measuring 3.4 x 2.8 x 3.3 cm and
3.6 x 3.3 x 3.6 cm respectively, both with multiple biopsy clip
artifacts, corresponding to given history of the recent
biopsy-proven carcinoma.

The mass within the lower outer quadrant abuts the skin (series 8,

There is a third irregular enhancing mass, located within the
upper-outer quadrant of the LEFT breast, at far posterior depth,
nearly contiguous with the more anterior mass, measuring 2.9 x 2.1 x
3.0 cm, also containing a biopsy clip, presumably an additional site
of biopsy-proven carcinoma. This mass abuts the underlying
pectoralis muscle but there is no deeper enhancement to suggest
intramuscular or chest wall invasion.

Numerous prominent blood vessels are seen throughout the LEFT
breast, lateral slightly greater than medial, compatible with
associated neovascularity.

Lymph nodes: There are at least 4 enlarged/morphologically abnormal
lymph nodes within the LEFT axilla.

No enlarged or morphologically abnormal lymph nodes are seen within
the RIGHT axilla or within the internal mammary chain regions.

Ancillary findings: Several small T2 hyperintense lesions within the
visualized portion of the upper liver.
IMPRESSION: 1. Two large contiguous irregular enhancing masses within the LEFT
breast, involving both the upper outer quadrant and lower outer
quadrant of the LEFT breast, each measuring approximately 3.5 cm
greatest dimension, overall measuring greater than 7 cm craniocaudal
dimension, both with biopsy clip artifacts, corresponding to the
given history of recent biopsy-proven carcinoma.
2. Third nearly contiguous irregular enhancing mass within the
upper-outer quadrant of the LEFT breast, at far posterior depth
(axillary tail region), measuring 3 cm greatest dimension, also
containing a biopsy clip and presumably an additional site of
biopsy-proven breast malignancy versus lymph node metastasis. This
mass/lymph node abuts the underlying pectoralis muscle but there is
no deeper enhancement to suggest intramuscular or chest wall
invasion.
3. At least 4 additional enlarged/morphologically abnormal lymph
nodes within the LEFT axilla, almost certainly lymph node
metastases.
4. No evidence of malignancy within the RIGHT breast.
5. Several small T2 hyperintense lesions within the upper liver,
possibly cysts but too small to definitively characterize. Recommend
liver MRI for further characterization.

RECOMMENDATION:
1. Per treatment plan for patient's known multicentric LEFT breast
carcinomas.
2. Liver MRI with contrast to exclude hepatic metastases.

BI-RADS CATEGORY  6: Known biopsy-proven malignancy.

## 2022-09-26 ENCOUNTER — Telehealth: Payer: Self-pay

## 2022-09-26 NOTE — Telephone Encounter (Signed)
Called pt per MD to advise Signatera testing was negative/not detected. Pt verbalized understanding of results and knows [facility] will be in touch to schedule 3 mo repeat lab.   

## 2022-09-28 ENCOUNTER — Encounter: Payer: Self-pay | Admitting: Hematology and Oncology

## 2022-10-19 ENCOUNTER — Other Ambulatory Visit: Payer: Self-pay | Admitting: Hematology and Oncology

## 2022-12-05 ENCOUNTER — Telehealth: Payer: Self-pay

## 2022-12-05 NOTE — Telephone Encounter (Signed)
Called pt per MD to advise Signatera testing was negative/not detected. Pt verbalized understanding of results and knows [facility] will be in touch to schedule 3 mo repeat lab.

## 2022-12-09 ENCOUNTER — Encounter: Payer: Self-pay | Admitting: Hematology and Oncology

## 2023-02-07 ENCOUNTER — Encounter: Payer: Self-pay | Admitting: Hematology and Oncology

## 2023-02-07 NOTE — Telephone Encounter (Signed)
Telephone call  

## 2023-02-19 ENCOUNTER — Encounter: Payer: Self-pay | Admitting: Hematology and Oncology

## 2023-02-22 ENCOUNTER — Encounter: Payer: Self-pay | Admitting: Hematology and Oncology

## 2023-03-15 ENCOUNTER — Telehealth: Payer: Self-pay

## 2023-03-15 NOTE — Telephone Encounter (Signed)
Called pt per MD to advise Signatera testing was negative/not detected. Pt verbalized understanding of results and knows [facility] will be in touch to schedule 3 mo repeat lab.   

## 2023-03-18 ENCOUNTER — Encounter: Payer: Self-pay | Admitting: Hematology and Oncology

## 2023-04-03 ENCOUNTER — Encounter: Payer: Self-pay | Admitting: Hematology and Oncology

## 2023-05-26 LAB — SIGNATERA
SIGNATERA MTM READOUT: 0 MTM/ml
SIGNATERA TEST RESULT: NEGATIVE

## 2023-05-31 ENCOUNTER — Telehealth: Payer: Self-pay

## 2023-05-31 NOTE — Telephone Encounter (Signed)
Called pt per MD to advise Signatera testing was negative/not detected. Pt verbalized understanding of results and knows [facility] will be in touch to schedule 3 mo repeat lab.   

## 2023-07-10 ENCOUNTER — Encounter: Payer: Self-pay | Admitting: Hematology and Oncology

## 2023-08-19 ENCOUNTER — Inpatient Hospital Stay: Payer: 59 | Attending: Hematology and Oncology | Admitting: Hematology and Oncology

## 2023-08-19 VITALS — BP 122/84 | HR 72 | Temp 97.9°F | Resp 18 | Wt 188.2 lb

## 2023-08-19 DIAGNOSIS — C50512 Malignant neoplasm of lower-outer quadrant of left female breast: Secondary | ICD-10-CM | POA: Insufficient documentation

## 2023-08-19 DIAGNOSIS — N841 Polyp of cervix uteri: Secondary | ICD-10-CM | POA: Insufficient documentation

## 2023-08-19 DIAGNOSIS — Z170 Estrogen receptor positive status [ER+]: Secondary | ICD-10-CM | POA: Insufficient documentation

## 2023-08-19 DIAGNOSIS — Z780 Asymptomatic menopausal state: Secondary | ICD-10-CM | POA: Diagnosis not present

## 2023-08-19 DIAGNOSIS — Z9012 Acquired absence of left breast and nipple: Secondary | ICD-10-CM | POA: Insufficient documentation

## 2023-08-19 DIAGNOSIS — Z79811 Long term (current) use of aromatase inhibitors: Secondary | ICD-10-CM | POA: Diagnosis not present

## 2023-08-19 MED ORDER — ANASTROZOLE 1 MG PO TABS: 1.0000 mg | ORAL_TABLET | Freq: Every day | ORAL | 3 refills | Status: AC

## 2023-08-19 NOTE — Assessment & Plan Note (Signed)
 06/23/2020: Normal mammogram September 2021.  December 2021: Following trauma to the breast she felt a lump but for variety of reasons work-up was postponed.  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   Treatment Plan: 1. Neoadjuvant chemotherapy with [facility] Perjeta  6 cycles followed by Kadcyla  maintenance (because of residual disease after neoadjuvant chemo) for 1 year completed 07/13/2021 2. left mastectomy 11/30/2020: Residual IDC 0.9 cm, focal high-grade DCIS, 5/12 lymph nodes are positive including 1 micromet, margins negative, ER 10%, PR 0%, HER2 3+, Ki-67 40% 3. Followed by adjuvant radiation therapy completed 03/02/2021 4.  Followed by antiestrogen therapy started 03/30/2021 5.  Followed by neratinib  June 2023- June 2024 MRI liver 07/11/20: Hepatic cysts Bone scan and CT CAP: No mets Breast MRI 06/28/2020: 2 large left breast masses each 0.5 cm, overall 7 cm, third contiguous mass 3 cm (could be a lymph node, abutting pectoralis muscle), 4 lymph nodes left axilla ---------------------------------------------------------------------------------------------------------------------------- Current treatment: letrozole  started 03/30/2021    Letrozole  Toxicities: Mild hot flashes and weight gain Breast cancer surveillance: Left chest wall subcutaneous nodule: Ultrasound [facility] 06/27/2022: Probably benign.  (Another ultrasound in July as planned) Mammogram and ultrasound 03/27/2023: Benign breast density category C Signatera testing: Negative    Return to clinic in 1 year for follow-up

## 2023-08-19 NOTE — Progress Notes (Signed)
 Patient Care Team: Suzon Ester, NP as PCP - General (Nurse Practitioner) Alane Hsu, RN as Oncology Nurse Navigator Auther Bo, RN as Oncology Nurse Navigator Cameron Cea, MD as Medical Oncologist (Hematology and Oncology) Althea Atkinson, RPH-CPP as Pharmacist (Hematology and Oncology)  DIAGNOSIS:  Encounter Diagnoses  Name Primary?   Malignant neoplasm of lower-outer quadrant of left breast of female, estrogen receptor positive (HCC) Yes   Post-menopausal     SUMMARY OF ONCOLOGIC HISTORY: Oncology History  Malignant neoplasm of lower-outer quadrant of left breast of female, estrogen receptor positive (HCC)  06/23/2020 Initial Diagnosis   Normal mammogram September 2021.  December 2021: Following trauma to the breast she felt a lump but for variety of reasons work-up was postponed.  Mammogram revealed 2 tumors measuring 7 cm individually 4 cm and 3 cm, 3 axillary lymph nodes, biopsy revealed grade 3 IDC ER 10%, PR 0%, Ki-67 40%, HER-2 +3+ by IHC, lymph node also positive   06/28/2020 Cancer Staging   Staging form: Breast, AJCC 8th Edition - Clinical stage from 06/28/2020: Stage IIB (cT2, cN1, cM0, G3, ER+, PR-, HER2+) - Signed by Cameron Cea, MD on 06/28/2020 Stage prefix: Initial diagnosis Histologic grading system: 3 grade system   07/13/2020 - 11/16/2020 Chemotherapy   TCH Perjeta    11/30/2020 Surgery   left mastectomy: Residual IDC 0.9 cm, focal high-grade DCIS, 5/12 lymph nodes are positive including 1 micromet, margins negative, ER 10%, PR 0%, HER2 3+, Ki-67 40%   12/15/2020 - 07/13/2021 Chemotherapy   Patient is on Treatment Plan : BREAST ADO-Trastuzumab Emtansine  (Kadcyla ) q21d     12/26/2020 Cancer Staging   Staging form: Breast, AJCC 8th Edition - Pathologic stage from 12/26/2020: No Stage Recommended (ypT1c, pN2a, cM0, G3, ER+, PR-, HER2+) - Signed by Colie Dawes, MD on 12/26/2020 Stage prefix: Post-therapy Histologic grading system: 3 grade system      CHIEF COMPLIANT: Follow-up of breast history of breast cancer  HISTORY OF PRESENT ILLNESS:   History of Present Illness Janet [name] is a 58 year old female who presents for follow-up regarding a cervical polyp.  A cervical polyp was discovered during a routine Pap smear. She has no symptoms of bleeding or other issues related to the cervical polyp. She is concerned about the implications of the cervical polyp, especially given her history of breast cancer.  She is currently taking anastrozole , having switched from letrozole , and takes calcium  supplements. She completed a course of neratinib  last year. She experienced weight gain after stopping neratinib , which she attributes to the cessation of diarrhea, a side effect of the medication.  She is due for a bone density test, as her last test in 2023 showed a T-score of -1.7. She also has a Signatera test scheduled, which is transitioning from every three months to every six months.     ALLERGIES:  has no known allergies.  MEDICATIONS:  Current Outpatient Medications  Medication Sig Dispense Refill   anastrozole  (ARIMIDEX ) 1 MG tablet Take 1 tablet (1 mg total) by mouth daily. 90 tablet 3   Calcium  Carb-Cholecalciferol  (CALCIUM  500+D3) 500-10 MG-MCG TABS Take 1 tablet by mouth daily in the afternoon. 90 tablet 3   ciclopirox  (PENLAC ) 8 % solution Apply topically at bedtime. Apply over nail and surrounding skin. Apply daily over previous coat. After seven (7) days, may remove with alcohol and continue cycle. 6.6 mL 0   phenazopyridine (PYRIDIUM) 200 MG tablet as needed for nausea     No  current facility-administered medications for this visit.    PHYSICAL EXAMINATION: ECOG PERFORMANCE STATUS: 1 - Symptomatic but completely ambulatory  Vitals:   08/19/23 0913  BP: 122/84  Pulse: 72  Resp: 18  Temp: 97.9 F (36.6 C)  SpO2: 100%   Filed Weights   08/19/23 0913  Weight: 188 lb 3.2 oz (85.4 kg)    Physical Exam BREAST:  Breasts without masses or nodules.  (exam performed in the presence of a chaperone)  LABORATORY DATA:  I have reviewed the data as listed    Latest Ref Rng & Units 08/17/2022    7:36 AM 05/31/2022    9:10 AM 03/23/2022    8:52 AM  CMP  Glucose 70 - 99 mg/dL 161  92  76   BUN 6 - 20 mg/dL 20  15  13    Creatinine 0.44 - 1.00 mg/dL 0.96  0.45  4.09   Sodium 135 - 145 mmol/L 146  141  139   Potassium 3.5 - 5.1 mmol/L 5.0  4.4  5.0   Chloride 98 - 111 mmol/L 108  105  105   CO2 22 - 32 mmol/L 31  31  31    Calcium  8.9 - 10.3 mg/dL 81.1  9.8  9.8   Total Protein 6.5 - 8.1 g/dL 7.3  7.5  6.8   Total Bilirubin 0.3 - 1.2 mg/dL 0.7  0.7  0.4   Alkaline Phos 38 - 126 U/L 85  88  95   AST 15 - 41 U/L 16  24  22    ALT 0 - 44 U/L 13  21  21      Lab Results  Component Value Date   WBC 5.1 08/17/2022   HGB 13.4 08/17/2022   HCT 40.8 08/17/2022   MCV 91.3 08/17/2022   PLT 196 08/17/2022   NEUTROABS 3.1 08/17/2022    ASSESSMENT & PLAN:  Malignant neoplasm of lower-outer quadrant of left breast of female, estrogen receptor positive (HCC) 06/23/2020: Normal mammogram September 2021.  December 2021: Following trauma to the breast she felt a lump but for variety of reasons work-up was postponed.  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   Treatment Plan: 1. Neoadjuvant chemotherapy with [facility] Perjeta  6 cycles followed by Kadcyla  maintenance (because of residual disease after neoadjuvant chemo) for 1 year completed 07/13/2021 2. left mastectomy 11/30/2020: Residual IDC 0.9 cm, focal high-grade DCIS, 5/12 lymph nodes are positive including 1 micromet, margins negative, ER 10%, PR 0%, HER2 3+, Ki-67 40% 3. Followed by adjuvant radiation therapy completed 03/02/2021 4.  Followed by antiestrogen therapy started 03/30/2021 5.  Followed by neratinib  June 2023-  June 2024 MRI liver 07/11/20: Hepatic cysts Bone scan and CT CAP: No mets Breast MRI 06/28/2020: 2 large left breast masses each 0.5 cm, overall 7 cm, third contiguous mass 3 cm (could be a lymph node, abutting pectoralis muscle), 4 lymph nodes left axilla ---------------------------------------------------------------------------------------------------------------------------- Current treatment: letrozole  started 03/30/2021    Letrozole  Toxicities: Mild hot flashes and weight gain Breast cancer surveillance: Left chest wall subcutaneous nodule: Ultrasound [facility] 06/27/2022: Probably benign.  (Another ultrasound in July as planned) Mammogram and ultrasound 03/27/2023: Benign breast density category C Signatera testing: Negative    Return to clinic in 1 year for follow-up ------------------------------------- Assessment and Plan Assessment & Plan Cervical polyp Cervical polyp identified  without symptoms. Anastrozole  not a contributing factor. - Monitor for symptoms such as bleeding or discomfort.  General Health Maintenance Bone density scan due. Signatera test scheduled for this week, transitioning to biannual testing. - Schedule and complete bone density scan. - Complete Signatera test as scheduled.      Orders Placed This Encounter  Procedures   DG Bone Density    Standing Status:   Future    Expected Date:   09/18/2023    Expiration Date:   08/18/2024    Scheduling Instructions:     [facility]    Reason for Exam (SYMPTOM  OR DIAGNOSIS REQUIRED):   post menopausal             [facility]    Preferred imaging location?:   External    Release to patient:   Immediate   The patient has a good understanding of the overall plan. she agrees with it. she will call with any problems that may develop before the next visit here. Total time spent: 30 mins including face to face time and time spent for planning, charting and co-ordination of care   Margert Sheerer, MD 08/19/23

## 2023-09-02 ENCOUNTER — Encounter: Payer: Self-pay | Admitting: Hematology and Oncology

## 2023-09-03 ENCOUNTER — Telehealth: Payer: Self-pay

## 2023-09-03 NOTE — Telephone Encounter (Signed)
Called pt per MD to advise Signatera testing was negative/not detected. Pt verbalized understanding of results and knows [facility] will be in touch to schedule 3 mo repeat lab.   

## 2023-10-28 ENCOUNTER — Encounter: Payer: Self-pay | Admitting: Hematology and Oncology

## 2023-11-22 ENCOUNTER — Telehealth: Payer: Self-pay | Admitting: *Deleted

## 2023-11-22 NOTE — Telephone Encounter (Signed)
 RN placed call to pt with with recent bone density from [facility] showing t-score -1.8.  pt educated to continue OTC calcium  and vitamin D as well as weight bearing exercises.  Pt verbalized understanding.

## 2023-11-25 ENCOUNTER — Encounter: Payer: Self-pay | Admitting: Hematology and Oncology

## 2024-02-17 ENCOUNTER — Encounter: Payer: Self-pay | Admitting: Hematology and Oncology

## 2024-02-18 ENCOUNTER — Other Ambulatory Visit: Payer: Self-pay | Admitting: *Deleted

## 2024-02-18 ENCOUNTER — Encounter: Payer: Self-pay | Admitting: Hematology and Oncology

## 2024-02-18 DIAGNOSIS — C50512 Malignant neoplasm of lower-outer quadrant of left female breast: Secondary | ICD-10-CM

## 2024-03-08 LAB — SIGNATERA
SIGNATERA MTM READOUT: 0 MTM/ml
SIGNATERA TEST RESULT: NEGATIVE

## 2024-03-09 ENCOUNTER — Telehealth: Payer: Self-pay

## 2024-03-09 NOTE — Telephone Encounter (Signed)
 Enter in error

## 2024-03-30 ENCOUNTER — Encounter: Payer: Self-pay | Admitting: Hematology and Oncology

## 2024-08-18 ENCOUNTER — Inpatient Hospital Stay: Admitting: Hematology and Oncology
